# Patient Record
Sex: Female | Born: 1965
Health system: Southern US, Community
[De-identification: ages and names within clinical notes are randomized; demographics above are authoritative.]

## PROBLEM LIST (undated history)

## (undated) ENCOUNTER — Emergency Department (HOSPITAL_COMMUNITY): Admission: EM | Payer: Medicare Other

## (undated) DIAGNOSIS — K589 Irritable bowel syndrome without diarrhea: Secondary | ICD-10-CM

## (undated) DIAGNOSIS — I509 Heart failure, unspecified: Secondary | ICD-10-CM

## (undated) DIAGNOSIS — I10 Essential (primary) hypertension: Secondary | ICD-10-CM

## (undated) DIAGNOSIS — I471 Supraventricular tachycardia, unspecified: Secondary | ICD-10-CM

## (undated) DIAGNOSIS — F419 Anxiety disorder, unspecified: Secondary | ICD-10-CM

## (undated) DIAGNOSIS — F502 Bulimia nervosa, unspecified: Secondary | ICD-10-CM

## (undated) HISTORY — DX: Bulimia nervosa, unspecified: F50.20

## (undated) HISTORY — DX: Anxiety disorder, unspecified: F41.9

## (undated) HISTORY — DX: Supraventricular tachycardia, unspecified: I47.10

## (undated) HISTORY — DX: Supraventricular tachycardia: I47.1

## (undated) HISTORY — DX: Essential (primary) hypertension: I10

## (undated) HISTORY — PX: GUM SURGERY: SHX658

## (undated) HISTORY — PX: TUBAL LIGATION: SHX77

## (undated) HISTORY — DX: Irritable bowel syndrome, unspecified: K58.9

## (undated) HISTORY — DX: Bulimia nervosa: F50.2

---

## 1998-03-04 ENCOUNTER — Emergency Department (HOSPITAL_COMMUNITY): Admission: EM | Admit: 1998-03-04 | Discharge: 1998-03-05 | Payer: Self-pay

## 1998-05-02 ENCOUNTER — Emergency Department (HOSPITAL_COMMUNITY): Admission: EM | Admit: 1998-05-02 | Discharge: 1998-05-02 | Payer: Self-pay | Admitting: Emergency Medicine

## 1998-05-02 ENCOUNTER — Encounter: Payer: Self-pay | Admitting: Emergency Medicine

## 1999-09-03 ENCOUNTER — Emergency Department (HOSPITAL_COMMUNITY): Admission: EM | Admit: 1999-09-03 | Discharge: 1999-09-03 | Payer: Self-pay

## 2001-03-25 ENCOUNTER — Emergency Department (HOSPITAL_COMMUNITY): Admission: EM | Admit: 2001-03-25 | Discharge: 2001-03-26 | Payer: Self-pay | Admitting: *Deleted

## 2001-03-25 ENCOUNTER — Encounter: Payer: Self-pay | Admitting: Emergency Medicine

## 2001-10-30 ENCOUNTER — Emergency Department (HOSPITAL_COMMUNITY): Admission: EM | Admit: 2001-10-30 | Discharge: 2001-10-30 | Payer: Self-pay | Admitting: Emergency Medicine

## 2002-02-09 ENCOUNTER — Emergency Department (HOSPITAL_COMMUNITY): Admission: EM | Admit: 2002-02-09 | Discharge: 2002-02-09 | Payer: Self-pay | Admitting: Emergency Medicine

## 2002-10-14 ENCOUNTER — Encounter: Payer: Self-pay | Admitting: Emergency Medicine

## 2002-10-14 ENCOUNTER — Emergency Department (HOSPITAL_COMMUNITY): Admission: EM | Admit: 2002-10-14 | Discharge: 2002-10-15 | Payer: Self-pay | Admitting: Emergency Medicine

## 2002-11-19 ENCOUNTER — Inpatient Hospital Stay (HOSPITAL_COMMUNITY): Admission: EM | Admit: 2002-11-19 | Discharge: 2002-11-21 | Payer: Self-pay | Admitting: Emergency Medicine

## 2002-11-21 ENCOUNTER — Inpatient Hospital Stay (HOSPITAL_COMMUNITY): Admission: EM | Admit: 2002-11-21 | Discharge: 2002-11-25 | Payer: Self-pay | Admitting: Psychiatry

## 2003-02-04 ENCOUNTER — Emergency Department (HOSPITAL_COMMUNITY): Admission: EM | Admit: 2003-02-04 | Discharge: 2003-02-04 | Payer: Self-pay | Admitting: Emergency Medicine

## 2003-02-21 ENCOUNTER — Emergency Department (HOSPITAL_COMMUNITY): Admission: EM | Admit: 2003-02-21 | Discharge: 2003-02-21 | Payer: Self-pay | Admitting: Emergency Medicine

## 2005-01-16 ENCOUNTER — Emergency Department (HOSPITAL_COMMUNITY): Admission: EM | Admit: 2005-01-16 | Discharge: 2005-01-16 | Payer: Self-pay | Admitting: Emergency Medicine

## 2006-08-14 ENCOUNTER — Emergency Department (HOSPITAL_COMMUNITY): Admission: EM | Admit: 2006-08-14 | Discharge: 2006-08-14 | Payer: Self-pay | Admitting: Emergency Medicine

## 2007-11-15 ENCOUNTER — Emergency Department (HOSPITAL_COMMUNITY): Admission: EM | Admit: 2007-11-15 | Discharge: 2007-11-15 | Payer: Self-pay | Admitting: Emergency Medicine

## 2007-11-19 ENCOUNTER — Emergency Department (HOSPITAL_COMMUNITY): Admission: EM | Admit: 2007-11-19 | Discharge: 2007-11-19 | Payer: Self-pay | Admitting: Emergency Medicine

## 2008-11-07 ENCOUNTER — Observation Stay (HOSPITAL_COMMUNITY): Admission: EM | Admit: 2008-11-07 | Discharge: 2008-11-07 | Payer: Self-pay | Admitting: Emergency Medicine

## 2010-04-07 LAB — COMPREHENSIVE METABOLIC PANEL
ALT: 14 U/L (ref 0–35)
AST: 27 U/L (ref 0–37)
Albumin: 3.8 g/dL (ref 3.5–5.2)
Alkaline Phosphatase: 78 U/L (ref 39–117)
BUN: 9 mg/dL (ref 6–23)
CO2: 26 mEq/L (ref 19–32)
Calcium: 8.5 mg/dL (ref 8.4–10.5)
Chloride: 108 mEq/L (ref 96–112)
Creatinine, Ser: 0.71 mg/dL (ref 0.4–1.2)
GFR calc Af Amer: >60 mL/min (ref >60)
GFR calc non Af Amer: >60 mL/min (ref >60)
Glucose, Bld: 76 mg/dL (ref 70–99)
Potassium: 4.4 mEq/L (ref 3.5–5.1)
Sodium: 137 mEq/L (ref 135–145)
Total Bilirubin: 0.4 mg/dL (ref 0.3–1.2)
Total Protein: 6.6 g/dL (ref 6.0–8.3)

## 2010-04-07 LAB — POCT I-STAT, CHEM 8
BUN: 10 mg/dL (ref 6–23)
Calcium, Ion: 1.16 mmol/L (ref 1.12–1.32)
Glucose, Bld: 77 mg/dL (ref 70–99)
TCO2: 24 mmol/L (ref 0–100)

## 2010-04-07 LAB — POCT CARDIAC MARKERS
CKMB, poc: <1 ng/mL — ABNORMAL LOW (ref 1.0–8.0)
Myoglobin, poc: 51.7 ng/mL (ref 12–200)
Troponin i, poc: <0.05 ng/mL (ref 0.00–0.09)

## 2010-04-07 LAB — LIPASE, BLOOD: Lipase: 24 U/L (ref 11–59)

## 2010-04-07 LAB — D-DIMER, QUANTITATIVE: D-Dimer, Quant: <0.22 ug/mL-FEU (ref 0.00–0.48)

## 2010-05-21 NOTE — Discharge Summary (Signed)
Katherine Parker, HARTH NO.:  [id number]   MEDICAL RECORD NO.:  [id number]                   PATIENT TYPE:  IPS   LOCATION:  0501                                 FACILITY:  [facility]   PHYSICIAN:  Jeanice Lim, M.D.              DATE OF BIRTH:  22-Aug-1965   DATE OF ADMISSION:  11/21/2002  DATE OF DISCHARGE:  11/25/2002                                 DISCHARGE SUMMARY   IDENTIFYING DATA:  This is a 45 year old single African-American female who  presented with a history of intentional overdose on Seroquel.  Had been on  Zoloft and Seroquel and took an overdose of Zoloft 30 and Seroquel 25.  Feeling overwhelmed, financial stressors, isolated.  Intention was to kill  herself.  Sees Dr. Evelene Croon as an outpatient.  Has a long history of depression.   MEDICATIONS:  1. Seroquel.  2. Zoloft 100 mg b.i.d.   DRUG ALLERGIES:  No known drug allergies.   PHYSICAL EXAM:  Essentially within normal limits.  Neurologically nonfocal.   ROUTINE ADMISSION LABORATORIES:  Essentially within normal limits.   MENTAL STATUS:  The patient had no psychomotor abnormalities.  Fair eye  contact.  Speech was essentially within normal limits.  Mood depressed,  anxious, tearful, overwhelmed, helpless.  The patient reported being afraid  of everything.  The patient reported feeling terrible on the inside.  Reported passive suicidal ideation.  Cognition intact.  No psychotic  symptoms.  The patient was stabilized on medications including Ativan for  acute anxiety and Ambien to restore sleep.  The patient reported a positive  response.  The patient was agreeable to see a therapist and showed improved  coping skills and stabilization of mood and tolerated medication changes.  Her condition at discharge had improved, with improved coping skills.  No  dangerous ideation or psychotic symptoms.  Motivated to be compliant with  the aftercare plan and reach out for assistance with stressors.   DISCHARGE MEDICATIONS:  The patient was discharged on:  1. Geodon 60 mg b.i.d.  2. Ativan 0.5 mg, 1/2 q.a.m., 1/2 at 3 p.m., and 2 q.h.s.  3. Cymbalta 30 mg q.a.m.  4. Ambien 10 mg q.h.s. p.r.n.   DISPOSITION:  The patient was discharged to follow up with Dr. Evelene Croon on  Monday, November 30, 2002, at 9:15.   ADMITTING DIAGNOSES:  Same as admitting except global assessment of  functioning Axis V at discharge was 55.                                               Jeanice Lim, M.D.   JEM/MEDQ  D:  12/19/2002  T:  12/20/2002  Job:  [id number]

## 2010-05-21 NOTE — Discharge Summary (Signed)
Katherine Parker, Katherine Parker                             ACCOUNT NO.:  [id number]   MEDICAL RECORD NO.:  [id number]                   PATIENT TYPE:  INP   LOCATION:  0344                                 FACILITY:  [facility]   PHYSICIAN:  Jackie Plum, M.D.             DATE OF BIRTH:  August 21, 1965   DATE OF ADMISSION:  11/19/2002  DATE OF DISCHARGE:  11/20/2002                                 DISCHARGE SUMMARY   DISCHARGE DIAGNOSES:  1. Drug overdose.  2. Suicide attempt.  3. Depression.  4. Hypokalemia, ongoing treatment.   DISCHARGE MEDICATIONS:  1. Potassium 20 mEq p.o. b.i.d. for the next five days.  2. Seroquel and Zoloft, dosages per psychiatry.   CONSULTATIONS:  Psychiatry consult with Antonietta Breach, M.D.  He spent some  time today prior to discharge.   PROCEDURES:  Not applicable.   DISCHARGE LABORATORY DATA:  WBC 6.3, hemoglobin 11.3, hematocrit 34.5, MCV  80.3, platelet count 305.  Sodium 151, potassium 3.0, chloride 104, CO2 24,  glucose 99, BUN 7, creatinine 0.6.  Total bilirubin 0.7, alkaline  phosphatase 79, SGOT 17, SGPT 9, total protein 7.6, albumin 3.8, calcium  8.6.  Magnesium 1.9.  Acetaminophen level less than 10.0.  Salicylate level  less than 4.0.  Drug screen negative except for positive tricyclics.  Alcohol level 7.   Activity is as tolerated.   Diet to be regular.   REASON FOR HOSPITALIZATION:  Depression with suicide attempt/drug overdose.   The patient was admitted by Dr. Donnetta Hutching yesterday for 24-hour observation  after taking 30 tablets of Seroquel and 20 tablets of Zoloft because of some  problems with her husband.  The patient was lethargic with slurred speech at  the time of evaluation, and it was thought prudent to admit her for  observation prior to psychiatry evaluation after medical clearance.   HOSPITAL COURSE:  The patient was admitted to the hospitalist service.  She  received adequate supplementation with K-Dur for her mild hypokalemia.  Telemetry monitoring was unremarkable without any significant arrhythmias.  This morning the patient is fully awake, alert and oriented x4.  She is not  ill looking, and she does not have any complaints and she is medically  cleared for further evaluation by psychiatry.   The patient believes that her actions were wrong.  She feels that what she  did was unnecessary, and at the moment she does not have any suicidal  intent.  She has a mild hypokalemia and hyponatremia as mentioned above.  There is no indication for continued inpatient management, and she will need  a recheck of her electrolytes later on as an outpatient.  Jackie Plum, M.D.    GO/MEDQ  D:  11/20/2002  T:  11/20/2002  Job:  [id number]   cc:   Antonietta Breach, M.D.  [address]  [place], [place] [address]  Fax: [phone]   Milagros Evener, M.D.  [address] [address]  [place], [place] [address]  Fax: [phone]

## 2010-05-21 NOTE — H&P (Signed)
NAMETAWANIA, DAPONTE NO.:  [id number]   MEDICAL RECORD NO.:  [id number]                   PATIENT TYPE:  EMS   LOCATION:  ED                                   FACILITY:  [facility]   PHYSICIAN:  Sherin Quarry, MD                   DATE OF BIRTH:  1965-11-01   DATE OF ADMISSION:  11/19/2002  DATE OF DISCHARGE:                                HISTORY & PHYSICAL   HISTORY OF PRESENT ILLNESS:  Sheketa Ende is a 45 year old lady who reports a  long-standing history of depression.  The patient states that she recently  separated from her husband, and since that time has been feeling extremely  depressed and irritable.  She indicates that she has been thinking that life  is not worth living.  At about 2 p.m. today the patient took 30 Seroquel and  20 Zoloft tablets.  Her sister arrived shortly thereafter and EMS was  summoned, and the patient was brought to the [facility] Emergency Room  where she was administered activated charcoal.  Currently, she is lethargic  with slurred speech, but is alert enough to be able to answer questions  without difficulty.  She denies visual blurring, headache, shortness of  breath, cough, or chest pain.  She is admitted at this time for observation  and for management of her psychiatric problems.   ALLERGIES:  No known drug allergies.   CURRENT MEDICATIONS:  1. Seroquel 100 mg b.i.d.  2. Zoloft, which the patient states she is taking a dose of 200 mg b.i.d.   PAST MEDICAL HISTORY:  The patient states that she has a long history of  severe depression.  She states that she was hospitalized at [facility] about three years ago.   FAMILY HISTORY:  Noncontributory.   SOCIAL HISTORY:  She denies smoking or alcohol consumption.  She denies drug  abuse.   REVIEW OF SYSTEMS:  HEAD:  She denies headache or dizziness.  EYES:  She  denies visual blurring or diplopia.  EARS, NOSE, AND THROAT:  Denies  earache, sinus pain, or  sore throat.  CHEST:  Denies coughing, wheezing, or  chest congestion.  CARDIOVASCULAR:  Denies orthopnea, PND, or ankle edema.  GASTROINTESTINAL:  Denies nausea, vomiting, abdominal pain, change in bowel  habits, melena, or hematochezia.  GENITOURINARY:  Denies dysuria, urinary  frequency, hesitancy, or nocturia.  RHEUMATOLOGIC:  Denies back pain or  joint pain.  HEMATOLOGIC:  Denies easy bleeding or bruising.  NEUROLOGIC:  Denies history of seizure or stroke.   PHYSICAL EXAMINATION:  GENERAL:  She is an obese cooperative lady who is  somewhat sleepy.  VITAL SIGNS:  Blood pressure is 156/97, temperature is 99, pulse is 63,  respirations 20.  HEENT:  Within normal limits.  CHEST:  Clear.  BACK:  No CVA or point tenderness.  CARDIOVASCULAR:  Normal S1  and S2 without murmurs, rubs, or gallops.  ABDOMEN:  Benign.  Normal bowel sounds without masses, tenderness, or  organomegaly.  NEUROLOGIC:  Normal.  EXTREMITIES:  Normal.   IMPRESSION:  1. Drug overdose.  2. Depression.  3. Hypokalemia on laboratory studies.  It should be pointed out that the     patient's blood testing was negative for acetaminophen and salicylates.   PLAN:  Admit her for observation, withhold her psychiatric medications, to  replete her potassium level, and to request consultation with Dr. Jeanie Sewer  in the a.m.                                               Sherin Quarry, MD    SY/MEDQ  D:  11/19/2002  T:  11/19/2002  Job:  [id number]   cc:   Milagros Evener, M.D.  [address] [address]  [place], [place] [address]  Fax: [phone]

## 2010-10-05 LAB — POCT PREGNANCY, URINE: Preg Test, Ur: NEGATIVE

## 2010-10-05 LAB — BASIC METABOLIC PANEL
BUN: 6
Calcium: 9.4
Creatinine, Ser: 0.74
GFR calc non Af Amer: >60
Glucose, Bld: 80
Potassium: 3.2 — ABNORMAL LOW

## 2010-10-05 LAB — DIFFERENTIAL
Basophils Absolute: 0.1
Eosinophils Relative: 1
Lymphocytes Relative: 42
Monocytes Relative: 8
Neutrophils Relative %: 48

## 2010-10-05 LAB — POCT CARDIAC MARKERS
CKMB, poc: 1
Troponin i, poc: <0.05

## 2010-10-05 LAB — CBC
Platelets: 204
RDW: 15.1
WBC: 5.1

## 2017-05-22 ENCOUNTER — Other Ambulatory Visit (HOSPITAL_COMMUNITY)
Admission: RE | Admit: 2017-05-22 | Discharge: 2017-05-22 | Disposition: A | Discharge status: Home / Self Care | Payer: Medicare Other | Source: Ambulatory Visit | Attending: Family Medicine | Admitting: Family Medicine

## 2017-05-22 ENCOUNTER — Other Ambulatory Visit: Payer: Self-pay | Admitting: Family Medicine

## 2017-05-22 DIAGNOSIS — Z124 Encounter for screening for malignant neoplasm of cervix: Secondary | ICD-10-CM | POA: Insufficient documentation

## 2017-05-24 LAB — CYTOLOGY - PAP
DIAGNOSIS: NEGATIVE
HPV: NOT DETECTED

## 2017-05-31 ENCOUNTER — Other Ambulatory Visit: Payer: Self-pay | Admitting: Family Medicine

## 2017-05-31 DIAGNOSIS — Z1231 Encounter for screening mammogram for malignant neoplasm of breast: Secondary | ICD-10-CM

## 2017-07-03 ENCOUNTER — Ambulatory Visit: Payer: Medicare Other

## 2018-07-04 ENCOUNTER — Other Ambulatory Visit: Payer: Self-pay | Admitting: *Deleted

## 2018-07-04 DIAGNOSIS — Z20822 Contact with and (suspected) exposure to covid-19: Secondary | ICD-10-CM

## 2018-07-09 LAB — NOVEL CORONAVIRUS, NAA: SARS-CoV-2, NAA: NOT DETECTED

## 2018-09-22 ENCOUNTER — Other Ambulatory Visit: Payer: Self-pay

## 2018-09-22 DIAGNOSIS — Z20822 Contact with and (suspected) exposure to covid-19: Secondary | ICD-10-CM

## 2018-09-23 LAB — NOVEL CORONAVIRUS, NAA: SARS-CoV-2, NAA: NOT DETECTED

## 2018-09-26 ENCOUNTER — Telehealth: Payer: Self-pay | Admitting: General Practice

## 2018-09-26 NOTE — Telephone Encounter (Signed)
Negative COVID results given. Patient results "NOT Detected." Caller expressed understanding. ° °

## 2019-09-27 ENCOUNTER — Ambulatory Visit: Payer: Medicare Other | Admitting: Podiatry

## 2020-02-15 ENCOUNTER — Emergency Department (HOSPITAL_COMMUNITY): Payer: Medicare Other

## 2020-02-15 ENCOUNTER — Other Ambulatory Visit: Payer: Self-pay

## 2020-02-15 ENCOUNTER — Encounter (HOSPITAL_COMMUNITY): Payer: Self-pay

## 2020-02-15 ENCOUNTER — Emergency Department (HOSPITAL_COMMUNITY)
Admission: EM | Admit: 2020-02-15 | Discharge: 2020-02-16 | Disposition: A | Discharge status: Home / Self Care | Payer: Medicare Other | Attending: Emergency Medicine | Admitting: Emergency Medicine

## 2020-02-15 DIAGNOSIS — R079 Chest pain, unspecified: Secondary | ICD-10-CM | POA: Diagnosis not present

## 2020-02-15 DIAGNOSIS — R0789 Other chest pain: Secondary | ICD-10-CM | POA: Insufficient documentation

## 2020-02-15 DIAGNOSIS — R0602 Shortness of breath: Secondary | ICD-10-CM | POA: Insufficient documentation

## 2020-02-15 DIAGNOSIS — M545 Low back pain, unspecified: Secondary | ICD-10-CM | POA: Insufficient documentation

## 2020-02-15 DIAGNOSIS — K449 Diaphragmatic hernia without obstruction or gangrene: Secondary | ICD-10-CM | POA: Diagnosis not present

## 2020-02-15 DIAGNOSIS — R1013 Epigastric pain: Secondary | ICD-10-CM | POA: Insufficient documentation

## 2020-02-15 DIAGNOSIS — J9811 Atelectasis: Secondary | ICD-10-CM | POA: Diagnosis not present

## 2020-02-15 DIAGNOSIS — I517 Cardiomegaly: Secondary | ICD-10-CM | POA: Diagnosis not present

## 2020-02-15 LAB — CBC
HCT: 38.3 % (ref 36.0–46.0)
Hemoglobin: 11.8 g/dL — ABNORMAL LOW (ref 12.0–15.0)
MCH: 26.8 pg (ref 26.0–34.0)
MCHC: 30.8 g/dL (ref 30.0–36.0)
MCV: 87 fL (ref 80.0–100.0)
Platelets: 262 10*3/uL (ref 150–400)
RBC: 4.4 MIL/uL (ref 3.87–5.11)
RDW: 13.5 % (ref 11.5–15.5)
WBC: 5.8 10*3/uL (ref 4.0–10.5)
nRBC: 0 % (ref 0.0–0.2)

## 2020-02-15 LAB — BASIC METABOLIC PANEL
Anion gap: 10 (ref 5–15)
BUN: 15 mg/dL (ref 6–20)
CO2: 26 mmol/L (ref 22–32)
Calcium: 9.5 mg/dL (ref 8.9–10.3)
Chloride: 105 mmol/L (ref 98–111)
Creatinine, Ser: 0.81 mg/dL (ref 0.44–1.00)
GFR, Estimated: >60 mL/min (ref >60)
Glucose, Bld: 104 mg/dL — ABNORMAL HIGH (ref 70–99)
Potassium: 4.2 mmol/L (ref 3.5–5.1)
Sodium: 141 mmol/L (ref 135–145)

## 2020-02-15 LAB — HEPATIC FUNCTION PANEL
ALT: 8 U/L (ref 0–44)
AST: 16 U/L (ref 15–41)
Albumin: 3.7 g/dL (ref 3.5–5.0)
Alkaline Phosphatase: 79 U/L (ref 38–126)
Bilirubin, Direct: 0.1 mg/dL (ref 0.0–0.2)
Indirect Bilirubin: 0.5 mg/dL (ref 0.3–0.9)
Total Bilirubin: 0.6 mg/dL (ref 0.3–1.2)
Total Protein: 7.1 g/dL (ref 6.5–8.1)

## 2020-02-15 LAB — TROPONIN I (HIGH SENSITIVITY): Troponin I (High Sensitivity): 3 ng/L (ref <18)

## 2020-02-15 LAB — LIPASE, BLOOD: Lipase: 31 U/L (ref 11–51)

## 2020-02-15 LAB — I-STAT BETA HCG BLOOD, ED (NOT ORDERABLE): I-stat hCG, quantitative: <5 m[IU]/mL (ref <5)

## 2020-02-15 MED ORDER — PANTOPRAZOLE SODIUM 40 MG IV SOLR
40.0000 mg | Freq: Once | INTRAVENOUS | Status: AC
  Administered 2020-02-15: 40 mg via INTRAVENOUS
  Filled 2020-02-15: qty 40

## 2020-02-15 MED ORDER — KETOROLAC TROMETHAMINE 30 MG/ML IJ SOLN
30.0000 mg | Freq: Once | INTRAMUSCULAR | Status: AC
  Administered 2020-02-15: 30 mg via INTRAVENOUS
  Filled 2020-02-15: qty 1

## 2020-02-15 NOTE — ED Provider Notes (Signed)
[facility]-EMERGENCY DEPT Provider Note   CSN: [id number] Arrival date & time: 02/15/20  1911     History Chief Complaint  Patient presents with  . Chest Pain  . Shortness of Breath    Katherine Parker is a 55 y.o. female.  HPI Reports he has been having pain in her chest for about 3 days. She notes that it first she seemed to have pain in her back although she points out that it was her lower back. No radiation into her abdomen. No pain burning urgency with urination. She reports subsequent to that then she started noting pain in her chest which is not typical. She reports that she thought it might be heartburn. She reports the pain is in the center of her chest and moves out slightly to the right. She reports she has had some pain similar in the past and taken Tums infrequently. She reports this time the pain seems to be persisting a lot more and she is feeling short of breath with the pain. No fevers or cough. She does not have any pain in her legs. She reports this is been going on since she took a trip to help her mother moved to this area. She has no personal history of DVT or PE.    History reviewed. No pertinent past medical history.  There are no problems to display for this patient.   History reviewed. No pertinent surgical history.   OB History   No obstetric history on file.     History reviewed. No pertinent family history.     Home Medications Prior to Admission medications   Medication Sig Start Date End Date Taking? Authorizing Provider  albuterol (VENTOLIN HFA) 108 (90 Base) MCG/ACT inhaler Inhale 2 puffs into the lungs every 6 (six) hours as needed for wheezing or shortness of breath. 05/22/17  Yes [provider]    Allergies    Penicillins  Review of Systems   Review of Systems 10 systems reviewed and negative except as per HPI Physical Exam Updated Vital Signs BP (!) 151/93   Pulse 90   Temp 98.4 F (36.9 C) (Oral)    Resp 18   Ht 5\' 6"  (1.676 m)   Wt (!) 167.8 kg   SpO2 98%   BMI 59.72 kg/m   Physical Exam Constitutional:      Appearance: She is well-developed and well-nourished.     Comments: Patient is alert and nontoxic. No respiratory distress  HENT:     Head: Normocephalic and atraumatic.  Eyes:     Extraocular Movements: Extraocular movements intact and EOM normal.  Cardiovascular:     Rate and Rhythm: Normal rate and regular rhythm.     Pulses: Intact distal pulses.     Heart sounds: Normal heart sounds.  Pulmonary:     Effort: Pulmonary effort is normal.     Breath sounds: Normal breath sounds.     Comments: Patient does have significant tenderness to palpation along the sternal costal margins. No soft tissue abnormalities Chest:     Chest wall: Tenderness present.  Abdominal:     General: Bowel sounds are normal. There is no distension.     Palpations: Abdomen is soft.     Tenderness: There is abdominal tenderness.     Comments: Mild to moderate tenderness in the epigastrium. No guarding.  Musculoskeletal:        General: No swelling, tenderness or edema. Normal range of motion.  Cervical back: Neck supple.     Comments: No obvious edema of the lower extremities. Calves pliable  Skin:    General: Skin is warm, dry and intact.  Neurological:     General: No focal deficit present.     Mental Status: She is alert and oriented to person, place, and time.     GCS: GCS eye subscore is 4. GCS verbal subscore is 5. GCS motor subscore is 6.     Coordination: Coordination normal.     Deep Tendon Reflexes: Strength normal.  Psychiatric:        Mood and Affect: Mood and affect and mood normal.     ED Results / Procedures / Treatments   Labs (all labs ordered are listed, but only abnormal results are displayed) Labs Reviewed  BASIC METABOLIC PANEL - Abnormal; Notable for the following components:      Result Value   Glucose, Bld 104 (*)    All other components within normal  limits  CBC - Abnormal; Notable for the following components:   Hemoglobin 11.8 (*)    All other components within normal limits  LIPASE, BLOOD  HEPATIC FUNCTION PANEL  I-STAT BETA HCG BLOOD, ED ([facility], [facility], [facility] ONLY)  I-STAT BETA HCG BLOOD, ED (NOT ORDERABLE)  TROPONIN I (HIGH SENSITIVITY)    EKG EKG Interpretation  Date/Time:  Saturday February 15 2020 19:20:17 EST Ventricular Rate:  102 PR Interval:    QRS Duration: 89 QT Interval:  332 QTC Calculation: 433 R Axis:   32 Text Interpretation: Sinus tachycardia Biatrial enlargement Probable left ventricular hypertrophy similar to previous with increase appearance of LVH Confirmed by Arby Barrette [phone]) on 02/15/2020 9:56:09 PM   Radiology DG Chest 2 View  Result Date: 02/15/2020 CLINICAL DATA:  Chest pain EXAM: CHEST - 2 VIEW COMPARISON:  11/19/2007 FINDINGS: Cardiomegaly. Both lungs are clear. The visualized skeletal structures are unremarkable. IMPRESSION: Cardiomegaly without acute abnormality of the lungs. Electronically Signed   By: Lauralyn Primes M.D.   On: 02/15/2020 19:46    Procedures Procedures   Medications Ordered in ED Medications  pantoprazole (PROTONIX) injection 40 mg (40 mg Intravenous Given 02/15/20 2310)  ketorolac (TORADOL) 30 MG/ML injection 30 mg (30 mg Intravenous Given 02/15/20 2309)    ED Course  I have reviewed the triage vital signs and the nursing notes.  Pertinent labs & imaging results that were available during my care of the patient were reviewed by me and considered in my medical decision making (see chart for details).    MDM Rules/Calculators/A&P                          Patient presents with about 3 days of chest pain. She does note associated shortness of breath. No fevers or chills. Pain also has a quality of burning in the epigastrium. We will proceed with cardiac work-up and GI work-up. EKG does not show STEMI pattern. Initially documented blood pressure significantly elevated however  this was repeated in the room with manual cuff with borderline hypertension but not significantly elevated. Given associated shortness of breath and some association of symptoms since a episode of travel, if no GI findings of elevated lipase or hepatic function plan to proceed with CT PE study. Patient was given Toradol and Protonix. She is rechecked and feeling improvement. If all diagnostic study without abnormality, anticipate discharge with close follow-up with PCP. Would start patient on PPI. Final Clinical Impression(s) / ED Diagnoses  Final diagnoses:  Atypical chest pain    Rx / DC Orders ED Discharge Orders    None       Arby Barrette, MD 02/15/20 2359

## 2020-02-15 NOTE — ED Triage Notes (Signed)
Pt reports chest pain and SHOB x3 days. Pt states the pain began in her back and no radiates down her arms. Pt also reports some diarrhea. Pt denies N/V and abdominal pain.

## 2020-02-16 ENCOUNTER — Encounter (HOSPITAL_COMMUNITY): Payer: Self-pay

## 2020-02-16 ENCOUNTER — Emergency Department (HOSPITAL_COMMUNITY): Payer: Medicare Other

## 2020-02-16 DIAGNOSIS — K449 Diaphragmatic hernia without obstruction or gangrene: Secondary | ICD-10-CM | POA: Diagnosis not present

## 2020-02-16 DIAGNOSIS — J9811 Atelectasis: Secondary | ICD-10-CM | POA: Diagnosis not present

## 2020-02-16 DIAGNOSIS — I517 Cardiomegaly: Secondary | ICD-10-CM | POA: Diagnosis not present

## 2020-02-16 MED ORDER — IOHEXOL 350 MG/ML SOLN
100.0000 mL | Freq: Once | INTRAVENOUS | Status: AC | PRN
  Administered 2020-02-16: 100 mL via INTRAVENOUS

## 2020-02-16 NOTE — ED Provider Notes (Signed)
89 yoF with a chief complaint of chest pain.  I received the patient in signout from Dr. Clarice Pole.  Concern for PE plan for CT angiogram of the chest.  If negative likely discharge home with PCP follow-up.  CTa negative for PE.  Signs of heart failure both right and left sided.  No concern for an acute heart failure exacerbation on my exam.  We will have the patient follow-up with her family doctor.   Melene Plan, DO 02/16/20 [phone]

## 2020-02-16 NOTE — Discharge Instructions (Signed)
There was no blood clot seen in your lung.  Follow up with your family doctor.  There were signs of heart failure on CT.  This is something that needs to be follow up as an outpatient.  Please discuss with your family doc.   Try pepcid or tagamet up to twice a day.  Try to avoid things that may make this worse, most commonly these are spicy foods tomato based products fatty foods chocolate and peppermint.  Alcohol and tobacco can also make this worse.  Return to the emergency department for sudden worsening pain fever or inability to eat or drink.

## 2020-03-09 ENCOUNTER — Ambulatory Visit: Payer: Medicare Other | Admitting: Podiatry

## 2020-03-09 ENCOUNTER — Other Ambulatory Visit: Payer: Self-pay

## 2020-03-09 ENCOUNTER — Encounter: Payer: Self-pay | Admitting: Podiatry

## 2020-03-09 DIAGNOSIS — E785 Hyperlipidemia, unspecified: Secondary | ICD-10-CM | POA: Insufficient documentation

## 2020-03-09 DIAGNOSIS — M2142 Flat foot [pes planus] (acquired), left foot: Secondary | ICD-10-CM

## 2020-03-09 DIAGNOSIS — G4700 Insomnia, unspecified: Secondary | ICD-10-CM | POA: Insufficient documentation

## 2020-03-09 DIAGNOSIS — J4520 Mild intermittent asthma, uncomplicated: Secondary | ICD-10-CM | POA: Insufficient documentation

## 2020-03-09 DIAGNOSIS — J45909 Unspecified asthma, uncomplicated: Secondary | ICD-10-CM | POA: Insufficient documentation

## 2020-03-09 DIAGNOSIS — E7800 Pure hypercholesterolemia, unspecified: Secondary | ICD-10-CM | POA: Insufficient documentation

## 2020-03-09 DIAGNOSIS — B351 Tinea unguium: Secondary | ICD-10-CM | POA: Diagnosis not present

## 2020-03-09 DIAGNOSIS — L853 Xerosis cutis: Secondary | ICD-10-CM

## 2020-03-09 DIAGNOSIS — M2141 Flat foot [pes planus] (acquired), right foot: Secondary | ICD-10-CM | POA: Diagnosis not present

## 2020-03-09 DIAGNOSIS — M79674 Pain in right toe(s): Secondary | ICD-10-CM | POA: Diagnosis not present

## 2020-03-09 DIAGNOSIS — M79675 Pain in left toe(s): Secondary | ICD-10-CM

## 2020-03-09 DIAGNOSIS — F331 Major depressive disorder, recurrent, moderate: Secondary | ICD-10-CM | POA: Insufficient documentation

## 2020-03-09 NOTE — Patient Instructions (Signed)
Apply Vick's Vapor Rub to toenails once daily.  For normal skin: Moisturize feet once daily; do not apply between toes A.  CeraVe Daily Moisturizing Lotion B.  Vaseline Intensive Care Lotion C.  Lubriderm Lotion D.  Gold Bond Diabetic Foot Lotion E.  Eucerin Intensive Repair Moisturizing Lotion  For extremely dry, cracked feet: moisturize feet once daily; do not apply between toes A. CeraVe Healing Ointment B. Aquaphor Healing Ointment C. Vaseline Petroleum Healing Jelly   If you have problems reaching your feet: apply to feet once daily; do not apply between toes A.  Aquaphor Advanced Therapy Ointment Body Spray B.  Vaseline Intensive Care Spray Lotion Advanced Repair

## 2020-03-15 ENCOUNTER — Encounter: Payer: Self-pay | Admitting: Podiatry

## 2020-03-15 NOTE — Progress Notes (Signed)
Subjective: Katherine Parker presents today referred by [facility] [place] for complaint of painful thick toenails that are difficult to trim. Pain interferes with ambulation. Aggravating factors include wearing enclosed shoe gear. Pain is relieved with periodic professional debridement..   History reviewed. No pertinent past medical history.   Patient Active Problem List   Diagnosis Date Noted  . Asthma 03/09/2020  . Hypercholesterolemia 03/09/2020  . Insomnia 03/09/2020  . Mild intermittent asthma 03/09/2020  . Moderate recurrent major depression ([place]) 03/09/2020     History reviewed. No pertinent surgical history.   Current Outpatient Medications on File Prior to Visit  Medication Sig Dispense Refill  . benzonatate (TESSALON PERLES) 100 MG capsule 1 capsule as needed    . buPROPion (WELLBUTRIN XL) 150 MG 24 hr tablet 1 tablet in the morning    . cetirizine (ZYRTEC ALLERGY) 10 MG tablet 1 tablet    . hydrOXYzine (ATARAX/VISTARIL) 25 MG tablet 1 tablet as needed    . triamcinolone (KENALOG) 0.1 % 1 application    . albuterol (VENTOLIN HFA) 108 (90 Base) MCG/ACT inhaler Inhale 2 puffs into the lungs every 6 (six) hours as needed for wheezing or shortness of breath.    Marland Kitchen COVID-19 Specimen Collection KIT TEST AS DIRECTED TODAY    . dextromethorphan (DELSYM) 30 MG/5ML liquid 10 ml as needed     No current facility-administered medications on file prior to visit.     Allergies  Allergen Reactions  . Penicillin G     Other reaction(s): Unknown  . Penicillins     Did it involve swelling of the face/tongue/throat, SOB, or low BP? Unknown Did it involve sudden or severe rash/hives, skin peeling, or any reaction on the inside of your mouth or nose? Unknown Did you need to seek medical attention at a hospital or doctor's office? Unknown When did it last happen?Childhood If all above answers are "NO", may proceed with cephalosporin use.       Social History    Occupational History  . Not on file  Tobacco Use  . Smoking status: Never Smoker  . Smokeless tobacco: Never Used  Substance and Sexual Activity  . Alcohol use: Not Currently  . Drug use: Not on file  . Sexual activity: Not on file     History reviewed. No pertinent family history.    There is no immunization history on file for this patient.   Objective: Katherine Parker is a pleasant 55 y.o. female in NAD. AAO x 3.  There were no vitals filed for this visit.  Vascular Examination:  Capillary refill time to digits immediate b/l. Palpable pedal pulses b/l LE. Pedal hair sparse. Lower extremity skin temperature gradient within normal limits.  Dermatological Examination: Pedal skin with normal turgor, texture and tone bilaterally. No open wounds bilaterally. No interdigital macerations bilaterally. Toenails 1-5 b/l elongated, discolored, dystrophic, thickened, crumbly with subungual debris and tenderness to dorsal palpation.  Musculoskeletal: Normal muscle strength 5/5 to all lower extremity muscle groups bilaterally. No pain crepitus or joint limitation noted with ROM b/l. Pes planus deformity noted b/l.   Neurological: Protective sensation intact 5/5 intact bilaterally with 10g monofilament b/l. Vibratory sensation intact b/l.  Assessment: 1. Pain due to onychomycosis of toenails of both feet   2. Xerosis cutis   3. Pes planus of both feet     Plan: -Examined patient. -Patient to continue soft, supportive shoe gear daily. -Discussed treatment options for onychomycosis. Patient opted for topical OTC therapy. Patient  is to apply Vick's Vapor Rub to affected toenail(s) once daily. -Toenails 1-5 b/l were debrided in length and girth with sterile nail nippers and dremel without iatrogenic bleeding.  -Patient to report any pedal injuries to medical professional immediately. -For dry skin, patient was given written list of OTC moisturizers. Patient/POA instructed to apply to  foot/feet once daily avoiding application between toes.  -Patient/POA to call should there be question/concern in the interim.  Return in about 3 months (around 06/09/2020).  Marzetta Board, DPM

## 2020-06-04 ENCOUNTER — Other Ambulatory Visit: Payer: Self-pay

## 2020-06-05 ENCOUNTER — Ambulatory Visit (INDEPENDENT_AMBULATORY_CARE_PROVIDER_SITE_OTHER): Payer: Medicare Other | Admitting: Family Medicine

## 2020-06-05 ENCOUNTER — Encounter: Payer: Self-pay | Admitting: Family Medicine

## 2020-06-05 VITALS — BP 130/90 | HR 85 | Temp 98.1°F | Ht 66.0 in | Wt 340.4 lb

## 2020-06-05 DIAGNOSIS — R6889 Other general symptoms and signs: Secondary | ICD-10-CM

## 2020-06-05 DIAGNOSIS — R5383 Other fatigue: Secondary | ICD-10-CM

## 2020-06-05 DIAGNOSIS — Z6841 Body Mass Index (BMI) 40.0 and over, adult: Secondary | ICD-10-CM | POA: Diagnosis not present

## 2020-06-05 DIAGNOSIS — I517 Cardiomegaly: Secondary | ICD-10-CM | POA: Diagnosis not present

## 2020-06-05 DIAGNOSIS — M549 Dorsalgia, unspecified: Secondary | ICD-10-CM

## 2020-06-05 MED ORDER — TIZANIDINE HCL 4 MG PO CAPS: 4.0000 mg | ORAL_CAPSULE | Freq: Three times a day (TID) | ORAL | 0 refills | Status: DC | PRN

## 2020-06-05 MED ORDER — NAPROXEN 500 MG PO TABS: 500.0000 mg | ORAL_TABLET | Freq: Two times a day (BID) | ORAL | 2 refills | Status: DC

## 2020-06-05 NOTE — Progress Notes (Signed)
Katherine Parker is a 55 y.o. female  Chief Complaint  Patient presents with  . Establish Care    Np c/o back pain.  Pt also said the she went to Hospital in Feb and was told she had CHF and wanted to discuss that dx.  Pt wants to discuss bypass surgery.    HPI: Katherine Parker is a 55 y.o. female patient seen today to establish care. She has multiple concerns today: 1. BMI above 50. She would like referral to gastric surgery.  2. Was told in [place] that she had an enlarged heart. She was then seen in ER in 02/2020 for chest pain and r/o PE. CTA showed dilated chambers and dilated pulm artery. Pt endorses LE edema. + fatigue, decreased exercise tolerance. No SOB at rest. No orthopnea.  Had covid in 03/2019 and was told she needed to be hospitalized but pt did not do so because she was caring for her mother.   3. Pt with intermittent low back pain x 1 year. Sharp, unbearable pain. No radiation of pain and no LE numbness or paresthesias. 2 episodes in the past year. She bends forward or leans sideways. Lasts 3-4 days then eases off. She takes motrin or ibuprofen.    History reviewed. No pertinent past medical history.  Past Surgical History:  Procedure Laterality Date  . TUBAL LIGATION      Social History   Socioeconomic History  . Marital status: Single    Spouse name: Not on file  . Number of children: Not on file  . Years of education: Not on file  . Highest education level: Not on file  Occupational History  . Not on file  Tobacco Use  . Smoking status: Never Smoker  . Smokeless tobacco: Never Used  Vaping Use  . Vaping Use: Never used  Substance and Sexual Activity  . Alcohol use: Not Currently  . Drug use: Not on file  . Sexual activity: Not on file  Other Topics Concern  . Not on file  Social History Narrative  . Not on file   Social Determinants of Health   Financial Resource Strain: Not on file  Food Insecurity: Not on file  Transportation Needs: Not on file   Physical Activity: Not on file  Stress: Not on file  Social Connections: Not on file  Intimate Partner Violence: Not on file    Family History  Problem Relation Age of Onset  . COPD Mother   . Congestive Heart Failure Mother   . Hypertension Mother   . Diabetes Mother   . Colon cancer Maternal Grandmother       There is no immunization history on file for this patient.  Outpatient Encounter Medications as of 06/05/2020  Medication Sig  . albuterol (VENTOLIN HFA) 108 (90 Base) MCG/ACT inhaler Inhale 2 puffs into the lungs every 6 (six) hours as needed for wheezing or shortness of breath.  . benzonatate (TESSALON) 100 MG capsule 1 capsule as needed (Patient not taking: Reported on 06/05/2020)  . buPROPion (WELLBUTRIN XL) 150 MG 24 hr tablet 1 tablet in the morning (Patient not taking: Reported on 06/05/2020)  . cetirizine (ZYRTEC) 10 MG tablet 1 tablet (Patient not taking: Reported on 06/05/2020)  . COVID-19 Specimen Collection KIT TEST AS DIRECTED TODAY (Patient not taking: Reported on 06/05/2020)  . dextromethorphan (DELSYM) 30 MG/5ML liquid 10 ml as needed (Patient not taking: Reported on 06/05/2020)  . hydrOXYzine (ATARAX/VISTARIL) 25 MG tablet 1 tablet as needed (Patient not  taking: Reported on 06/05/2020)  . triamcinolone (KENALOG) 0.1 % 1 application (Patient not taking: Reported on 06/05/2020)   No facility-administered encounter medications on file as of 06/05/2020.     ROS: Pertinent positives and negatives noted in HPI. Remainder of ROS non-contributory    Allergies  Allergen Reactions  . Penicillin G     Other reaction(s): Unknown  . Penicillins     Did it involve swelling of the face/tongue/throat, SOB, or low BP? Unknown Did it involve sudden or severe rash/hives, skin peeling, or any reaction on the inside of your mouth or nose? Unknown Did you need to seek medical attention at a hospital or doctor's office? Unknown When did it last happen?Childhood If all above  answers are "NO", may proceed with cephalosporin use.      BP 130/90 (BP Location: Left Arm, Patient Position: Sitting, Cuff Size: Large)   Pulse 85   Temp 98.1 F (36.7 C) (Temporal)   Ht _0  (1.676 m)   Wt (!) 340 lb 6.4 oz (154.4 kg)   SpO2 98%   BMI 54.94 kg/m   Wt Readings from Last 3 Encounters:  06/05/20 (!) 340 lb 6.4 oz (154.4 kg)  02/15/20 (!) 370 lb (167.8 kg)   Temp Readings from Last 3 Encounters:  06/05/20 98.1 F (36.7 C) (Temporal)  02/16/20 98 F (36.7 C) (Oral)   BP Readings from Last 3 Encounters:  06/05/20 130/90  02/16/20 (!) 155/97   Pulse Readings from Last 3 Encounters:  06/05/20 85  02/16/20 72     Physical Exam Constitutional:      General: She is not in acute distress.    Appearance: She is obese. She is not ill-appearing.  Cardiovascular:     Rate and Rhythm: Normal rate and regular rhythm.     Pulses: Normal pulses.  Pulmonary:     Effort: Pulmonary effort is normal. No respiratory distress.     Breath sounds: Normal breath sounds.  Neurological:     Mental Status: She is alert and oriented to person, place, and time.  Psychiatric:        Mood and Affect: Mood normal.        Behavior: Behavior normal.      A/P:  1. Cardiomegaly 2. Decreased exercise tolerance 3. Fatigue, unspecified type - ECHOCARDIOGRAM COMPLETE; Future  4. BMI 50.0-59.9, adult ([place]) 5. Morbid obesity ([place]) - Amb Referral to Bariatric Surgery  6. Musculoskeletal back pain - 2 episodes in the past 1 year, lasting 3-4 days each - recommend daily exercises to help strengthen lower back. Reviewed with pt and included in AVS - heat PRN Rx: - naproxen (NAPROSYN) 500 MG tablet; Take 1 tablet (500 mg total) by mouth 2 (two) times daily with a meal.  Dispense: 60 tablet; Refill: 2 - tiZANidine (ZANAFLEX) 4 MG capsule; Take 1 capsule (4 mg total) by mouth 3 (three) times daily as needed for muscle spasms.  Dispense: 60 capsule; Refill: 0 Discussed plan and  reviewed medications with patient, including risks, benefits, and potential side effects. Pt expressed understand. All questions answered.    This visit occurred during the SARS-CoV-2 public health emergency.  Safety protocols were in place, including screening questions prior to the visit, additional usage of staff PPE, and extensive cleaning of exam room while observing appropriate contact time as indicated for disinfecting solutions.

## 2020-06-05 NOTE — Patient Instructions (Addendum)
Low back exercises - daily Heating pad 2-3x/day 15-67min Naproxen 500mg  1 tab 2x/day with food x 3-7 days zanaflex 1 tab 3x/day as needed  Low Back Sprain or Strain Rehab Ask your health care provider which exercises are safe for you. Do exercises exactly as told by your health care provider and adjust them as directed. It is normal to feel mild stretching, pulling, tightness, or discomfort as you do these exercises. Stop right away if you feel sudden pain or your pain gets worse. Do not begin these exercises until told by your health care provider. Stretching and range-of-motion exercises These exercises warm up your muscles and joints and improve the movement and flexibility of your back. These exercises also help to relieve pain, numbness, and tingling. Lumbar rotation 1. Lie on your back on a firm surface and bend your knees. 2. Straighten your arms out to your sides so each arm forms a 90-degree angle (right angle) with a side of your body. 3. Slowly move (rotate) both of your knees to one side of your body until you feel a stretch in your lower back (lumbar). Try not to let your shoulders lift off the floor. 4. Hold this position for __________ seconds. 5. Tense your abdominal muscles and slowly move your knees back to the starting position. 6. Repeat this exercise on the other side of your body. Repeat __________ times. Complete this exercise __________ times a day.   Single knee to chest 1. Lie on your back on a firm surface with both legs straight. 2. Bend one of your knees. Use your hands to move your knee up toward your chest until you feel a gentle stretch in your lower back and buttock. ? Hold your leg in this position by holding on to the front of your knee. ? Keep your other leg as straight as possible. 3. Hold this position for __________ seconds. 4. Slowly return to the starting position. 5. Repeat with your other leg. Repeat __________ times. Complete this exercise  __________ times a day.   Prone extension on elbows 1. Lie on your abdomen on a firm surface (prone position). 2. Prop yourself up on your elbows. 3. Use your arms to help lift your chest up until you feel a gentle stretch in your abdomen and your lower back. ? This will place some of your body weight on your elbows. If this is uncomfortable, try stacking pillows under your chest. ? Your hips should stay down, against the surface that you are lying on. Keep your hip and back muscles relaxed. 4. Hold this position for __________ seconds. 5. Slowly relax your upper body and return to the starting position. Repeat __________ times. Complete this exercise __________ times a day.   Strengthening exercises These exercises build strength and endurance in your back. Endurance is the ability to use your muscles for a long time, even after they get tired. Pelvic tilt This exercise strengthens the muscles that lie deep in the abdomen. 1. Lie on your back on a firm surface. Bend your knees and keep your feet flat on the floor. 2. Tense your abdominal muscles. Tip your pelvis up toward the ceiling and flatten your lower back into the floor. ? To help with this exercise, you may place a small towel under your lower back and try to push your back into the towel. 3. Hold this position for __________ seconds. 4. Let your muscles relax completely before you repeat this exercise. Repeat __________ times. Complete this exercise  __________ times a day. Alternating arm and leg raises 1. Get on your hands and knees on a firm surface. If you are on a hard floor, you may want to use padding, such as an exercise mat, to cushion your knees. 2. Line up your arms and legs. Your hands should be directly below your shoulders, and your knees should be directly below your hips. 3. Lift your left leg behind you. At the same time, raise your right arm and straighten it in front of you. ? Do not lift your leg higher than your  hip. ? Do not lift your arm higher than your shoulder. ? Keep your abdominal and back muscles tight. ? Keep your hips facing the ground. ? Do not arch your back. ? Keep your balance carefully, and do not hold your breath. 4. Hold this position for __________ seconds. 5. Slowly return to the starting position. 6. Repeat with your right leg and your left arm. Repeat __________ times. Complete this exercise __________ times a day.   Abdominal set with straight leg raise 1. Lie on your back on a firm surface. 2. Bend one of your knees and keep your other leg straight. 3. Tense your abdominal muscles and lift your straight leg up, 4-6 inches (10-15 cm) off the ground. 4. Keep your abdominal muscles tight and hold this position for __________ seconds. ? Do not hold your breath. ? Do not arch your back. Keep it flat against the ground. 5. Keep your abdominal muscles tense as you slowly lower your leg back to the starting position. 6. Repeat with your other leg. Repeat __________ times. Complete this exercise __________ times a day.   Single leg lower with bent knees 1. Lie on your back on a firm surface. 2. Tense your abdominal muscles and lift your feet off the floor, one foot at a time, so your knees and hips are bent in 90-degree angles (right angles). ? Your knees should be over your hips and your lower legs should be parallel to the floor. 3. Keeping your abdominal muscles tense and your knee bent, slowly lower one of your legs so your toe touches the ground. 4. Lift your leg back up to return to the starting position. ? Do not hold your breath. ? Do not let your back arch. Keep your back flat against the ground. 5. Repeat with your other leg. Repeat __________ times. Complete this exercise __________ times a day. Posture and body mechanics Good posture and healthy body mechanics can help to relieve stress in your body's tissues and joints. Body mechanics refers to the movements and  positions of your body while you do your daily activities. Posture is part of body mechanics. Good posture means:  Your spine is in its natural S-curve position (neutral).  Your shoulders are pulled back slightly.  Your head is not tipped forward. Follow these guidelines to improve your posture and body mechanics in your everyday activities. Standing  When standing, keep your spine neutral and your feet about hip width apart. Keep a slight bend in your knees. Your ears, shoulders, and hips should line up.  When you do a task in which you stand in one place for a long time, place one foot up on a stable object that is 2-4 inches (5-10 cm) high, such as a footstool. This helps keep your spine neutral.   Sitting  When sitting, keep your spine neutral and keep your feet flat on the floor. Use a footrest, if  necessary, and keep your thighs parallel to the floor. Avoid rounding your shoulders, and avoid tilting your head forward.  When working at a desk or a computer, keep your desk at a height where your hands are slightly lower than your elbows. Slide your chair under your desk so you are close enough to maintain good posture.  When working at a computer, place your monitor at a height where you are looking straight ahead and you do not have to tilt your head forward or downward to look at the screen.   Resting  When lying down and resting, avoid positions that are most painful for you.  If you have pain with activities such as sitting, bending, stooping, or squatting, lie in a position in which your body does not bend very much. For example, avoid curling up on your side with your arms and knees near your chest (fetal position).  If you have pain with activities such as standing for a long time or reaching with your arms, lie with your spine in a neutral position and bend your knees slightly. Try the following positions: ? Lying on your side with a pillow between your knees. ? Lying on your  back with a pillow under your knees. Lifting  When lifting objects, keep your feet at least shoulder width apart and tighten your abdominal muscles.  Bend your knees and hips and keep your spine neutral. It is important to lift using the strength of your legs, not your back. Do not lock your knees straight out.  Always ask for help to lift heavy or awkward objects.   This information is not intended to replace advice given to you by your health care provider. Make sure you discuss any questions you have with your health care provider. Document Revised: 04/13/2018 Document Reviewed: 01/11/2018 Elsevier Patient Education  2021 [employer].

## 2020-06-19 ENCOUNTER — Ambulatory Visit (INDEPENDENT_AMBULATORY_CARE_PROVIDER_SITE_OTHER): Payer: Medicare Other | Admitting: Podiatry

## 2020-06-19 ENCOUNTER — Other Ambulatory Visit: Payer: Self-pay

## 2020-06-19 DIAGNOSIS — M79675 Pain in left toe(s): Secondary | ICD-10-CM

## 2020-06-19 DIAGNOSIS — M79674 Pain in right toe(s): Secondary | ICD-10-CM | POA: Diagnosis not present

## 2020-06-19 DIAGNOSIS — B351 Tinea unguium: Secondary | ICD-10-CM | POA: Diagnosis not present

## 2020-06-22 ENCOUNTER — Ambulatory Visit (HOSPITAL_COMMUNITY)
Admission: RE | Admit: 2020-06-22 | Discharge: 2020-06-22 | Disposition: A | Discharge status: Home / Self Care | Payer: Medicare Other | Source: Ambulatory Visit | Attending: Family Medicine | Admitting: Family Medicine

## 2020-06-22 ENCOUNTER — Other Ambulatory Visit: Payer: Self-pay

## 2020-06-22 DIAGNOSIS — I517 Cardiomegaly: Secondary | ICD-10-CM | POA: Insufficient documentation

## 2020-06-22 DIAGNOSIS — R6889 Other general symptoms and signs: Secondary | ICD-10-CM

## 2020-06-22 DIAGNOSIS — R5383 Other fatigue: Secondary | ICD-10-CM | POA: Diagnosis not present

## 2020-06-22 LAB — ECHOCARDIOGRAM COMPLETE
Area-P 1/2: 3.83 cm2
S' Lateral: 3.1 cm

## 2020-06-25 ENCOUNTER — Encounter: Payer: Self-pay | Admitting: Podiatry

## 2020-06-25 NOTE — Progress Notes (Signed)
  Subjective:  Patient ID: Katherine Parker, female    DOB: 08-31-1965,  MRN: [id number]  55 y.o. female presents painful thick toenails that are difficult to trim. Pain interferes with ambulation. Aggravating factors include wearing enclosed shoe gear. Pain is relieved with periodic professional debridement.  Patient was using Vick's Vapor Rub to toenails, but would like to discuss other treatments available for onychomycosis.   Allergies  Allergen Reactions   Penicillin G     Other reaction(s): Unknown   Penicillins     Did it involve swelling of the face/tongue/throat, SOB, or low BP? Unknown Did it involve sudden or severe rash/hives, skin peeling, or any reaction on the inside of your mouth or nose? Unknown Did you need to seek medical attention at a hospital or doctor's office? Unknown When did it last happen?  Childhood    If all above answers are "NO", may proceed with cephalosporin use.      Review of Systems: Negative except as noted in the HPI.   Objective:   Constitutional Pt is a pleasant 55 y.o. African American female morbidly obese in NAD. AAO x 3.   Vascular Capillary refill time to digits immediate b/l. Palpable DP pulse(s) b/l lower extremities Palpable PT pulse(s) b/l lower extremities Pedal hair sparse. Lower extremity skin temperature gradient within normal limits.  Neurologic Protective sensation intact 5/5 intact bilaterally with 10g monofilament b/l.  Dermatologic Toenails 1-5 b/l elongated, discolored, dystrophic, thickened, crumbly with subungual debris and tenderness to dorsal palpation.  Orthopedic: Normal muscle strength 5/5 to all lower extremity muscle groups bilaterally. No pain crepitus or joint limitation noted with ROM b/l. Pes planus deformity noted b/l.    Radiographs: None Assessment:   1. Pain due to onychomycosis of toenails of both feet    Plan:  Patient was evaluated and treated and all questions answered.  Onychomycosis with pain -Nails  palliatively debridement as below. -Educated on self-care  Procedure: Nail Debridement Rationale: Pain Type of Debridement: manual, sharp debridement. Instrumentation: Nail nipper, rotary burr. Number of Nails: 10  -Examined patient. -Patient to continue soft, supportive shoe gear daily. -Discussed topical, laser and oral medication. Patient opted for topical treatment with compounded medication. Rx written for nonformulary compounding topical antifungal: [facility]: Antifungal cream - Terbinafine 3%, Fluconazole 2%, Tea Tree Oil 5%, Urea 10%, Ibuprofen 2% in DMSO Suspension #67ml. Apply to the affected nail(s) once daily. -Toenails 1-5 b/l were debrided in length and girth with sterile nail nippers and dremel without iatrogenic bleeding.  -Patient to report any pedal injuries to medical professional immediately. -Patient/POA to call should there be question/concern in the interim.  No follow-ups on file.  Freddie Breech, DPM

## 2020-07-02 ENCOUNTER — Telehealth: Payer: Self-pay

## 2020-07-02 NOTE — Telephone Encounter (Signed)
Echo is overall normal with normal heart function and valves.

## 2020-07-02 NOTE — Telephone Encounter (Signed)
Pt called to ask about her echo results.  I informed pt that I would send her message to Dr. Salena Saner for advise.  CB#[phone]

## 2020-07-08 NOTE — Telephone Encounter (Signed)
Left message on voicemail to call office.  

## 2020-07-09 NOTE — Telephone Encounter (Signed)
Notified patient of lab results.  Patient verbalized understanding.  

## 2020-08-22 ENCOUNTER — Emergency Department (HOSPITAL_COMMUNITY)
Admission: EM | Admit: 2020-08-22 | Discharge: 2020-08-23 | Disposition: A | Discharge status: Home / Self Care | Payer: Medicare Other | Attending: Emergency Medicine | Admitting: Emergency Medicine

## 2020-08-22 ENCOUNTER — Emergency Department (HOSPITAL_COMMUNITY): Payer: Medicare Other

## 2020-08-22 ENCOUNTER — Other Ambulatory Visit: Payer: Self-pay

## 2020-08-22 DIAGNOSIS — R0789 Other chest pain: Secondary | ICD-10-CM

## 2020-08-22 DIAGNOSIS — J4520 Mild intermittent asthma, uncomplicated: Secondary | ICD-10-CM | POA: Diagnosis not present

## 2020-08-22 DIAGNOSIS — I10 Essential (primary) hypertension: Secondary | ICD-10-CM

## 2020-08-22 LAB — HEPATIC FUNCTION PANEL
ALT: 7 U/L (ref 0–44)
AST: 16 U/L (ref 15–41)
Albumin: 3.7 g/dL (ref 3.5–5.0)
Alkaline Phosphatase: 81 U/L (ref 38–126)
Bilirubin, Direct: 0.1 mg/dL (ref 0.0–0.2)
Indirect Bilirubin: 0.4 mg/dL (ref 0.3–0.9)
Total Bilirubin: 0.5 mg/dL (ref 0.3–1.2)
Total Protein: 7.3 g/dL (ref 6.5–8.1)

## 2020-08-22 LAB — HCG, QUANTITATIVE, PREGNANCY: hCG, Beta Chain, Quant, S: 3 m[IU]/mL (ref <5)

## 2020-08-22 LAB — CBC
HCT: 36.8 % (ref 36.0–46.0)
Hemoglobin: 11.2 g/dL — ABNORMAL LOW (ref 12.0–15.0)
MCH: 27.1 pg (ref 26.0–34.0)
MCHC: 30.4 g/dL (ref 30.0–36.0)
MCV: 89.1 fL (ref 80.0–100.0)
Platelets: 254 10*3/uL (ref 150–400)
RBC: 4.13 MIL/uL (ref 3.87–5.11)
RDW: 13.7 % (ref 11.5–15.5)
WBC: 6.3 10*3/uL (ref 4.0–10.5)
nRBC: 0 % (ref 0.0–0.2)

## 2020-08-22 LAB — BASIC METABOLIC PANEL
Anion gap: 6 (ref 5–15)
BUN: 18 mg/dL (ref 6–20)
CO2: 28 mmol/L (ref 22–32)
Calcium: 9 mg/dL (ref 8.9–10.3)
Chloride: 106 mmol/L (ref 98–111)
Creatinine, Ser: 0.57 mg/dL (ref 0.44–1.00)
GFR, Estimated: >60 mL/min (ref >60)
Glucose, Bld: 99 mg/dL (ref 70–99)
Potassium: 4.6 mmol/L (ref 3.5–5.1)
Sodium: 140 mmol/L (ref 135–145)

## 2020-08-22 LAB — BRAIN NATRIURETIC PEPTIDE: B Natriuretic Peptide: 21.5 pg/mL (ref 0.0–100.0)

## 2020-08-22 LAB — TROPONIN I (HIGH SENSITIVITY): Troponin I (High Sensitivity): 4 ng/L (ref <18)

## 2020-08-22 MED ORDER — LISINOPRIL 20 MG PO TABS: 20.0000 mg | ORAL_TABLET | Freq: Every day | ORAL | 0 refills | Status: DC

## 2020-08-22 MED ORDER — HYDRALAZINE HCL 20 MG/ML IJ SOLN
5.0000 mg | Freq: Once | INTRAMUSCULAR | Status: AC
  Administered 2020-08-22: 5 mg via INTRAVENOUS
  Filled 2020-08-22: qty 1

## 2020-08-22 NOTE — ED Provider Notes (Signed)
[facility]-EMERGENCY DEPT Provider Note   CSN: [id number] Arrival date & time: 08/22/20  1856     History Chief Complaint  Patient presents with   Chest Pain    Katherine Parker is a 55 y.o. female.  Patient states that she has had periodic chest discomfort today she stated that it was like a sharp stabbing pain and only lasted for couple minutes.  No shortness of breath no sweating.  Nothing brings the pain on and she does not have pain with inspiration  The history is provided by the patient and medical records. No language interpreter was used.  Chest Pain Pain location:  L chest Pain quality: aching   Pain radiates to:  Does not radiate Pain severity:  Moderate Onset quality:  Sudden Duration:  2 minutes Timing:  Intermittent Progression:  Resolved Chronicity:  New Context: not breathing   Relieved by:  Nothing Associated symptoms: no abdominal pain, no back pain, no cough, no fatigue and no headache       No past medical history on file.  Patient Active Problem List   Diagnosis Date Noted   Asthma 03/09/2020   Hypercholesterolemia 03/09/2020   Insomnia 03/09/2020   Mild intermittent asthma 03/09/2020   Moderate recurrent major depression (HCC) 03/09/2020    Past Surgical History:  Procedure Laterality Date   TUBAL LIGATION       OB History   No obstetric history on file.     Family History  Problem Relation Age of Onset   COPD Mother    Congestive Heart Failure Mother    Hypertension Mother    Diabetes Mother    Colon cancer Maternal Grandmother     Social History   Tobacco Use   Smoking status: Never   Smokeless tobacco: Never  Vaping Use   Vaping Use: Never used  Substance Use Topics   Alcohol use: Not Currently    Home Medications Prior to Admission medications   Medication Sig Start Date End Date Taking? Authorizing Provider  albuterol (VENTOLIN HFA) 108 (90 Base) MCG/ACT inhaler Inhale 2 puffs into the lungs  every 6 (six) hours as needed for wheezing or shortness of breath. 05/22/17  Yes [provider]  lisinopril (ZESTRIL) 20 MG tablet Take 1 tablet (20 mg total) by mouth daily. 08/22/20  Yes Bethann Berkshire, MD  naproxen (NAPROSYN) 500 MG tablet Take 1 tablet (500 mg total) by mouth 2 (two) times daily with a meal. Patient not taking: Reported on 08/22/2020 06/05/20   Overton Mam, DO  tiZANidine (ZANAFLEX) 4 MG capsule Take 1 capsule (4 mg total) by mouth 3 (three) times daily as needed for muscle spasms. Patient not taking: Reported on 08/22/2020 06/05/20   Overton Mam, DO    Allergies    Penicillin g and Penicillins  Review of Systems   Review of Systems  Constitutional:  Negative for appetite change and fatigue.  HENT:  Negative for congestion, ear discharge and sinus pressure.   Eyes:  Negative for discharge.  Respiratory:  Negative for cough.   Cardiovascular:  Positive for chest pain.  Gastrointestinal:  Negative for abdominal pain and diarrhea.  Genitourinary:  Negative for frequency and hematuria.  Musculoskeletal:  Negative for back pain.  Skin:  Negative for rash.  Neurological:  Negative for seizures and headaches.  Psychiatric/Behavioral:  Negative for hallucinations.    Physical Exam Updated Vital Signs BP (!) 180/89   Pulse 83   Temp 98.3 F (36.8 C) (  Oral)   Resp (!) 26   Ht 5\' 6"  (1.676 m)   Wt (!) 166.9 kg   SpO2 99%   BMI 59.40 kg/m   Physical Exam Vitals and nursing note reviewed.  Constitutional:      Appearance: She is well-developed.  HENT:     Head: Normocephalic.     Nose: Nose normal.  Eyes:     General: No scleral icterus.    Conjunctiva/sclera: Conjunctivae normal.  Neck:     Thyroid: No thyromegaly.  Cardiovascular:     Rate and Rhythm: Normal rate and regular rhythm.     Heart sounds: No murmur heard.   No friction rub. No gallop.  Pulmonary:     Breath sounds: No stridor. No wheezing or rales.  Chest:     Chest wall:  No tenderness.  Abdominal:     General: There is no distension.     Tenderness: There is no abdominal tenderness. There is no rebound.  Musculoskeletal:        General: Normal range of motion.     Cervical back: Neck supple.  Lymphadenopathy:     Cervical: No cervical adenopathy.  Skin:    Findings: No erythema or rash.  Neurological:     Mental Status: She is alert and oriented to person, place, and time.     Motor: No abnormal muscle tone.     Coordination: Coordination normal.  Psychiatric:        Behavior: Behavior normal.    ED Results / Procedures / Treatments   Labs (all labs ordered are listed, but only abnormal results are displayed) Labs Reviewed  CBC - Abnormal; Notable for the following components:      Result Value   Hemoglobin 11.2 (*)    All other components within normal limits  BASIC METABOLIC PANEL  HEPATIC FUNCTION PANEL  BRAIN NATRIURETIC PEPTIDE  HCG, QUANTITATIVE, PREGNANCY  TROPONIN I (HIGH SENSITIVITY)  TROPONIN I (HIGH SENSITIVITY)    EKG EKG Interpretation  Date/Time:  Saturday August 22 2020 19:18:47 EDT Ventricular Rate:  96 PR Interval:  203 QRS Duration: 92 QT Interval:  348 QTC Calculation: 440 R Axis:   1 Text Interpretation: Sinus rhythm Borderline prolonged PR interval LAE, consider biatrial enlargement ST elevation, consider inferior injury 12 Lead; Mason-Likar Confirmed by 01-05-1994 [phone]) on 08/22/2020 8:20:14 PM  Radiology DG Chest 2 View  Result Date: 08/22/2020 CLINICAL DATA:  Chest pain. Right-sided stabbing chest pain and sharp pains in the neck starting last night. Pain for 3 days and intermittent. EXAM: CHEST - 2 VIEW COMPARISON:  02/15/2020 FINDINGS: Mild cardiac enlargement. No vascular congestion, edema, or consolidation. No pleural effusions. No pneumothorax. Mediastinal contours appear intact. No change since prior study. IMPRESSION: Cardiac enlargement.  No evidence of active pulmonary disease. Electronically  Signed   By: 04/14/2020 M.D.   On: 08/22/2020 19:48    Procedures Procedures   Medications Ordered in ED Medications  hydrALAZINE (APRESOLINE) injection 5 mg (5 mg Intravenous Given 08/22/20 2108)    ED Course  I have reviewed the triage vital signs and the nursing notes.  Pertinent labs & imaging results that were available during my care of the patient were reviewed by me and considered in my medical decision making (see chart for details).  Patient with atypical chest pain.  She saw cardiology and had an echo that showed ejection fraction 56%. MDM Rules/Calculators/A&P  Patient with atypical chest pain and hypertension.  She started on lisinopril.  And will follow up with her doctor Final Clinical Impression(s) / ED Diagnoses Final diagnoses:  Atypical chest pain  Primary hypertension    Rx / DC Orders ED Discharge Orders          Ordered    lisinopril (ZESTRIL) 20 MG tablet  Daily        08/22/20 2220             Bethann Berkshire, MD 08/24/20 1650

## 2020-08-22 NOTE — ED Triage Notes (Signed)
Right sided stabbing chest pain and sharp pains in her neck that started last night.  Pt reports the pain has been going on for 3 days and was intermittent, but now it is constant.  Pt states she was told once that she has CHF.

## 2020-08-22 NOTE — ED Provider Notes (Signed)
Blood pressure (!) 203/104, pulse 78, temperature 98.3 F (36.8 C), temperature source Oral, resp. rate 20, height 5\' 6"  (1.676 m), weight (!) 166.9 kg, SpO2 100 %.  Assuming care from Dr. .  In short, Katherine Parker is a 55 y.o. female with a chief complaint of Chest Pain .  Refer to the original H&P for additional details.  The current plan of care is to follow up on delta troponin.   EKG Interpretation  Date/Time:  Saturday August 22 2020 19:18:47 EDT Ventricular Rate:  96 PR Interval:  203 QRS Duration: 92 QT Interval:  348 QTC Calculation: 440 R Axis:   1 Text Interpretation: Sinus rhythm Borderline prolonged PR interval LAE, consider biatrial enlargement ST elevation, consider inferior injury 12 Lead; Mason-Likar Confirmed by 01-05-1994 [phone]) on 08/22/2020 8:20:14 PM       01:25 AM  Made aware by nursing staff that after multiple attempts they are unable to get a second troponin drawn.  I went back to update the patient who verbalized that she is ready to go home.  Her blood pressure has normalized.  She describes constant discomfort in her chest for several days.  Her initial troponin of 4 is reassuring in the setting.  Plan for close outpatient follow-up and continue her home medications.  Discussed ED return precautions. After shared decision making will defer second troponin.     08/24/2020, MD 08/23/20 [phone]

## 2020-08-22 NOTE — Discharge Instructions (Signed)
Start taking your blood pressure medicine tomorrow.  Follow-up with your family doctor this week.  Return if any problems.

## 2020-08-22 NOTE — ED Notes (Signed)
Unsuccessful blood draw attempt x2. 

## 2020-09-25 ENCOUNTER — Ambulatory Visit: Payer: Medicare Other | Admitting: Podiatry

## 2020-10-06 ENCOUNTER — Other Ambulatory Visit: Payer: Self-pay

## 2020-10-06 ENCOUNTER — Emergency Department (HOSPITAL_COMMUNITY)
Admission: EM | Admit: 2020-10-06 | Discharge: 2020-10-06 | Disposition: A | Discharge status: Home / Self Care | Payer: Medicare Other | Attending: Emergency Medicine | Admitting: Emergency Medicine

## 2020-10-06 ENCOUNTER — Emergency Department (HOSPITAL_COMMUNITY): Payer: Medicare Other

## 2020-10-06 DIAGNOSIS — R0789 Other chest pain: Secondary | ICD-10-CM | POA: Diagnosis not present

## 2020-10-06 DIAGNOSIS — R0602 Shortness of breath: Secondary | ICD-10-CM | POA: Insufficient documentation

## 2020-10-06 DIAGNOSIS — R002 Palpitations: Secondary | ICD-10-CM | POA: Insufficient documentation

## 2020-10-06 DIAGNOSIS — J4520 Mild intermittent asthma, uncomplicated: Secondary | ICD-10-CM | POA: Insufficient documentation

## 2020-10-06 LAB — BASIC METABOLIC PANEL
Anion gap: 9 (ref 5–15)
BUN: 13 mg/dL (ref 6–20)
CO2: 28 mmol/L (ref 22–32)
Calcium: 9.9 mg/dL (ref 8.9–10.3)
Chloride: 107 mmol/L (ref 98–111)
Creatinine, Ser: 0.73 mg/dL (ref 0.44–1.00)
GFR, Estimated: >60 mL/min (ref >60)
Glucose, Bld: 106 mg/dL — ABNORMAL HIGH (ref 70–99)
Potassium: 3.9 mmol/L (ref 3.5–5.1)
Sodium: 144 mmol/L (ref 135–145)

## 2020-10-06 LAB — CBC
HCT: 37.5 % (ref 36.0–46.0)
Hemoglobin: 11.5 g/dL — ABNORMAL LOW (ref 12.0–15.0)
MCH: 27 pg (ref 26.0–34.0)
MCHC: 30.7 g/dL (ref 30.0–36.0)
MCV: 88 fL (ref 80.0–100.0)
Platelets: 259 10*3/uL (ref 150–400)
RBC: 4.26 MIL/uL (ref 3.87–5.11)
RDW: 13.7 % (ref 11.5–15.5)
WBC: 6 10*3/uL (ref 4.0–10.5)
nRBC: 0 % (ref 0.0–0.2)

## 2020-10-06 LAB — TROPONIN I (HIGH SENSITIVITY)
Troponin I (High Sensitivity): 4 ng/L (ref <18)
Troponin I (High Sensitivity): 5 ng/L (ref <18)

## 2020-10-06 LAB — BRAIN NATRIURETIC PEPTIDE: B Natriuretic Peptide: 24.9 pg/mL (ref 0.0–100.0)

## 2020-10-06 NOTE — ED Provider Notes (Signed)
[facility]-EMERGENCY DEPT Provider Note   CSN: [id number] Arrival date & time: 10/06/20  1603     History Chief Complaint  Patient presents with   Chest Pain    Katherine Parker is a 55 y.o. female.  The history is provided by the patient.  Palpitations Palpitations quality:  Fast Onset quality:  Sudden Progression:  Resolved Chronicity:  New Context: not anxiety   Relieved by:  Nothing Worsened by:  Nothing Associated symptoms: chest pain   Associated symptoms: no back pain, no cough, no nausea, no shortness of breath and no vomiting   Risk factors: no heart disease       No past medical history on file.  Patient Active Problem List   Diagnosis Date Noted   Asthma 03/09/2020   Hypercholesterolemia 03/09/2020   Insomnia 03/09/2020   Mild intermittent asthma 03/09/2020   Moderate recurrent major depression (HCC) 03/09/2020    Past Surgical History:  Procedure Laterality Date   TUBAL LIGATION       OB History   No obstetric history on file.     Family History  Problem Relation Age of Onset   COPD Mother    Congestive Heart Failure Mother    Hypertension Mother    Diabetes Mother    Colon cancer Maternal Grandmother     Social History   Tobacco Use   Smoking status: Never   Smokeless tobacco: Never  Vaping Use   Vaping Use: Never used  Substance Use Topics   Alcohol use: Not Currently    Home Medications Prior to Admission medications   Medication Sig Start Date End Date Taking? Authorizing Provider  acetaminophen (TYLENOL) 325 MG tablet Take 650 mg by mouth every 6 (six) hours as needed for moderate pain or headache.   Yes [provider]  albuterol (VENTOLIN HFA) 108 (90 Base) MCG/ACT inhaler Inhale 2 puffs into the lungs every 6 (six) hours as needed for wheezing or shortness of breath. 05/22/17  Yes [provider]  Melatonin 10 MG TABS Take 10 mg by mouth at bedtime as needed (sleep).   Yes [provider]  lisinopril (ZESTRIL) 20 MG tablet Take 1 tablet (20 mg total) by mouth daily. Patient not taking: Reported on 10/06/2020 08/22/20   Bethann Berkshire, MD  naproxen (NAPROSYN) 500 MG tablet Take 1 tablet (500 mg total) by mouth 2 (two) times daily with a meal. Patient not taking: No sig reported 06/05/20   Cirigliano, Jearld Lesch, DO  tiZANidine (ZANAFLEX) 4 MG capsule Take 1 capsule (4 mg total) by mouth 3 (three) times daily as needed for muscle spasms. Patient not taking: No sig reported 06/05/20   Overton Mam, DO    Allergies    Penicillin g and Penicillins  Review of Systems   Review of Systems  Constitutional:  Negative for chills and fever.  HENT:  Negative for ear pain and sore throat.   Eyes:  Negative for pain and visual disturbance.  Respiratory:  Negative for cough and shortness of breath.   Cardiovascular:  Positive for chest pain and palpitations.  Gastrointestinal:  Negative for abdominal pain, nausea and vomiting.  Genitourinary:  Negative for dysuria and hematuria.  Musculoskeletal:  Negative for arthralgias and back pain.  Skin:  Negative for color change and rash.  Neurological:  Negative for seizures and syncope.  All other systems reviewed and are negative.  Physical Exam Updated Vital Signs BP (!) 163/98 (BP Location: Right Wrist)  Pulse 85   Temp 98 F (36.7 C) (Oral)   Resp 18   Ht 5\' 6"  (1.676 m)   Wt (!) 166.9 kg   SpO2 98%   BMI 59.40 kg/m   Physical Exam Vitals and nursing note reviewed.  Constitutional:      General: She is not in acute distress.    Appearance: She is well-developed. She is not ill-appearing.  HENT:     Head: Normocephalic and atraumatic.  Eyes:     Extraocular Movements: Extraocular movements intact.     Conjunctiva/sclera: Conjunctivae normal.     Pupils: Pupils are equal, round, and reactive to light.  Cardiovascular:     Rate and Rhythm: Normal rate and regular rhythm.     Pulses:          Radial pulses  are 2+ on the right side and 2+ on the left side.     Heart sounds: No murmur heard. Pulmonary:     Effort: Pulmonary effort is normal. No respiratory distress.     Breath sounds: Normal breath sounds. No decreased breath sounds.  Chest:     Chest wall: Tenderness present.  Abdominal:     Palpations: Abdomen is soft.     Tenderness: There is no abdominal tenderness.  Musculoskeletal:        General: Normal range of motion.     Cervical back: Normal range of motion and neck supple.     Right lower leg: No edema.     Left lower leg: No edema.  Skin:    General: Skin is warm and dry.     Capillary Refill: Capillary refill takes less than 2 seconds.  Neurological:     General: No focal deficit present.     Mental Status: She is alert.    ED Results / Procedures / Treatments   Labs (all labs ordered are listed, but only abnormal results are displayed) Labs Reviewed  BASIC METABOLIC PANEL - Abnormal; Notable for the following components:      Result Value   Glucose, Bld 106 (*)    All other components within normal limits  CBC - Abnormal; Notable for the following components:   Hemoglobin 11.5 (*)    All other components within normal limits  BRAIN NATRIURETIC PEPTIDE  TROPONIN I (HIGH SENSITIVITY)  TROPONIN I (HIGH SENSITIVITY)    EKG EKG Interpretation  Date/Time:  Tuesday October 06 2020 16:23:10 EDT Ventricular Rate:  93 PR Interval:  169 QRS Duration: 91 QT Interval:  350 QTC Calculation: 436 R Axis:   -1 Text Interpretation: Sinus rhythm Probable left atrial enlargement Confirmed by 03-11-1982 ([id number]) on 10/06/2020 7:15:39 PM  Radiology DG Chest 2 View  Result Date: 10/06/2020 CLINICAL DATA:  Chest pain EXAM: CHEST - 2 VIEW COMPARISON:  08/22/2020 FINDINGS: Cardiac shadow is mildly prominent but stable. Lungs are well aerated bilaterally. No focal infiltrate or effusion is seen. No bony abnormality is noted. IMPRESSION: No active cardiopulmonary disease.  Electronically Signed   By: 08/24/2020 M.D.   On: 10/06/2020 19:05    Procedures Procedures   Medications Ordered in ED Medications - No data to display  ED Course  I have reviewed the triage vital signs and the nursing notes.  Pertinent labs & imaging results that were available during my care of the patient were reviewed by me and considered in my medical decision making (see chart for details).    MDM Rules/Calculators/A&P  Katherine Parker is here with palpitations, chest pain.  Patient with unremarkable vitals.  No fever.  States like she had a brief episode of fast and slow heartbeat while driving.  Thought maybe she was in a pass out or maybe she has some chest pain but now has resolved.  Has not had chest pain or shortness of breath prior to this.  Feels much better at this time.  No major medical problems.  History and physical is unremarkable.  Does not sound like ACS or PE or heart failure symptoms.  Maybe she had some symptomatic PVCs or PACs.  She did not lose consciousness.  EKG shows sinus rhythm.  No ischemic changes.  We will get troponins, chest x-ray, basic labs.  Troponin negative x2.  Chest x-ray with no pneumonia, no pneumothorax, no volume overload.  She has no PE risk factors and doubt PE.  She is not have any shortness of breath and no hypoxia.  No active symptoms.  No significant anemia, electrolyte abnormality, kidney injury.  Overall may be some symptomatic palpitations.  Recommend follow-up with primary care doctor.  Discharged in good condition.  Understands return precautions.  This chart was dictated using voice recognition software.  Despite best efforts to proofread,  errors can occur which can change the documentation meaning.   Final Clinical Impression(s) / ED Diagnoses Final diagnoses:  Palpitations  Atypical chest pain    Rx / DC Orders ED Discharge Orders     None        Virgina Norfolk, DO 10/06/20 2042

## 2020-10-06 NOTE — ED Triage Notes (Signed)
Pt presents with new onset chest discomfort that began while driving.  She had an episode and felt like "everything was shutting down" in her chest, became diaphoretic, and then felt like her heart sped up again.  Pt states this happened 2 times in a span of 30 min.  Endorses nausea.

## 2020-10-06 NOTE — Discharge Instructions (Addendum)
Follow-up with your primary care doctor.  Please return if symptoms worsen. °

## 2020-10-06 NOTE — ED Provider Notes (Signed)
Emergency Medicine Provider Triage Evaluation Note  Katherine Parker , a 55 y.o. female  was evaluated in triage.  Pt complains of chest pain.  She presents today for chest discomfort.  She reports about five seconds of feeling like her heart was slowing down and then sped up again.  She got diaphoretic. This lasted for about 5 seconds.  She felt like she was going to pass out during this.    She is not taking any blood pressure medications.    Review of Systems  Positive: Chest pain, right arm pain.  Negative: Syncope. Leg swelling.   Physical Exam  BP (!) 154/85   Pulse 95   Temp 98.3 F (36.8 C) (Oral)   Resp 18   SpO2 97%  Gen:   Awake, no distress   Resp:  Normal effort  MSK:   Moves extremities without difficulty  Other:  RRR  Medical Decision Making  Medically screening exam initiated at 4:49 PM.  Appropriate orders placed.  Katherine Parker was informed that the remainder of the evaluation will be completed by another provider, this initial triage assessment does not replace that evaluation, and the importance of remaining in the ED until their evaluation is complete.  Note: Portions of this report may have been transcribed using voice recognition software. Every effort was made to ensure accuracy; however, inadvertent computerized transcription errors may be present    Cristina Gong, PA-C 10/06/20 1655    Virgina Norfolk, DO 10/06/20 2324

## 2020-10-12 ENCOUNTER — Other Ambulatory Visit: Payer: Self-pay

## 2020-10-13 ENCOUNTER — Ambulatory Visit (INDEPENDENT_AMBULATORY_CARE_PROVIDER_SITE_OTHER): Payer: Medicare Other | Admitting: Family Medicine

## 2020-10-13 ENCOUNTER — Encounter: Payer: Self-pay | Admitting: Family Medicine

## 2020-10-13 ENCOUNTER — Ambulatory Visit (INDEPENDENT_AMBULATORY_CARE_PROVIDER_SITE_OTHER): Payer: Medicare Other

## 2020-10-13 VITALS — BP 136/84 | HR 76 | Temp 97.0°F | Ht 66.0 in | Wt 346.8 lb

## 2020-10-13 DIAGNOSIS — E7800 Pure hypercholesterolemia, unspecified: Secondary | ICD-10-CM

## 2020-10-13 DIAGNOSIS — G8929 Other chronic pain: Secondary | ICD-10-CM

## 2020-10-13 DIAGNOSIS — Z6841 Body Mass Index (BMI) 40.0 and over, adult: Secondary | ICD-10-CM | POA: Insufficient documentation

## 2020-10-13 DIAGNOSIS — Z23 Encounter for immunization: Secondary | ICD-10-CM | POA: Diagnosis not present

## 2020-10-13 DIAGNOSIS — I499 Cardiac arrhythmia, unspecified: Secondary | ICD-10-CM | POA: Insufficient documentation

## 2020-10-13 DIAGNOSIS — M5441 Lumbago with sciatica, right side: Secondary | ICD-10-CM | POA: Diagnosis not present

## 2020-10-13 DIAGNOSIS — I10 Essential (primary) hypertension: Secondary | ICD-10-CM

## 2020-10-13 DIAGNOSIS — I422 Other hypertrophic cardiomyopathy: Secondary | ICD-10-CM | POA: Diagnosis not present

## 2020-10-13 DIAGNOSIS — Z131 Encounter for screening for diabetes mellitus: Secondary | ICD-10-CM | POA: Diagnosis not present

## 2020-10-13 DIAGNOSIS — I429 Cardiomyopathy, unspecified: Secondary | ICD-10-CM | POA: Insufficient documentation

## 2020-10-13 LAB — COMPREHENSIVE METABOLIC PANEL
ALT: 4 U/L (ref 0–35)
AST: 15 U/L (ref 0–37)
Albumin: 4.1 g/dL (ref 3.5–5.2)
Alkaline Phosphatase: 92 U/L (ref 39–117)
BUN: 12 mg/dL (ref 6–23)
CO2: 29 mEq/L (ref 19–32)
Calcium: 9.3 mg/dL (ref 8.4–10.5)
Chloride: 102 mEq/L (ref 96–112)
Creatinine, Ser: 0.59 mg/dL (ref 0.40–1.20)
GFR: 100.4 mL/min (ref >60.00)
Glucose, Bld: 82 mg/dL (ref 70–99)
Potassium: 4.4 mEq/L (ref 3.5–5.1)
Sodium: 137 mEq/L (ref 135–145)
Total Bilirubin: 0.4 mg/dL (ref 0.2–1.2)
Total Protein: 7 g/dL (ref 6.0–8.3)

## 2020-10-13 LAB — HEMOGLOBIN A1C: Hgb A1c MFr Bld: 6 % (ref 4.6–6.5)

## 2020-10-13 LAB — LIPID PANEL
Cholesterol: 225 mg/dL — ABNORMAL HIGH (ref 0–200)
HDL: 77.1 mg/dL (ref >39.00)
LDL Cholesterol: 141 mg/dL — ABNORMAL HIGH (ref 0–99)
NonHDL: 147.46
Total CHOL/HDL Ratio: 3
Triglycerides: 34 mg/dL (ref 0.0–149.0)
VLDL: 6.8 mg/dL (ref 0.0–40.0)

## 2020-10-13 LAB — CBC
HCT: 35.8 % — ABNORMAL LOW (ref 36.0–46.0)
Hemoglobin: 11.5 g/dL — ABNORMAL LOW (ref 12.0–15.0)
MCHC: 32.2 g/dL (ref 30.0–36.0)
MCV: 84.9 fl (ref 78.0–100.0)
Platelets: 261 10*3/uL (ref 150.0–400.0)
RBC: 4.21 Mil/uL (ref 3.87–5.11)
RDW: 14.2 % (ref 11.5–15.5)
WBC: 4.3 10*3/uL (ref 4.0–10.5)

## 2020-10-13 LAB — TSH: TSH: 0.69 u[IU]/mL (ref 0.35–5.50)

## 2020-10-13 MED ORDER — LISINOPRIL 5 MG PO TABS: 5.0000 mg | ORAL_TABLET | Freq: Every day | ORAL | 11 refills | Status: DC

## 2020-10-13 NOTE — Progress Notes (Signed)
[facility]ARE [facility] [address] [place] [address] Dept: [phone] Dept Fax: [phone]  Office Visit  Subjective:    Patient ID: Katherine Parker, female    DOB: [birth year], 56 y.o..   MRN: [id number]  Chief Complaint  Patient presents with   Hospitalization Follow-up    F/u form hospital on 10/06/20 for chest pain.  C/o having lower back pain x weeks with last several days being worse.  She has tylenol with little relief.   Flu shot today    History of Present Illness:  Patient is in today for follow-up from a recent ED visit. She notes she was out driving, when she had a sudden sensation of a pause in her heart and then rapid heart rate. She also noted sharp mid chest pains and some dizziness. The ED cleared her from an acute cardiac issue (normal troponin, normal BNP, EKG with trial enlargement, but otherwise normal). Ms. Pavlicek had been noted previously to have atrial enlargement on a prior CT Angiogram. She has a history of hypertension. She was treated with lisinopril, but had recently stopped this as she didn't feel it was really helping her. She also has a history of an elevated cholesterol. Ms. Lurie notes that she has been having progressive weight gain. She has a family history of diabetes.  Additionally, Ms. Koelzer mentions right low back pain that has been intermittently bothersome. 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 She notes that she tries to avoid anything that will make her drowsy, so she only uses intermittent Tylenol for the pain.   Past Medical History: Patient Active Problem List   Diagnosis Date Noted   Irregular heart beat 10/13/2020   Cardiomyopathy (HCC) 10/13/2020   Morbid obesity with BMI of 50.0-59.9, adult (HCC) 10/13/2020   Essential hypertension 10/13/2020   Chronic right-sided low back pain with right-sided sciatica 10/13/2020   Asthma 03/09/2020   Hypercholesterolemia 03/09/2020    Insomnia 03/09/2020   Mild intermittent asthma 03/09/2020   Moderate recurrent major depression (HCC) 03/09/2020   Past Surgical History:  Procedure Laterality Date   GUM SURGERY     TUBAL LIGATION     Family History  Problem Relation Age of Onset   COPD Mother    Congestive Heart Failure Mother    Hypertension Mother    Diabetes Mother    Colon cancer Maternal Grandmother    Outpatient Medications Prior to Visit  Medication Sig Dispense Refill   acetaminophen (TYLENOL) 325 MG tablet Take 650 mg by mouth every 6 (six) hours as needed for moderate pain or headache.     albuterol (VENTOLIN HFA) 108 (90 Base) MCG/ACT inhaler Inhale 2 puffs into the lungs every 6 (six) hours as needed for wheezing or shortness of breath.     Melatonin 10 MG TABS Take 10 mg by mouth at bedtime as needed (sleep).     lisinopril (ZESTRIL) 20 MG tablet Take 1 tablet (20 mg total) by mouth daily. (Patient not taking: No sig reported) 30 tablet 0   naproxen (NAPROSYN) 500 MG tablet Take 1 tablet (500 mg total) by mouth 2 (two) times daily with a meal. (Patient not taking: No sig reported) 60 tablet 2   tiZANidine (ZANAFLEX) 4 MG capsule Take 1 capsule (4 mg total) by mouth 3 (three) times daily as needed for muscle spasms. (Patient not taking: No sig reported) 60 capsule 0   No facility-administered medications prior to visit.  Allergies  Allergen Reactions   Penicillin G     Other reaction(s): Unknown   Penicillins     Did it involve swelling of the face/tongue/throat, SOB, or low BP? Unknown Did it involve sudden or severe rash/hives, skin peeling, or any reaction on the inside of your mouth or nose? Unknown Did you need to seek medical attention at a hospital or doctor's office? Unknown When did it last happen?  Childhood    If all above answers are "NO", may proceed with cephalosporin use.      Objective:   Today's Vitals   10/13/20 1140  BP: 136/84  Pulse: 76  Temp: (!) 97 F (36.1 C)   TempSrc: Temporal  SpO2: 95%  Weight: (!) 346 lb 12.8 oz (157.3 kg)  Height: 5\' 6"  (1.676 m)   Body mass index is 55.97 kg/m.   General: Well developed, well nourished. No acute distress. CV: RRR without murmurs or rubs. Pulses 2+ bilaterally. Back: Mild to moderate tenderness over the paraspinal muscles in the right lower back, Psych: Alert and oriented. Normal mood and affect.  Health Maintenance Due  Topic Date Due   COVID-19 Vaccine (1) Never done   HIV Screening  Never done   Hepatitis C Screening  Never done   TETANUS/TDAP  Never done   COLONOSCOPY (Pts 45-80yrs Insurance coverage will need to be confirmed)  Never done   MAMMOGRAM  Never done   Zoster Vaccines- Shingrix (1 of 2) Never done   PAP SMEAR-Modifier  05/22/2020   INFLUENZA VACCINE  Never done     Imaging: CT Angio of Chest (02/16/2020) IMPRESSION: 1. No pulmonary embolus to the proximal segmental level. Distal segmental and subsegmental branches are not well assessed due to contrast bolus timing and soft tissue attenuation from habitus. 2. Multi chamber cardiomegaly. Dilated main pulmonary artery suggesting pulmonary arterial hypertension. 3. Patulous esophagus containing scattered intraluminal fluid, suggesting reflux. Tiny hiatal hernia. 4. Mild central bronchial thickening.  Echocardiogram (06/22/2020) IMPRESSIONS   1. Left ventricular ejection fraction by 3D volume is 58 %. The left ventricle has normal function. The left ventricle has no regional wall motion abnormalities. There is moderate left ventricular hypertrophy. Left ventricular diastolic parameters are indeterminate.   2. Right ventricular systolic function is normal. The right ventricular size is normal.   3. Left atrial size was mildly dilated.   4. Right atrial size was mildly dilated.   5. The mitral valve is normal in structure. Trivial mitral valve regurgitation.   6. The aortic valve is normal in structure. Aortic valve regurgitation is  not visualized. No aortic stenosis is present.   Assessment & Plan:   1. Irregular heart beat Ms. Timothy recent episode may have represented a transient dysrhythmia. Her dilated atria put her at risk for atrial fibrillation. I will refer her to cardiology for an assessment and consideration of a loop recorder.  - Ambulatory referral to Cardiology  2. Hypertrophic cardiomyopathy (HCC) Ms. Pearse echo showed LVH with a mild decrease in her EF. I will check labs to screen for potential underlying causes. I will have cardiology assess. She may benefit from starting on a beta blocker.  - TSH - Comprehensive metabolic panel - CBC - Ambulatory referral to Cardiology  3. Hypercholesterolemia We will reassess her lipid results.  - Lipid panel  4. Screening for diabetes mellitus (DM) I recommend screening for diabetes in light of family history and obesity.  - Hemoglobin A1c  5. Chronic right-sided low back pain  with right-sided sciatica Likely muscular, but will check a lumbar spine series to assess.  - DG Lumbar Spine Complete; Future  6. Essential hypertension Blood pressure is in the range of Stage 1 hypertension. I will restart a lower dose of lisinopril and we will monitor.  - lisinopril (ZESTRIL) 5 MG tablet; Take 1 tablet (5 mg total) by mouth daily.  Dispense: 30 tablet; Refill: 11  Loyola Mast, MD

## 2020-10-20 ENCOUNTER — Other Ambulatory Visit: Payer: Self-pay

## 2020-10-20 ENCOUNTER — Ambulatory Visit (INDEPENDENT_AMBULATORY_CARE_PROVIDER_SITE_OTHER): Payer: Medicare Other | Admitting: Family Medicine

## 2020-10-20 VITALS — BP 130/78 | HR 82 | Temp 97.5°F | Ht 66.0 in | Wt 352.4 lb

## 2020-10-20 DIAGNOSIS — M5441 Lumbago with sciatica, right side: Secondary | ICD-10-CM

## 2020-10-20 DIAGNOSIS — Z6841 Body Mass Index (BMI) 40.0 and over, adult: Secondary | ICD-10-CM

## 2020-10-20 DIAGNOSIS — M4317 Spondylolisthesis, lumbosacral region: Secondary | ICD-10-CM | POA: Diagnosis not present

## 2020-10-20 DIAGNOSIS — R7303 Prediabetes: Secondary | ICD-10-CM | POA: Insufficient documentation

## 2020-10-20 DIAGNOSIS — D649 Anemia, unspecified: Secondary | ICD-10-CM | POA: Diagnosis not present

## 2020-10-20 DIAGNOSIS — I499 Cardiac arrhythmia, unspecified: Secondary | ICD-10-CM

## 2020-10-20 DIAGNOSIS — I422 Other hypertrophic cardiomyopathy: Secondary | ICD-10-CM

## 2020-10-20 DIAGNOSIS — E785 Hyperlipidemia, unspecified: Secondary | ICD-10-CM

## 2020-10-20 DIAGNOSIS — G8929 Other chronic pain: Secondary | ICD-10-CM

## 2020-10-20 MED ORDER — OZEMPIC (0.25 OR 0.5 MG/DOSE) 2 MG/1.5ML ~~LOC~~ SOPN: PEN_INJECTOR | SUBCUTANEOUS | 2 refills | Status: DC
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
## 2020-10-21 LAB — IRON,TIBC AND FERRITIN PANEL
%SAT: 15 % (calc) — ABNORMAL LOW (ref 16–45)
Ferritin: 59 ng/mL (ref 16–232)
Iron: 45 ug/dL (ref 45–160)
TIBC: 306 mcg/dL (calc) (ref 250–450)

## 2020-11-05 ENCOUNTER — Emergency Department (HOSPITAL_COMMUNITY): Payer: Medicare Other

## 2020-11-05 ENCOUNTER — Other Ambulatory Visit: Payer: Self-pay

## 2020-11-05 ENCOUNTER — Emergency Department (HOSPITAL_COMMUNITY)
Admission: EM | Admit: 2020-11-05 | Discharge: 2020-11-05 | Disposition: A | Discharge status: Home / Self Care | Payer: Medicare Other | Attending: Emergency Medicine | Admitting: Emergency Medicine

## 2020-11-05 ENCOUNTER — Encounter (HOSPITAL_COMMUNITY): Payer: Self-pay | Admitting: Emergency Medicine

## 2020-11-05 DIAGNOSIS — Z794 Long term (current) use of insulin: Secondary | ICD-10-CM | POA: Diagnosis not present

## 2020-11-05 DIAGNOSIS — I509 Heart failure, unspecified: Secondary | ICD-10-CM | POA: Insufficient documentation

## 2020-11-05 DIAGNOSIS — J45909 Unspecified asthma, uncomplicated: Secondary | ICD-10-CM | POA: Diagnosis not present

## 2020-11-05 DIAGNOSIS — R0602 Shortness of breath: Secondary | ICD-10-CM | POA: Diagnosis present

## 2020-11-05 DIAGNOSIS — I471 Supraventricular tachycardia: Secondary | ICD-10-CM | POA: Insufficient documentation

## 2020-11-05 DIAGNOSIS — Z79899 Other long term (current) drug therapy: Secondary | ICD-10-CM | POA: Insufficient documentation

## 2020-11-05 DIAGNOSIS — I110 Hypertensive heart disease with heart failure: Secondary | ICD-10-CM | POA: Diagnosis not present

## 2020-11-05 HISTORY — DX: Heart failure, unspecified: I50.9

## 2020-11-05 LAB — CBC WITH DIFFERENTIAL/PLATELET
Abs Immature Granulocytes: 0.01 10*3/uL (ref 0.00–0.07)
Basophils Absolute: 0 10*3/uL (ref 0.0–0.1)
Basophils Relative: 0 %
Eosinophils Absolute: 0.1 10*3/uL (ref 0.0–0.5)
Eosinophils Relative: 1 %
HCT: 37.9 % (ref 36.0–46.0)
Hemoglobin: 12.2 g/dL (ref 12.0–15.0)
Immature Granulocytes: 0 %
Lymphocytes Relative: 35 %
Lymphs Abs: 2.1 10*3/uL (ref 0.7–4.0)
MCH: 27.9 pg (ref 26.0–34.0)
MCHC: 32.2 g/dL (ref 30.0–36.0)
MCV: 86.5 fL (ref 80.0–100.0)
Monocytes Absolute: 0.4 10*3/uL (ref 0.1–1.0)
Monocytes Relative: 7 %
Neutro Abs: 3.4 10*3/uL (ref 1.7–7.7)
Neutrophils Relative %: 57 %
Platelets: 277 10*3/uL (ref 150–400)
RBC: 4.38 MIL/uL (ref 3.87–5.11)
RDW: 14 % (ref 11.5–15.5)
WBC: 6 10*3/uL (ref 4.0–10.5)
nRBC: 0 % (ref 0.0–0.2)

## 2020-11-05 LAB — COMPREHENSIVE METABOLIC PANEL
ALT: 6 U/L (ref 0–44)
AST: 26 U/L (ref 15–41)
Albumin: 4 g/dL (ref 3.5–5.0)
Alkaline Phosphatase: 90 U/L (ref 38–126)
Anion gap: 5 (ref 5–15)
BUN: 17 mg/dL (ref 6–20)
CO2: 26 mmol/L (ref 22–32)
Calcium: 9.2 mg/dL (ref 8.9–10.3)
Chloride: 106 mmol/L (ref 98–111)
Creatinine, Ser: 0.79 mg/dL (ref 0.44–1.00)
GFR, Estimated: >60 mL/min (ref >60)
Glucose, Bld: 132 mg/dL — ABNORMAL HIGH (ref 70–99)
Potassium: 4.9 mmol/L (ref 3.5–5.1)
Sodium: 137 mmol/L (ref 135–145)
Total Bilirubin: 1.2 mg/dL (ref 0.3–1.2)
Total Protein: 7.9 g/dL (ref 6.5–8.1)

## 2020-11-05 LAB — TROPONIN I (HIGH SENSITIVITY): Troponin I (High Sensitivity): 11 ng/L (ref <18)

## 2020-11-05 MED ORDER — ADENOSINE 6 MG/2ML IV SOLN
INTRAVENOUS | Status: AC
  Administered 2020-11-05: 12 mg via INTRAVENOUS
  Filled 2020-11-05: qty 6

## 2020-11-05 MED ORDER — ADENOSINE 6 MG/2ML IV SOLN
6.0000 mg | Freq: Once | INTRAVENOUS | Status: AC
  Administered 2020-11-05: 6 mg via INTRAVENOUS

## 2020-11-05 MED ORDER — ADENOSINE 6 MG/2ML IV SOLN: 12.0000 mg | Freq: Once | INTRAVENOUS | Status: AC

## 2020-11-05 NOTE — ED Triage Notes (Signed)
Pt states that she started having SOB and racing HR approx 25 mins ago. HR 200. Alert and oriented.

## 2020-11-05 NOTE — ED Provider Notes (Signed)
[facility]-EMERGENCY DEPT Provider Note   CSN: [id number] Arrival date & time: 11/05/20  1149     History Chief Complaint  Patient presents with   Tachycardia   Shortness of Breath    Katherine Parker is a 55 y.o. female.  Patient presents with shortness of breath and chest tightness.  Patient has had no fever chills cough.  The history is provided by the patient and medical records. No language interpreter was used.  Shortness of Breath Severity:  Moderate Onset quality:  Sudden Timing:  Constant Progression:  Worsening Chronicity:  New Context: activity   Relieved by:  Nothing Worsened by:  Nothing Ineffective treatments:  None tried Associated symptoms: no abdominal pain, no chest pain, no cough, no headaches and no rash       Past Medical History:  Diagnosis Date   Anxiety    CHF (congestive heart failure) (HCC)    Hypertension     Patient Active Problem List   Diagnosis Date Noted   Normocytic anemia 10/20/2020   Spondylolisthesis at L5-S1 level 10/20/2020   Prediabetes 10/20/2020   Irregular heart beat 10/13/2020   Hypertrophic cardiomyopathy (HCC) 10/13/2020   Morbid obesity with BMI of 50.0-59.9, adult (HCC) 10/13/2020   Essential hypertension 10/13/2020   Chronic right-sided low back pain with right-sided sciatica 10/13/2020   Asthma 03/09/2020   Borderline hyperlipidemia 03/09/2020   Insomnia 03/09/2020   Mild intermittent asthma 03/09/2020   Moderate recurrent major depression (HCC) 03/09/2020    Past Surgical History:  Procedure Laterality Date   GUM SURGERY     TUBAL LIGATION       OB History   No obstetric history on file.     Family History  Problem Relation Age of Onset   COPD Mother    Congestive Heart Failure Mother    Hypertension Mother    Diabetes Mother    Colon cancer Maternal Grandmother     Social History   Tobacco Use   Smoking status: Never   Smokeless tobacco: Never  Vaping Use   Vaping  Use: Never used  Substance Use Topics   Alcohol use: Not Currently   Drug use: Never    Home Medications Prior to Admission medications   Medication Sig Start Date End Date Taking? Authorizing Provider  acetaminophen (TYLENOL) 325 MG tablet Take 650 mg by mouth every 6 (six) hours as needed for moderate pain or headache.   Yes [provider]  albuterol (VENTOLIN HFA) 108 (90 Base) MCG/ACT inhaler Inhale 2 puffs into the lungs every 6 (six) hours as needed for wheezing or shortness of breath. 05/22/17  Yes [provider]  lisinopril (ZESTRIL) 5 MG tablet Take 1 tablet (5 mg total) by mouth daily. 10/13/20  Yes Loyola Mast, MD  Melatonin 10 MG TABS Take 10 mg by mouth at bedtime as needed (sleep).    [provider]  Semaglutide,0.25 or 0.5MG /DOS, (OZEMPIC, 0.25 OR 0.5 MG/DOSE,) 2 MG/1.5ML SOPN Inject 0.25 mg into the skin once a week for 28 days, THEN 0.5 mg once a week for 28 days. 10/20/20 12/15/20  Loyola Mast, MD    Allergies    Penicillin g and Penicillins  Review of Systems   Review of Systems  Constitutional:  Negative for appetite change and fatigue.  HENT:  Negative for congestion, ear discharge and sinus pressure.   Eyes:  Negative for discharge.  Respiratory:  Positive for shortness of breath. Negative for cough.   Cardiovascular:  Positive for palpitations. Negative for chest pain.  Gastrointestinal:  Negative for abdominal pain and diarrhea.  Genitourinary:  Negative for frequency and hematuria.  Musculoskeletal:  Negative for back pain.  Skin:  Negative for rash.  Neurological:  Negative for seizures and headaches.  Psychiatric/Behavioral:  Negative for hallucinations.    Physical Exam Updated Vital Signs BP (!) 152/87   Pulse (!) 106   Temp 98.4 F (36.9 C) (Oral)   Resp 20   Ht 5\' 6"  (1.676 m)   Wt (!) 160 kg   SpO2 100%   BMI 56.93 kg/m   Physical Exam Vitals and nursing note reviewed.  Constitutional:       Appearance: She is well-developed.  HENT:     Head: Normocephalic.     Nose: Nose normal.     Mouth/Throat:     Mouth: Mucous membranes are moist.  Eyes:     General: No scleral icterus.    Conjunctiva/sclera: Conjunctivae normal.  Neck:     Thyroid: No thyromegaly.  Cardiovascular:     Rate and Rhythm: Regular rhythm. Tachycardia present.     Heart sounds: No murmur heard.   No friction rub. No gallop.  Pulmonary:     Breath sounds: No stridor. No wheezing or rales.  Chest:     Chest wall: No tenderness.  Abdominal:     General: There is no distension.     Tenderness: There is no abdominal tenderness. There is no rebound.  Musculoskeletal:        General: Normal range of motion.     Cervical back: Neck supple.  Lymphadenopathy:     Cervical: No cervical adenopathy.  Skin:    Findings: No erythema or rash.  Neurological:     Mental Status: She is alert and oriented to person, place, and time.     Motor: No abnormal muscle tone.     Coordination: Coordination normal.  Psychiatric:        Behavior: Behavior normal.    ED Results / Procedures / Treatments   Labs (all labs ordered are listed, but only abnormal results are displayed) Labs Reviewed  COMPREHENSIVE METABOLIC PANEL - Abnormal; Notable for the following components:      Result Value   Glucose, Bld 132 (*)    All other components within normal limits  CBC WITH DIFFERENTIAL/PLATELET  TROPONIN I (HIGH SENSITIVITY)  TROPONIN I (HIGH SENSITIVITY)    EKG None  Radiology DG Chest Port 1 View  Result Date: 11/05/2020 CLINICAL DATA:  Shortness of breath EXAM: PORTABLE CHEST 1 VIEW COMPARISON:  10/06/2020 FINDINGS: Cardiomegaly. Both lungs are clear. The visualized skeletal structures are unremarkable. IMPRESSION: Cardiomegaly without acute abnormality of the lungs in AP portable projection. Electronically Signed   By: Delanna Ahmadi M.D.   On: 11/05/2020 12:28    Procedures Procedures   Medications Ordered in  ED Medications  adenosine (ADENOCARD) 6 MG/2ML injection 12 mg (12 mg Intravenous Given 11/05/20 1210)  adenosine (ADENOCARD) 6 MG/2ML injection 6 mg (6 mg Intravenous Given 11/05/20 1207)    ED Course  I have reviewed the triage vital signs and the nursing notes.  Pertinent labs & imaging results that were available during my care of the patient were reviewed by me and considered in my medical decision making (see chart for details). Patient was in SVT at a rate of 200.  She initially was given Identicard 6 mg but did not convert.  Then she was given 12 mg and did  convert.  Patient felt back to normal when she converted.  CRITICAL CARE Performed by: Milton Ferguson Total critical care time: 40 minutes Critical care time was exclusive of separately billable procedures and treating other patients. Critical care was necessary to treat or prevent imminent or life-threatening deterioration. Critical care was time spent personally by me on the following activities: development of treatment plan with patient and/or surrogate as well as nursing, discussions with consultants, evaluation of patient's response to treatment, examination of patient, obtaining history from patient or surrogate, ordering and performing treatments and interventions, ordering and review of laboratory studies, ordering and review of radiographic studies, pulse oximetry and re-evaluation of patient's condition.  MDM Rules/Calculators/A&P                           Patient with an SVT that converted with adenosine.  She will be discharged home and will follow up with cardiology Final Clinical Impression(s) / ED Diagnoses Final diagnoses:  SVT (supraventricular tachycardia) ([place])    Rx / DC Orders ED Discharge Orders     None        Milton Ferguson, MD 11/05/20 1453

## 2020-11-05 NOTE — Discharge Instructions (Addendum)
Follow-up with Dr. Jens Som or one of his colleagues in the next couple weeks.  Return if any problem

## 2020-11-11 NOTE — Progress Notes (Signed)
Referring-Katherine Parker, Katherine Parker Reason for referral-SVT and chest pain  HPI: 55 year old female for evaluation of SVT and chest pain at request of Katherine Parker, Katherine Parker. CTA February 2022 showed no pulmonary embolus, dilated pulmonary artery suggestive of pulmonary hypertension.  Echocardiogram June 2022 showed normal LV function, moderate left ventricular hypertrophy, mild biatrial enlargement.  Has been seen multiple times in the emergency room with chest pain.  Troponins normal.  Patient seen in the emergency room with SVT November 2022.  Patient given adenosine and converted to sinus rhythm.  Patient states that for several years she has had intermittent palpitations.  They are sudden in onset and described as her heart racing.  They resolve spontaneously.  They are not associated with any activities.  They initially were short-lived.  They have increased in frequency recently to the point she is having these weekly.  Her most severe episode was when she went to the emergency room with SVT.  She also has occasional chest pain in various locations on her chest that are not related to activities.  She feels as though sometimes these increase with stress or food intake.  She does not have exertional chest pain.  She has dyspnea with more vigorous activities but not routine activities.  Occasional mild pedal edema.  Cardiology now asked to evaluate.  Current Outpatient Medications  Medication Sig Dispense Refill   acetaminophen (TYLENOL) 325 MG tablet Take 650 mg by mouth every 6 (six) hours as needed for moderate pain or headache.     albuterol (VENTOLIN HFA) 108 (90 Base) MCG/ACT inhaler Inhale 2 puffs into the lungs every 6 (six) hours as needed for wheezing or shortness of breath.     cyclobenzaprine (FLEXERIL) 5 MG tablet Take 1 tablet (5 mg total) by mouth 3 (three) times daily as needed for muscle spasms. 30 tablet 1   lisinopril (ZESTRIL) 5 MG tablet Take 1 tablet (5 mg total) by mouth daily. 30 tablet  11   Melatonin 10 MG TABS Take 10 mg by mouth at bedtime as needed (sleep).     No current facility-administered medications for this visit.    Allergies  Allergen Reactions   Penicillin G     Other reaction(s): Unknown   Penicillins     Did it involve swelling of the face/tongue/throat, SOB, or low BP? Unknown Did it involve sudden or severe rash/hives, skin peeling, or any reaction on the inside of your mouth or nose? Unknown Did you need to seek medical attention at a hospital or doctor's office? Unknown When did it last happen?  Childhood    If all above answers are "NO", may proceed with cephalosporin use.       Past Medical History:  Diagnosis Date   Anxiety    CHF (congestive heart failure) (HCC)    Hypertension    SVT (supraventricular tachycardia) (HCC)     Past Surgical History:  Procedure Laterality Date   GUM SURGERY     TUBAL LIGATION      Social History   Socioeconomic History   Marital status: Single    Spouse name: Not on file   Number of children: 2   Years of education: Not on file   Highest education level: Not on file  Occupational History   Not on file  Tobacco Use   Smoking status: Never   Smokeless tobacco: Never  Vaping Use   Vaping Use: Never used  Substance and Sexual Activity   Alcohol use: Never  Drug use: Never   Sexual activity: Not Currently  Other Topics Concern   Not on file  Social History Narrative   Not on file   Social Determinants of Health   Financial Resource Strain: Not on file  Food Insecurity: Not on file  Transportation Needs: Not on file  Physical Activity: Not on file  Stress: Not on file  Social Connections: Not on file  Intimate Partner Violence: Not on file    Family History  Problem Relation Age of Onset   COPD Mother    Congestive Heart Failure Mother    Hypertension Mother    Diabetes Mother    Colon cancer Maternal Grandmother     ROS: no fevers or chills, productive cough, hemoptysis,  dysphasia, odynophagia, melena, hematochezia, dysuria, hematuria, rash, seizure activity, orthopnea, PND, pedal edema, claudication. Remaining systems are negative.  Physical Exam:   Blood pressure 126/86, pulse 78, height 5' 6.5" (1.689 m), weight (!) 346 lb 9.6 oz (157.2 kg), SpO2 99 %.  General:  Well developed/obese in NAD Skin warm/dry Patient not depressed No peripheral clubbing Back-normal HEENT-normal/normal eyelids Neck supple/normal carotid upstroke bilaterally; no bruits; no JVD; no thyromegaly chest - CTA/ normal expansion CV - RRR/normal S1 and S2; no murmurs, rubs or gallops;  PMI nondisplaced Abdomen -NT/ND, no HSM, no mass, + bowel sounds, no bruit 2+ femoral pulses, no bruits Ext-no edema, chords, 2+ DP Neuro-grossly nonfocal  ECG -November 05, 2020-SVT at a rate of 195; personally reviewed  A/P  1 supraventricular tachycardia-patient has increasing frequency of SVT to the point she is having episodes weekly.  I will add Toprol 25 mg daily.  Note recent echocardiogram showed normal LV function.  I will ask electrophysiology to review for consideration of ablation.  2 chest pain-symptoms atypical.  She does not have exertional chest pain.  We will not pursue further ischemia evaluation.  3 hypertension-blood pressure controlled.  Continue present medications.  4 Snoring-she states that she snores significantly.  We will arrange sleep study to exclude sleep apnea.  5 dilated pulmonary artery-suggestive of pulmonary hypertension.  Likely from obesity hypoventilation syndrome and sleep apnea.  We discussed weight loss today and we will treat sleep apnea if needed.  6 morbid obesity-we discussed the importance of diet, exercise and weight loss.  Katherine Millers, Katherine Parker

## 2020-11-18 ENCOUNTER — Other Ambulatory Visit: Payer: Self-pay

## 2020-11-18 ENCOUNTER — Ambulatory Visit (INDEPENDENT_AMBULATORY_CARE_PROVIDER_SITE_OTHER): Payer: Medicare Other | Admitting: Family Medicine

## 2020-11-18 VITALS — BP 126/80 | HR 87 | Temp 97.2°F | Ht 66.0 in | Wt 347.8 lb

## 2020-11-18 DIAGNOSIS — Z6841 Body Mass Index (BMI) 40.0 and over, adult: Secondary | ICD-10-CM

## 2020-11-18 DIAGNOSIS — H9202 Otalgia, left ear: Secondary | ICD-10-CM | POA: Diagnosis not present

## 2020-11-18 DIAGNOSIS — L03019 Cellulitis of unspecified finger: Secondary | ICD-10-CM | POA: Diagnosis not present

## 2020-11-18 DIAGNOSIS — G8929 Other chronic pain: Secondary | ICD-10-CM

## 2020-11-18 DIAGNOSIS — M5441 Lumbago with sciatica, right side: Secondary | ICD-10-CM | POA: Diagnosis not present

## 2020-11-18 DIAGNOSIS — I471 Supraventricular tachycardia: Secondary | ICD-10-CM

## 2020-11-18 MED ORDER — CYCLOBENZAPRINE HCL 5 MG PO TABS
5.0000 mg | ORAL_TABLET | Freq: Three times a day (TID) | ORAL | 1 refills | Status: DC | PRN
Start: 2020-11-18 — End: 2021-04-27

## 2020-11-18 NOTE — Progress Notes (Signed)
[facility] [address] [place] [address] Dept: [phone] Dept Fax: [phone]  Office Visit  Subjective:    Patient ID: Katherine Parker, female    DOB: 21-May-1965, 55 y.o..   MRN: [id number]  Chief Complaint  Patient presents with   Acute Visit    C/o having LT ear pain x 2 days.  Also having RT thumb numbness.     History of Present Illness:  Patient is in today complaining of a 2-day history of left ear pain. She notes int he past day she has had some mild rhinorrhea. She has had no ear drainage. She took Tylenol to help with the discomfort.  Katherine Parker also notes that her right thumb has felt numb at the tip. She notes that over the last week, she did have pain and redness int he tip of the thumb. She thought she might of gotten something lodged under the nail, but has not found anything. She was not had any drainage from the site.  Katherine Parker was seen at [facility] on 11/3 with an episode of SVT. She received adenosine, which stopped the tachycardia. She is scheduled to see the cardiologist tomorrow.  Katherine Parker continues to complain of chronic right lower back pain with sciatica. She notes that this was mopre recently flared, but that she did not have any medication to take for this.  Katherine Parker was recently prescribed semaglutide for obesity. However, she notes this was not covered by her insurance and she was unable to afford this. She wonders about other options for her weight management.  Past Medical History: Patient Active Problem List   Diagnosis Date Noted   Paroxysmal SVT (supraventricular tachycardia) (HCC) 11/18/2020   Normocytic anemia 10/20/2020   Spondylolisthesis at L5-S1 level 10/20/2020   Prediabetes 10/20/2020   Irregular heart beat 10/13/2020   Hypertrophic cardiomyopathy (HCC) 10/13/2020   Morbid obesity with BMI of 50.0-59.9, adult (HCC) 10/13/2020   Essential hypertension 10/13/2020   Chronic  right-sided low back pain with right-sided sciatica 10/13/2020   Asthma 03/09/2020   Borderline hyperlipidemia 03/09/2020   Insomnia 03/09/2020   Mild intermittent asthma 03/09/2020   Moderate recurrent major depression (HCC) 03/09/2020   Past Surgical History:  Procedure Laterality Date   GUM SURGERY     TUBAL LIGATION     Family History  Problem Relation Age of Onset   COPD Mother    Congestive Heart Failure Mother    Hypertension Mother    Diabetes Mother    Colon cancer Maternal Grandmother    Outpatient Medications Prior to Visit  Medication Sig Dispense Refill   acetaminophen (TYLENOL) 325 MG tablet Take 650 mg by mouth every 6 (six) hours as needed for moderate pain or headache.     albuterol (VENTOLIN HFA) 108 (90 Base) MCG/ACT inhaler Inhale 2 puffs into the lungs every 6 (six) hours as needed for wheezing or shortness of breath.     lisinopril (ZESTRIL) 5 MG tablet Take 1 tablet (5 mg total) by mouth daily. 30 tablet 11   Melatonin 10 MG TABS Take 10 mg by mouth at bedtime as needed (sleep).     Semaglutide,0.25 or 0.5MG /DOS, (OZEMPIC, 0.25 OR 0.5 MG/DOSE,) 2 MG/1.5ML SOPN Inject 0.25 mg into the skin once a week for 28 days, THEN 0.5 mg once a week for 28 days. (Patient not taking: Reported on 11/18/2020) 1.5 mL 2   No facility-administered medications prior to visit.   Allergies  Allergen  Reactions   Penicillin G     Other reaction(s): Unknown   Penicillins     Did it involve swelling of the face/tongue/throat, SOB, or low BP? Unknown Did it involve sudden or severe rash/hives, skin peeling, or any reaction on the inside of your mouth or nose? Unknown Did you need to seek medical attention at a hospital or doctor's office? Unknown When did it last happen?  Childhood    If all above answers are "NO", may proceed with cephalosporin use.      Objective:   Today's Vitals   11/18/20 0815  BP: 126/80  Pulse: 87  Temp: (!) 97.2 F (36.2 C)  TempSrc: Temporal   SpO2: 98%  Weight: (!) 347 lb 12.8 oz (157.8 kg)  Height: 5\' 6"  (1.676 m)   Body mass index is 56.14 kg/m.   General: Well developed, well nourished. No acute distress. HEENT: Normocephalic, non-traumatic. The right external ear, EAC and TM is normal, without sign of swelling , redness,   wax buildup or bulging TM.  Extremities: The tip of the right thumb is hardened and may have a small fluid collection under the skin. There is no   significant swelling, redness, or pain on palpation. Psych: Alert and oriented. Normal mood and affect.  Health Maintenance Due  Topic Date Due   COVID-19 Vaccine (1) Never done   Pneumococcal Vaccine 53-73 Years old (1 - PCV) Never done   HIV Screening  Never done   Hepatitis C Screening  Never done   TETANUS/TDAP  Never done   COLONOSCOPY (Pts 45-70yrs Insurance coverage will need to be confirmed)  Never done   MAMMOGRAM  Never done   Zoster Vaccines- Shingrix (1 of 2) Never done   PAP SMEAR-Modifier  05/22/2020     Assessment & Plan:   1. Otalgia of left ear The ear appears normal. There may have been some Eustachian tube dysfunction causing her pain. I recommend we monitor this for now. She should continue use of Tylenol as needed.  2. Felon of finger The right thumb appears to possibly be a resolving felon. I recommend she do hot soaks for 15 min. three times a day until resolved.  3. Paroxysmal SVT (supraventricular tachycardia) [facility]) Pending cardiology assessment tomorrow.  4. Chronic right-sided low back pain with right-sided sciatica I will prescribe some Flexeril to see if this improves her sleep and reduces some of her discomfort.  - cyclobenzaprine (FLEXERIL) 5 MG tablet; Take 1 tablet (5 mg total) by mouth 3 (three) times daily as needed for muscle spasms.  Dispense: 30 tablet; Refill: 1  5. Morbid obesity with BMI of 50.0-59.9, adult [facility]) Recommend referral for medical weight management.  - Amb Ref to Medical Weight  Management  [facility], MD

## 2020-11-19 ENCOUNTER — Ambulatory Visit: Payer: Medicare Other | Admitting: Cardiology

## 2020-11-19 ENCOUNTER — Encounter: Payer: Self-pay | Admitting: Cardiology

## 2020-11-19 VITALS — BP 126/86 | HR 78 | Ht 66.5 in | Wt 346.6 lb

## 2020-11-19 DIAGNOSIS — I10 Essential (primary) hypertension: Secondary | ICD-10-CM | POA: Diagnosis not present

## 2020-11-19 DIAGNOSIS — R0683 Snoring: Secondary | ICD-10-CM | POA: Diagnosis not present

## 2020-11-19 DIAGNOSIS — I471 Supraventricular tachycardia: Secondary | ICD-10-CM

## 2020-11-19 MED ORDER — METOPROLOL SUCCINATE ER 25 MG PO TB24: 25.0000 mg | ORAL_TABLET | Freq: Every day | ORAL | 3 refills | Status: DC

## 2020-11-19 NOTE — Patient Instructions (Signed)
Medication Instructions:   START METOPROLOL SUCC ER 25 MG ONCE DAILY AT BEDTIME  *If you need a refill on your cardiac medications before your next appointment, please call your pharmacy*   Testing/Procedures:  Your physician has recommended that you have a sleep study. This test records several body functions during sleep, including: brain activity, eye movement, oxygen and carbon dioxide blood levels, heart rate and rhythm, breathing rate and rhythm, the flow of air through your mouth and nose, snoring, body muscle movements, and chest and belly movement.   Follow-Up: At [facility], you and your health needs are our priority.  As part of our continuing mission to provide you with exceptional heart care, we have created designated Provider Care Teams.  These Care Teams include your primary Cardiologist (physician) and Advanced Practice Providers (APPs -  Physician Assistants and Nurse Practitioners) who all work together to provide you with the care you need, when you need it.  We recommend signing up for the patient portal called "MyChart".  Sign up information is provided on this After Visit Summary.  MyChart is used to connect with patients for Virtual Visits (Telemedicine).  Patients are able to view lab/test results, encounter notes, upcoming appointments, etc.  Non-urgent messages can be sent to your provider as well.   To learn more about what you can do with MyChart, go to [email or web address].    Your next appointment:   6 month(s)  The format for your next appointment:   In Person  Provider:   Olga Millers MD

## 2020-11-25 ENCOUNTER — Emergency Department (HOSPITAL_COMMUNITY)
Admission: EM | Admit: 2020-11-25 | Discharge: 2020-11-25 | Disposition: A | Discharge status: Home / Self Care | Payer: Medicare Other | Attending: Emergency Medicine | Admitting: Emergency Medicine

## 2020-11-25 ENCOUNTER — Emergency Department (HOSPITAL_COMMUNITY): Payer: Medicare Other

## 2020-11-25 ENCOUNTER — Encounter (HOSPITAL_COMMUNITY): Payer: Self-pay

## 2020-11-25 ENCOUNTER — Telehealth: Payer: Self-pay | Admitting: Family Medicine

## 2020-11-25 DIAGNOSIS — Z79899 Other long term (current) drug therapy: Secondary | ICD-10-CM | POA: Insufficient documentation

## 2020-11-25 DIAGNOSIS — R479 Unspecified speech disturbances: Secondary | ICD-10-CM

## 2020-11-25 DIAGNOSIS — R4782 Fluency disorder in conditions classified elsewhere: Secondary | ICD-10-CM | POA: Insufficient documentation

## 2020-11-25 DIAGNOSIS — Z7952 Long term (current) use of systemic steroids: Secondary | ICD-10-CM | POA: Diagnosis not present

## 2020-11-25 DIAGNOSIS — J4520 Mild intermittent asthma, uncomplicated: Secondary | ICD-10-CM | POA: Diagnosis not present

## 2020-11-25 DIAGNOSIS — F8081 Childhood onset fluency disorder: Secondary | ICD-10-CM | POA: Insufficient documentation

## 2020-11-25 DIAGNOSIS — I10 Essential (primary) hypertension: Secondary | ICD-10-CM | POA: Diagnosis not present

## 2020-11-25 LAB — APTT: aPTT: 28 seconds (ref 24–36)

## 2020-11-25 LAB — CBC WITH DIFFERENTIAL/PLATELET
Abs Immature Granulocytes: 0.01 10*3/uL (ref 0.00–0.07)
Basophils Absolute: 0 10*3/uL (ref 0.0–0.1)
Basophils Relative: 0 %
Eosinophils Absolute: 0.1 10*3/uL (ref 0.0–0.5)
Eosinophils Relative: 1 %
HCT: 37.3 % (ref 36.0–46.0)
Hemoglobin: 12 g/dL (ref 12.0–15.0)
Immature Granulocytes: 0 %
Lymphocytes Relative: 33 %
Lymphs Abs: 1.9 10*3/uL (ref 0.7–4.0)
MCH: 27.9 pg (ref 26.0–34.0)
MCHC: 32.2 g/dL (ref 30.0–36.0)
MCV: 86.7 fL (ref 80.0–100.0)
Monocytes Absolute: 0.4 10*3/uL (ref 0.1–1.0)
Monocytes Relative: 7 %
Neutro Abs: 3.4 10*3/uL (ref 1.7–7.7)
Neutrophils Relative %: 59 %
Platelets: 271 10*3/uL (ref 150–400)
RBC: 4.3 MIL/uL (ref 3.87–5.11)
RDW: 13.8 % (ref 11.5–15.5)
WBC: 5.8 10*3/uL (ref 4.0–10.5)
nRBC: 0 % (ref 0.0–0.2)

## 2020-11-25 LAB — PROTIME-INR
INR: 1.1 (ref 0.8–1.2)
Prothrombin Time: 14.3 seconds (ref 11.4–15.2)

## 2020-11-25 LAB — COMPREHENSIVE METABOLIC PANEL
ALT: 8 U/L (ref 0–44)
AST: 18 U/L (ref 15–41)
Albumin: 4 g/dL (ref 3.5–5.0)
Alkaline Phosphatase: 94 U/L (ref 38–126)
Anion gap: 6 (ref 5–15)
BUN: 15 mg/dL (ref 6–20)
CO2: 26 mmol/L (ref 22–32)
Calcium: 9.1 mg/dL (ref 8.9–10.3)
Chloride: 104 mmol/L (ref 98–111)
Creatinine, Ser: 0.59 mg/dL (ref 0.44–1.00)
GFR, Estimated: >60 mL/min (ref >60)
Glucose, Bld: 99 mg/dL (ref 70–99)
Potassium: 3.8 mmol/L (ref 3.5–5.1)
Sodium: 136 mmol/L (ref 135–145)
Total Bilirubin: 0.7 mg/dL (ref 0.3–1.2)
Total Protein: 8 g/dL (ref 6.5–8.1)

## 2020-11-25 LAB — ETHANOL: Alcohol, Ethyl (B): <10 mg/dL (ref <10)

## 2020-11-25 NOTE — ED Provider Notes (Signed)
Emergency Medicine Provider Triage Evaluation Note  Katherine Parker , a 55 y.o. female  was evaluated in triage.  Pt complains of sudden onset of stuttering.  Patient reports yesterday at 4:00 she suddenly started stuttering she has never had stuttering her speech issues previously and has been persistent since then.  She says just before that she did have a mild left-sided headache that has been intermittent but this is not the most severe headache of her life, was not sudden in onset.  She reports she had a little bit of soreness in her right arm but no numbness, weakness or tingling in any of her extremities, no visual changes, or dizziness.  No changes in her balance.  No prior history of migraine or stroke.  No other aggravating or alleviating factors.  Review of Systems  Positive: Speech changes, headache Negative: Nausea, vomiting, numbness, weakness, tingling, visual changes  Physical Exam  BP (!) 160/108   Pulse 80   Temp 99 F (37.2 C) (Oral)   Resp 18   SpO2 100%  Gen:   Awake, no distress   Resp:  Normal effort  MSK:   Moves extremities without difficulty  Other:  Stuttering speech that is persistent, cranial nerves otherwise intact, 5/5 strength in bilateral upper and lower extremities, normal sensation, normal coordination  Medical Decision Making  Medically screening exam initiated at 6:32 PM.  Appropriate orders placed.  Shaunae Sieloff was informed that the remainder of the evaluation will be completed by another provider, this initial triage assessment does not replace that evaluation, and the importance of remaining in the ED until their evaluation is complete.     Dartha Lodge, PA-C 11/25/20 1851    Pricilla Loveless, MD 11/25/20 [phone]

## 2020-11-25 NOTE — ED Provider Notes (Signed)
[facility]-EMERGENCY DEPT Provider Note   CSN: [id number] Arrival date & time: 11/25/20  1744     History Chief Complaint  Patient presents with   Stuttering    Katherine Parker is a 55 y.o. female.  55 year old female presents with stuttering that began approximately 18 hours ago.  Notes that she did start a new medication metoprolol several hours before the symptoms started.  She denies any weakness in arms or legs.  No ataxia.  Did have a headache several days ago but none currently.  No confusion.  States he feels as if she gets stuck on certain words.  Denies any prior history of same.  No treatment use prior to arrival      Past Medical History:  Diagnosis Date   Anxiety    CHF (congestive heart failure) (HCC)    Hypertension    SVT (supraventricular tachycardia) (HCC)     Patient Active Problem List   Diagnosis Date Noted   Paroxysmal SVT (supraventricular tachycardia) (HCC) 11/18/2020   Normocytic anemia 10/20/2020   Spondylolisthesis at L5-S1 level 10/20/2020   Prediabetes 10/20/2020   Irregular heart beat 10/13/2020   Hypertrophic cardiomyopathy (HCC) 10/13/2020   Morbid obesity with BMI of 50.0-59.9, adult (HCC) 10/13/2020   Essential hypertension 10/13/2020   Chronic right-sided low back pain with right-sided sciatica 10/13/2020   Asthma 03/09/2020   Borderline hyperlipidemia 03/09/2020   Insomnia 03/09/2020   Mild intermittent asthma 03/09/2020   Moderate recurrent major depression (HCC) 03/09/2020    Past Surgical History:  Procedure Laterality Date   GUM SURGERY     TUBAL LIGATION       OB History   No obstetric history on file.     Family History  Problem Relation Age of Onset   COPD Mother    Congestive Heart Failure Mother    Hypertension Mother    Diabetes Mother    Colon cancer Maternal Grandmother     Social History   Tobacco Use   Smoking status: Never   Smokeless tobacco: Never  Vaping Use   Vaping Use:  Never used  Substance Use Topics   Alcohol use: Never   Drug use: Never    Home Medications Prior to Admission medications   Medication Sig Start Date End Date Taking? Authorizing Provider  acetaminophen (TYLENOL) 325 MG tablet Take 650 mg by mouth every 6 (six) hours as needed for moderate pain or headache.    [provider]  albuterol (VENTOLIN HFA) 108 (90 Base) MCG/ACT inhaler Inhale 2 puffs into the lungs every 6 (six) hours as needed for wheezing or shortness of breath. 05/22/17   [provider]  cyclobenzaprine (FLEXERIL) 5 MG tablet Take 1 tablet (5 mg total) by mouth 3 (three) times daily as needed for muscle spasms. 11/18/20   Loyola Mast, MD  lisinopril (ZESTRIL) 5 MG tablet Take 1 tablet (5 mg total) by mouth daily. 10/13/20   Loyola Mast, MD  Melatonin 10 MG TABS Take 10 mg by mouth at bedtime as needed (sleep).    [provider]  metoprolol succinate (TOPROL XL) 25 MG 24 hr tablet Take 1 tablet (25 mg total) by mouth daily. 11/19/20   Lewayne Bunting, MD    Allergies    Penicillin g and Penicillins  Review of Systems   Review of Systems  All other systems reviewed and are negative.  Physical Exam Updated Vital Signs BP (!) 160/108   Pulse 80  Temp 99 F (37.2 C) (Oral)   Resp 18   SpO2 100%   Physical Exam Vitals and nursing note reviewed.  Constitutional:      General: She is not in acute distress.    Appearance: Normal appearance. She is well-developed. She is not toxic-appearing.  HENT:     Head: Normocephalic and atraumatic.  Eyes:     General: Lids are normal.     Conjunctiva/sclera: Conjunctivae normal.     Pupils: Pupils are equal, round, and reactive to light.  Neck:     Thyroid: No thyroid mass.     Trachea: No tracheal deviation.  Cardiovascular:     Rate and Rhythm: Normal rate and regular rhythm.     Heart sounds: Normal heart sounds. No murmur heard.   No gallop.  Pulmonary:     Effort: Pulmonary  effort is normal. No respiratory distress.     Breath sounds: Normal breath sounds. No stridor. No decreased breath sounds, wheezing, rhonchi or rales.  Abdominal:     General: There is no distension.     Palpations: Abdomen is soft.     Tenderness: There is no abdominal tenderness. There is no rebound.  Musculoskeletal:        General: No tenderness. Normal range of motion.     Cervical back: Normal range of motion and neck supple.  Skin:    General: Skin is warm and dry.     Findings: No abrasion or rash.  Neurological:     Mental Status: She is alert and oriented to person, place, and time. Mental status is at baseline.     GCS: GCS eye subscore is 4. GCS verbal subscore is 5. GCS motor subscore is 6.     Cranial Nerves: No cranial nerve deficit.     Sensory: No sensory deficit.     Motor: Motor function is intact.     Comments: Does have stuttering speech does not appear to be expressive aphasia.  Psychiatric:        Attention and Perception: Attention normal.        Speech: Speech normal.        Behavior: Behavior normal.    ED Results / Procedures / Treatments   Labs (all labs ordered are listed, but only abnormal results are displayed) Labs Reviewed  ETHANOL  PROTIME-INR  APTT  COMPREHENSIVE METABOLIC PANEL  URINALYSIS, ROUTINE W REFLEX MICROSCOPIC  CBC WITH DIFFERENTIAL/PLATELET    EKG EKG Interpretation  Date/Time:  Wednesday November 25 2020 21:06:25 EST Ventricular Rate:  79 PR Interval:  187 QRS Duration: 96 QT Interval:  386 QTC Calculation: 443 R Axis:   30 Text Interpretation: Sinus rhythm LAE, consider biatrial enlargement Borderline ST elevation, anterior leads Confirmed by Lorre Nick ([id number]) on 11/25/2020 9:33:00 PM  Radiology MR BRAIN WO CONTRAST  Result Date: 11/25/2020 CLINICAL DATA:  Stroke suspected, slurred speech EXAM: MRI HEAD WITHOUT CONTRAST TECHNIQUE: Multiplanar, multiecho pulse sequences of the brain and surrounding structures  were obtained without intravenous contrast. COMPARISON:  None. FINDINGS: Brain: No restricted diffusion to suggest acute or subacute infarct. No acute hemorrhage, cerebral edema, mass, mass effect, or midline shift. No hemosiderin deposition to suggest remote hemorrhage. Vascular: Normal flow voids. Skull and upper cervical spine: Normal marrow signal. Sinuses/Orbits: Negative. Other: The mastoids are well aerated. IMPRESSION: No acute intracranial process. Electronically Signed   By: Wiliam Ke M.D.   On: 11/25/2020 20:39    Procedures Procedures   Medications Ordered in ED  Medications - No data to display  ED Course  I have reviewed the triage vital signs and the nursing notes.  Pertinent labs & imaging results that were available during my care of the patient were reviewed by me and considered in my medical decision making (see chart for details).    MDM Rules/Calculators/A&P                           Patient MRI of her brain which was negative.  Neurological exam is nonfocal at this time.  Labs are reassuring.  Had a long discussion with patient about recent stress in her life and she notes that she has had an increased amount due to a death in the family as well as other family stressors.  She does have history of depression as well as anxiety.  When she was talking with me about this.  Her speech became more clear.  Low suspicion that she is having an acute neurological event.  I also discussed this case with neurologist on-call who felt that an light of a negative brain MRI this is likely to represent a CVA.  Will give referral to neurology on-call Final Clinical Impression(s) / ED Diagnoses Final diagnoses:  None    Rx / DC Orders ED Discharge Orders     None        Lorre Nick, MD 11/25/20 2142

## 2020-11-25 NOTE — Discharge Instructions (Signed)
Return here if you develop weakness on one side of your body, unable to speak, or any other problems.  Follow-up with the neurologist on-call

## 2020-11-25 NOTE — ED Triage Notes (Signed)
Pt arrived via POV, c/o sudden onset of stuttering since 4pm yesterday. Denies any other sx. Endorses pain of right arm.

## 2020-11-25 NOTE — ED Notes (Signed)
An After Visit Summary was printed and given to the patient. Discharge instructions given and no further questions at this time.  

## 2020-11-25 NOTE — ED Notes (Signed)
Patient transported to MRI 

## 2020-11-25 NOTE — Telephone Encounter (Signed)
Called patient on phone, she is stuttering (having hard time getting words out), Rt hand has slight shaking and some SOB that started  11/24/20 around 3-4 pm. She did start Metoprolol yesterday.    After talking to Dr Rudd;he advised that she should go to the ED to be evaluated. Patient agrees with plan. Dm/cma

## 2020-12-10 ENCOUNTER — Institutional Professional Consult (permissible substitution): Payer: Medicare Other | Admitting: Internal Medicine

## 2021-01-06 ENCOUNTER — Encounter (HOSPITAL_BASED_OUTPATIENT_CLINIC_OR_DEPARTMENT_OTHER): Payer: Medicare Other | Admitting: Cardiovascular Disease

## 2021-01-18 ENCOUNTER — Other Ambulatory Visit: Payer: Self-pay

## 2021-01-19 ENCOUNTER — Encounter: Payer: Self-pay | Admitting: Family Medicine

## 2021-01-19 ENCOUNTER — Ambulatory Visit (INDEPENDENT_AMBULATORY_CARE_PROVIDER_SITE_OTHER): Payer: Medicare Other | Admitting: Family Medicine

## 2021-01-19 VITALS — BP 124/72 | HR 80 | Temp 97.1°F | Ht 66.5 in | Wt 343.6 lb

## 2021-01-19 DIAGNOSIS — I10 Essential (primary) hypertension: Secondary | ICD-10-CM | POA: Diagnosis not present

## 2021-01-19 DIAGNOSIS — I471 Supraventricular tachycardia: Secondary | ICD-10-CM | POA: Diagnosis not present

## 2021-01-19 DIAGNOSIS — Z23 Encounter for immunization: Secondary | ICD-10-CM

## 2021-01-19 DIAGNOSIS — M5441 Lumbago with sciatica, right side: Secondary | ICD-10-CM | POA: Diagnosis not present

## 2021-01-19 DIAGNOSIS — I422 Other hypertrophic cardiomyopathy: Secondary | ICD-10-CM

## 2021-01-19 DIAGNOSIS — G8929 Other chronic pain: Secondary | ICD-10-CM

## 2021-01-19 DIAGNOSIS — Z1211 Encounter for screening for malignant neoplasm of colon: Secondary | ICD-10-CM

## 2021-01-19 DIAGNOSIS — Z1231 Encounter for screening mammogram for malignant neoplasm of breast: Secondary | ICD-10-CM

## 2021-01-19 DIAGNOSIS — Z6841 Body Mass Index (BMI) 40.0 and over, adult: Secondary | ICD-10-CM

## 2021-01-19 DIAGNOSIS — E785 Hyperlipidemia, unspecified: Secondary | ICD-10-CM

## 2021-01-19 NOTE — Progress Notes (Signed)
[facility] [address] [place] [address] Dept: [phone] Dept Fax: [phone]  Transfer of Care Office Visit  Subjective:    Patient ID: Katherine Parker, female    DOB: [birth year], 56 y.o..   MRN: [id number]  Chief Complaint  Patient presents with   Establish Care    [facility]- establish care.  C/o having weight and sleeping issues.  Wants handicap placard.     History of Present Illness:  Patient is in today to establish care. Katherine Parker was born in [place], [place]. She grew up in [place], [place]. She worked for about 6 years for [employer], which Katherine Parker. However, she then developed issues with depression and eventually stopped work. She moved to [place] 22 years ago, to be closer to family. She helps to provide care to her mother and her Katherine Parker, who both have chronic medical issues. Katherine Parker. She has 2 children Katherine Parker- 38, Katherine Parker) and 6 grandchildren. Katherine Parker denies any alcohol, tobacco, or drug use.  Katherine Parker has a history of hypertension and a hypertrophic cardiomyopathy without sign of heart failure. She is followed by Katherine Parker. She has also had some episodes of PSVT. She uses metoprolol for rate control. She had been on lisinopril, but had stopped taking this recently.  Katherine Parker has a history of mild, intermittent asthma. She notes she uses her albuterol maybe 1-3 times a month.  Katherine Parker has a history of chronic low back pain with sciatica. She uses Flexeril intermittently when needed for this.  Katherine Parker has had depression issues in the past, but notes these are currently in remission.   Past Medical History: Patient Active Problem List   Diagnosis Date Noted   Paroxysmal SVT (supraventricular tachycardia) ([place]) 11/18/2020   Normocytic anemia 10/20/2020   Spondylolisthesis at L5-S1 level 10/20/2020   Prediabetes 10/20/2020   Hypertrophic cardiomyopathy ([place]) 10/13/2020    Morbid obesity with BMI of 50.0-59.9, adult ([place]) 10/13/2020   Essential hypertension 10/13/2020   Chronic right-sided low back pain with right-sided sciatica 10/13/2020   Borderline hyperlipidemia 03/09/2020   Insomnia 03/09/2020   Mild intermittent asthma 03/09/2020   Moderate recurrent major depression ([place]) 03/09/2020   Past Surgical History:  Procedure Laterality Date   CESAREAN SECTION     GUM SURGERY     TUBAL LIGATION     Family History  Problem Relation Age of Onset   Heart disease Mother    COPD Mother    Congestive Heart Failure Mother    Hypertension Mother    Diabetes Mother    Dementia Father    Diabetes Maternal Grandmother    Colon cancer Maternal Grandmother    Dementia Paternal Grandmother    Dementia Paternal Grandfather    Cancer Katherine Parker        Breast, brain metastasis   Cancer Katherine Parker        Uterine   Outpatient Medications Prior to Visit  Medication Sig Dispense Refill   acetaminophen (TYLENOL) 325 MG tablet Take 650 mg by mouth every 6 (six) hours as needed for moderate pain or headache.     albuterol (VENTOLIN HFA) 108 (90 Base) MCG/ACT inhaler Inhale 2 puffs into the lungs every 6 (six) hours as needed for wheezing or shortness of breath.     cyclobenzaprine (FLEXERIL) 5 MG tablet Take 1 tablet (5 mg total) by mouth 3 (three) times daily as needed for muscle spasms. 30 tablet 1   Melatonin 10  MG TABS Take 10 mg by mouth at bedtime as needed (sleep).     metoprolol succinate (TOPROL XL) 25 MG 24 hr tablet Take 1 tablet (25 mg total) by mouth daily. 90 tablet 3   lisinopril (ZESTRIL) 5 MG tablet Take 1 tablet (5 mg total) by mouth daily. 30 tablet 11   No facility-administered medications prior to visit.   Allergies  Allergen Reactions   Penicillin G     Other reaction(s): Unknown   Penicillins     Did it involve swelling of the face/tongue/throat, SOB, or low BP? Unknown Did it involve sudden or severe rash/hives, skin peeling, or any  reaction on the inside of your mouth or nose? Unknown Did you need to seek medical attention at a hospital or doctor's office? Unknown When did it last happen?  Childhood    If all above answers are "NO", may proceed with cephalosporin use.      Objective:   Today's Vitals   01/19/21 0857  BP: 124/72  Pulse: 80  Temp: (!) 97.1 F (36.2 C)  TempSrc: Temporal  SpO2: 97%  Weight: (!) 343 lb 9.6 oz (155.9 kg)  Height: 5' 6.5" (1.689 m)   Body mass index is 54.63 kg/m.   General: Well developed, well nourished. No acute distress. Psych: Alert and oriented. Normal mood and affect.  Health Maintenance Due  Topic Date Due   Pneumococcal Vaccine 72-33 Years old (1 - PCV) Never done   HIV Screening  Never done   Hepatitis C Screening  Never done   TETANUS/TDAP  Never done   COLONOSCOPY (Pts 45-79yrs Insurance coverage will need to be confirmed)  Never done   MAMMOGRAM  Never done   Zoster Vaccines- Shingrix (1 of 2) Never done   COVID-19 Vaccine (3 - Booster for Pfizer series) 05/28/2019   PAP SMEAR-Modifier  05/22/2020   Imaging: Echocardiogram (06/22/2020) IMPRESSIONS   1. Left ventricular ejection fraction by 3D volume is 58 %. The left ventricle has normal function. The left ventricle has no regional wall motion abnormalities. There is moderate left ventricular hypertrophy. Left ventricular diastolic parameters are indeterminate.   2. Right ventricular systolic function is normal. The right ventricular size is normal.   3. Left atrial size was mildly dilated.   4. Right atrial size was mildly dilated.   5. The mitral valve is normal in structure. Trivial mitral valve regurgitation.   6. The aortic valve is normal in structure. Aortic valve regurgitation is not visualized. No aortic stenosis is present.   Lab Results Last lipids Lab Results  Component Value Date   CHOL 225 (H) 10/13/2020   HDL 77.10 10/13/2020   LDLCALC 141 (H) 10/13/2020   TRIG 34.0 10/13/2020    CHOLHDL 3 10/13/2020   Last hemoglobin A1c Lab Results  Component Value Date   HGBA1C 6.0 10/13/2020     Assessment & Plan:   1. Essential hypertension Blood pressure is at goal. She has been off of her low-dose lisinopril. At this point, I feel she could leave this off. We will plan to reassess her BP in 3-6 months.  2. Hypertrophic cardiomyopathy ([place]) No sign of CHF. Continue current dose.   3. Paroxysmal SVT (supraventricular tachycardia) ([place]) I do recommend Ms. Maines restart her metoprolol for rate control.  4. Chronic right-sided low back pain with right-sided sciatica Stable with intermittent use of Flexeril.  5. Borderline hyperlipidemia American Heart Association cardiovascular risk score shows a 4.6 % (low) risk of a  MI or stroke in the next 10 years. I recommend eating a heart-healthy diet (DASH diet or Mediterranean), getting 150 minutes of moderate-intensity exercise each week, maintaining a normal weight, and avoiding tobacco products. I also recommended she consider taking a daily fish oil supplement.  6. Morbid obesity with BMI of 50.0-59.9, adult ([place]) Discussed diet and exercise approaches to weight loss.  7. Screening for colon cancer  - Cologuard  8. Encounter for screening mammogram for malignant neoplasm of breast  - MM DIGITAL SCREENING BILATERAL; Future   Haydee Salter, MD

## 2021-02-03 ENCOUNTER — Ambulatory Visit: Payer: Medicare Other

## 2021-02-04 ENCOUNTER — Other Ambulatory Visit: Payer: Self-pay

## 2021-02-04 ENCOUNTER — Ambulatory Visit (INDEPENDENT_AMBULATORY_CARE_PROVIDER_SITE_OTHER): Payer: Medicare Other | Admitting: Podiatrist

## 2021-02-04 ENCOUNTER — Encounter: Payer: Self-pay | Admitting: Podiatrist

## 2021-02-04 DIAGNOSIS — B351 Tinea unguium: Secondary | ICD-10-CM

## 2021-02-04 DIAGNOSIS — M2142 Flat foot [pes planus] (acquired), left foot: Secondary | ICD-10-CM

## 2021-02-04 DIAGNOSIS — M2141 Flat foot [pes planus] (acquired), right foot: Secondary | ICD-10-CM

## 2021-02-04 DIAGNOSIS — M79675 Pain in left toe(s): Secondary | ICD-10-CM | POA: Diagnosis not present

## 2021-02-04 DIAGNOSIS — M79674 Pain in right toe(s): Secondary | ICD-10-CM

## 2021-02-04 MED ORDER — TERBINAFINE HCL 250 MG PO TABS: 250.0000 mg | ORAL_TABLET | Freq: Every day | ORAL | 0 refills | Status: DC

## 2021-02-04 NOTE — Progress Notes (Signed)
Chief Complaint  Patient presents with   Debridement    Trim toenails and calluses      HPI: Patient is 56 y.o. female who presents today for painful and thickened toenails of bilateral feet with the right being worse than the left.  She states she is tired of looking at her toenails and having them be  thickened painful and she wants to try and treat them.  She also has calluses on the plantar aspect of the left foot x2 which are painful with pressure.  She denies any kidney or liver issues denies any calf pain, cramping or tenderness.   Allergies  Allergen Reactions   Penicillin G     Other reaction(s): Unknown   Penicillins     Did it involve swelling of the face/tongue/throat, SOB, or low BP? Unknown Did it involve sudden or severe rash/hives, skin peeling, or any reaction on the inside of your mouth or nose? Unknown Did you need to seek medical attention at a hospital or doctor's office? Unknown When did it last happen?  Childhood    If all above answers are "NO", may proceed with cephalosporin use.      Review of systems is negative except as noted in the HPI.  Denies nausea/ vomiting/ fevers/ chills or night sweats.   Denies difficulty breathing, denies calf pain or tenderness  Physical Exam  Patient is awake, alert, and oriented x 3.  In no acute distress.    Vascular status is intact with palpable pedal pulses DP and PT bilateral and capillary refill time less than 3 seconds bilateral.  Pedal hair sparse. No edema or erythema noted.   Neurological exam reveals epicritic and protective sensation grossly intact bilateral.  5/5 via SWMF bilateral  Dermatological exam reveals skin is supple and dry to bilateral feet.  No open lesions present.  Hyperkeratotic lesions central medial and lateral left foot.  Digital nails are thick, discolored, dystrophic, brittle with subungual debris present and clinically mycotic x 10.   Nails are painful and interfere with ambulation.     Musculoskeletal exam: Musculature intact with dorsiflexion, plantarflexion, inversion, eversion. Ankle and First MPJ joint range of motion normal.     Assessment:   ICD-10-CM   1. Pain due to onychomycosis of toenails of both feet  B35.1    M79.675    M79.674     2. Pes planus of both feet  M21.41    M21.42        Plan: Discussed treatment options and alternatives.  Discussed debridement of the toenails which was accomplished today via manual and mechanical means.    Also discussed Lamisil therapy as the treatment that can be considered for her.  She does have very thick and dystrophic toenails and has had them for some time but she does want to try and treat them with the Lamisil.  Discussed risks benefits and alternatives to the medication.  I checked her latest comprehensive metabolic panel and liver function is noted to be normal.  I did write a prescription for Lamisil 250 1 tab p.o. daily.    She will return in 3 months for continued routine foot care and to check and see how she is doing with the Lamisil.  She has any side effects from the Lamisil she is stopped taking the medication. Also printed out information about exfoliating her feet in her after visit summary

## 2021-02-04 NOTE — Patient Instructions (Signed)
Foot Exfoliation Instructions:  1)  Get a tub of warm water (about 10 cups of water or enough water to cover the bottom of your foot)  and place 1/2 cup Epsom salts in the water ( water will be a little cloudy) Soak for 15-20  minutes.  2)  remove your foot from the soak and scrub the feet with Dr. Ballard Russell epsom salt exfoliant scrub ([facility])   3)  Next use a pumice stone or a pedicure file to scrub down the rough areas of skin on your feet-  do this while your feet are still wet.  You may also prefer to go into the shower and do this while showering.  (Every other day at first , then once weekly for maintenance)  4)  cleanse all of the scrub off of your feet and dry the feet well.    5)  Apply Flexitol Heel balm or O'keefs Healthy feet foot balm after this procedure-- may also apply to feet daily or twice daily.  ** Once a week you may use Dr. Zoe Lan Ultra exfoliating foot mask or similar ([facility], [facility], etc) prior to soaking as well.      Shopping list:  Epsom salts   Pumice stone or pedicure file   Flexitol Heel balm or O'keefs healthy feet foot balm    Optional-  Dr. Matthias Hughs Ultra exfoliating foot mask         Fungal Nail Infection A fungal nail infection is a common infection of the toenails or fingernails. This condition affects toenails more often than fingernails. It often affects the great, or big, toes. More than one nail may be infected. The condition can be passed from person to person (is contagious). What are the causes? This condition is caused by a fungus, such as yeast or molds. Several types of fungi can cause the infection. These fungi are common in moist and warm areas. If your hands or feet come into contact with the fungus, it may get into a crack in your fingernail or toenail or in the surrounding skin, and cause an infection. What increases the risk? The following factors may make you more likely to develop this condition: Being of older age. Having  certain medical conditions, such as: Athlete's foot. Diabetes. Poor circulation. A weak body defense system (immune system). Walking barefoot in areas where the fungus thrives, such as showers or locker rooms. Wearing shoes and socks that cause your feet to sweat. Having a nail injury or a recent nail surgery. What are the signs or symptoms? Symptoms of this condition include: A pale spot on the nail. Thickening of the nail. A nail that becomes yellow, brown, or white. A brittle or ragged nail edge. A nail that has lifted away from the nail bed. How is this diagnosed? This condition is diagnosed with a physical exam. Your health care provider may take a scraping or clipping from your nail to test for the fungus. How is this treated? Treatment is not needed for mild infections. If you have significant nail changes, treatment may include: Antifungal medicines taken by mouth (orally). You may need to take the medicine for several weeks or several months, and you may not see the results for a long time. These medicines can cause side effects. Ask your health care provider what problems to watch for. Antifungal nail polish or nail cream. These may be used along with oral antifungal medicines. Laser treatment of the nail. Surgery to remove the nail. This may be  needed for the most severe infections. It can take a long time, usually up to a year, for the infection to go away. The infection may also come back. Follow these instructions at home: Medicines Take or apply over-the-counter and prescription medicines only as told by your health care provider. Ask your health care provider about using over-the-counter mentholated ointment on your nails. Nail care Trim your nails often. Wash and dry your hands and feet every day. Keep your feet dry. To do this: Wear absorbent socks, and change your socks frequently. Wear shoes that allow air to circulate, such as sandals or canvas tennis shoes.  Throw out old shoes. If you go to a nail salon, make sure you choose one that uses clean instruments. Use antifungal foot powder on your feet and in your shoes. General instructions Do not share personal items, such as towels or nail clippers. Do not walk barefoot in shower rooms or locker rooms. Wear rubber gloves if you are working with your hands in wet areas. Keep all follow-up visits. This is important. Contact a health care provider if: You have redness, pain, or pus near the toenail or fingernail. Your infection is not getting better, or it is getting worse after several months. You have more circulation problems near the toenail or fingernail. You have brown or black discoloration of the nail that spreads to the surrounding skin. Summary A fungal nail infection is a common infection of the toenails or fingernails. Treatment is not needed for mild infections. If you have significant nail changes, treatment may include taking medicine orally and applying medicine to your nails. It can take a long time, usually up to a year, for the infection to go away. The infection may also come back. Take or apply over-the-counter and prescription medicines only as told by your health care provider. This information is not intended to replace advice given to you by your health care provider. Make sure you discuss any questions you have with your health care provider. Document Revised: 03/23/2020 Document Reviewed: 03/23/2020 Elsevier Patient Education  [place].

## 2021-02-08 ENCOUNTER — Ambulatory Visit: Payer: Medicare Other | Admitting: Neurology

## 2021-02-18 ENCOUNTER — Ambulatory Visit: Payer: Medicare Other

## 2021-02-22 LAB — COLOGUARD: COLOGUARD: NEGATIVE

## 2021-02-23 ENCOUNTER — Ambulatory Visit (INDEPENDENT_AMBULATORY_CARE_PROVIDER_SITE_OTHER): Payer: Medicare Other

## 2021-02-23 DIAGNOSIS — Z0000 Encounter for general adult medical examination without abnormal findings: Secondary | ICD-10-CM | POA: Diagnosis not present

## 2021-02-23 DIAGNOSIS — Z0100 Encounter for examination of eyes and vision without abnormal findings: Secondary | ICD-10-CM

## 2021-02-23 DIAGNOSIS — Z1211 Encounter for screening for malignant neoplasm of colon: Secondary | ICD-10-CM

## 2021-02-23 NOTE — Patient Instructions (Signed)
[name] , Thank you for taking time to come for your Medicare Wellness Visit. I appreciate your ongoing commitment to your health goals. Please review the following plan we discussed and let me know if I can assist you in the future.   Screening recommendations/referrals: Colonoscopy: referral 02/23/2021 Mammogram: scheduled 03/11/2021 Bone Density: not of age  Recommended yearly ophthalmology/optometry visit for glaucoma screening and checkup Recommended yearly dental visit for hygiene and checkup  Vaccinations: Influenza vaccine: completed  Pneumococcal vaccine: completed  Tdap vaccine: 01/19/2021 Shingles vaccine: will consider     Advanced directives: no   Conditions/risks identified: no   Next appointment: no    Preventive Care 58 Years and Older, Female Preventive care refers to lifestyle choices and visits with your health care provider that can promote health and wellness. What does preventive care include? A yearly physical exam. This is also called an annual well check. Dental exams once or twice a year. Routine eye exams. Ask your health care provider how often you should have your eyes checked. Personal lifestyle choices, including: Daily care of your teeth and gums. Regular physical activity. Eating a healthy diet. Avoiding tobacco and drug use. Limiting alcohol use. Practicing safe sex. Taking low-dose aspirin every day. Taking vitamin and mineral supplements as recommended by your health care provider. What happens during an annual well check? The services and screenings done by your health care provider during your annual well check will depend on your age, overall health, lifestyle risk factors, and family history of disease. Counseling  Your health care provider may ask you questions about your: Alcohol use. Tobacco use. Drug use. Emotional well-being. Home and relationship well-being. Sexual activity. Eating habits. History of falls. Memory and  ability to understand (cognition). Work and work Astronomer. Reproductive health. Screening  You may have the following tests or measurements: Height, weight, and BMI. Blood pressure. Lipid and cholesterol levels. These may be checked every 5 years, or more frequently if you are over 58 years old. Skin check. Lung cancer screening. You may have this screening every year starting at age 58 if you have a 30-pack-year history of smoking and currently smoke or have quit within the past 15 years. Fecal occult blood test (FOBT) of the stool. You may have this test every year starting at age 58. Flexible sigmoidoscopy or colonoscopy. You may have a sigmoidoscopy every 5 years or a colonoscopy every 10 years starting at age 58. Hepatitis C blood test. Hepatitis B blood test. Sexually transmitted disease (STD) testing. Diabetes screening. This is done by checking your blood sugar (glucose) after you have not eaten for a while (fasting). You may have this done every 1-3 years. Bone density scan. This is done to screen for osteoporosis. You may have this done starting at age 58. Mammogram. This may be done every 1-2 years. Talk to your health care provider about how often you should have regular mammograms. Talk with your health care provider about your test results, treatment options, and if necessary, the need for more tests. Vaccines  Your health care provider may recommend certain vaccines, such as: Influenza vaccine. This is recommended every year. Tetanus, diphtheria, and acellular pertussis (Tdap, Td) vaccine. You may need a Td booster every 10 years. Zoster vaccine. You may need this after age 58. Pneumococcal 13-valent conjugate (PCV13) vaccine. One dose is recommended after age 58. Pneumococcal polysaccharide (PPSV23) vaccine. One dose is recommended after age 58. Talk to your health care provider about which screenings and vaccines  you need and how often you need them. This information is  not intended to replace advice given to you by your health care provider. Make sure you discuss any questions you have with your health care provider. Document Released: 01/16/2015 Document Revised: 09/09/2015 Document Reviewed: 10/21/2014 Elsevier Interactive Patient Education  2017 [place] Prevention in the Home Falls can cause injuries. They can happen to people of all ages. There are many things you can do to make your home safe and to help prevent falls. What can I do on the outside of my home? Regularly fix the edges of walkways and driveways and fix any cracks. Remove anything that might make you trip as you walk through a door, such as a raised step or threshold. Trim any bushes or trees on the path to your home. Use bright outdoor lighting. Clear any walking paths of anything that might make someone trip, such as rocks or tools. Regularly check to see if handrails are loose or broken. Make sure that both sides of any steps have handrails. Any raised decks and porches should have guardrails on the edges. Have any leaves, snow, or ice cleared regularly. Use sand or salt on walking paths during winter. Clean up any spills in your garage right away. This includes oil or grease spills. What can I do in the bathroom? Use night lights. Install grab bars by the toilet and in the tub and shower. Do not use towel bars as grab bars. Use non-skid mats or decals in the tub or shower. If you need to sit down in the shower, use a plastic, non-slip stool. Keep the floor dry. Clean up any water that spills on the floor as soon as it happens. Remove soap buildup in the tub or shower regularly. Attach bath mats securely with double-sided non-slip rug tape. Do not have throw rugs and other things on the floor that can make you trip. What can I do in the bedroom? Use night lights. Make sure that you have a light by your bed that is easy to reach. Do not use any sheets or blankets that  are too big for your bed. They should not hang down onto the floor. Have a firm chair that has side arms. You can use this for support while you get dressed. Do not have throw rugs and other things on the floor that can make you trip. What can I do in the kitchen? Clean up any spills right away. Avoid walking on wet floors. Keep items that you use a lot in easy-to-reach places. If you need to reach something above you, use a strong step stool that has a grab bar. Keep electrical cords out of the way. Do not use floor polish or wax that makes floors slippery. If you must use wax, use non-skid floor wax. Do not have throw rugs and other things on the floor that can make you trip. What can I do with my stairs? Do not leave any items on the stairs. Make sure that there are handrails on both sides of the stairs and use them. Fix handrails that are broken or loose. Make sure that handrails are as long as the stairways. Check any carpeting to make sure that it is firmly attached to the stairs. Fix any carpet that is loose or worn. Avoid having throw rugs at the top or bottom of the stairs. If you do have throw rugs, attach them to the floor with carpet tape. Make sure  that you have a light switch at the top of the stairs and the bottom of the stairs. If you do not have them, ask someone to add them for you. What else can I do to help prevent falls? Wear shoes that: Do not have high heels. Have rubber bottoms. Are comfortable and fit you well. Are closed at the toe. Do not wear sandals. If you use a stepladder: Make sure that it is fully opened. Do not climb a closed stepladder. Make sure that both sides of the stepladder are locked into place. Ask someone to hold it for you, if possible. Clearly mark and make sure that you can see: Any grab bars or handrails. First and last steps. Where the edge of each step is. Use tools that help you move around (mobility aids) if they are needed. These  include: Canes. Walkers. Scooters. Crutches. Turn on the lights when you go into a dark area. Replace any light bulbs as soon as they burn out. Set up your furniture so you have a clear path. Avoid moving your furniture around. If any of your floors are uneven, fix them. If there are any pets around you, be aware of where they are. Review your medicines with your doctor. Some medicines can make you feel dizzy. This can increase your chance of falling. Ask your doctor what other things that you can do to help prevent falls. This information is not intended to replace advice given to you by your health care provider. Make sure you discuss any questions you have with your health care provider. Document Released: 10/16/2008 Document Revised: 05/28/2015 Document Reviewed: 01/24/2014 Elsevier Interactive Patient Education  2017 [employer].

## 2021-02-23 NOTE — Progress Notes (Signed)
Subjective:   Katherine Parker is a 56 y.o. female who presents for an Initial Medicare Annual Wellness Visit.  Review of Systems     Cardiac Risk Factors include: advanced age (>73men, >24 women);hypertension     Objective:    Today's Vitals   There is no height or weight on file to calculate BMI.  Advanced Directives 02/23/2021 11/05/2020 10/06/2020 08/22/2020 02/15/2020  Does Patient Have a Medical Advance Directive? No No No No No  Would patient like information on creating a medical advance directive? No - Patient declined - No - Patient declined - No - Patient declined    Current Medications (verified) Outpatient Encounter Medications as of 02/23/2021  Medication Sig   acetaminophen (TYLENOL) 325 MG tablet Take 650 mg by mouth every 6 (six) hours as needed for moderate pain or headache.   albuterol (VENTOLIN HFA) 108 (90 Base) MCG/ACT inhaler Inhale 2 puffs into the lungs every 6 (six) hours as needed for wheezing or shortness of breath.   cyclobenzaprine (FLEXERIL) 5 MG tablet Take 1 tablet (5 mg total) by mouth 3 (three) times daily as needed for muscle spasms.   Melatonin 10 MG TABS Take 10 mg by mouth at bedtime as needed (sleep).   terbinafine (LAMISIL) 250 MG tablet Take 1 tablet (250 mg total) by mouth daily.   metoprolol succinate (TOPROL XL) 25 MG 24 hr tablet Take 1 tablet (25 mg total) by mouth daily. (Patient not taking: Reported on 02/23/2021)   No facility-administered encounter medications on file as of 02/23/2021.    Allergies (verified) Penicillin g and Penicillins   History: Past Medical History:  Diagnosis Date   Anxiety    CHF (congestive heart failure) (HCC)    Hypertension    SVT (supraventricular tachycardia) (HCC)    Past Surgical History:  Procedure Laterality Date   CESAREAN SECTION     GUM SURGERY     TUBAL LIGATION     Family History  Problem Relation Age of Onset   Heart disease Mother    COPD Mother    Congestive Heart Failure Mother     Hypertension Mother    Diabetes Mother    Dementia Father    Diabetes Maternal Grandmother    Colon cancer Maternal Grandmother    Dementia Paternal Grandmother    Dementia Paternal Grandfather    Cancer Half-Sister        Breast, brain metastasis   Cancer Half-Sister        Uterine   Social History   Socioeconomic History   Marital status: Single    Spouse name: Not on file   Number of children: 2   Years of education: Not on file   Highest education level: High school graduate  Occupational History   Occupation: Unemployed  Tobacco Use   Smoking status: Never   Smokeless tobacco: Never  Vaping Use   Vaping Use: Never used  Substance and Sexual Activity   Alcohol use: Never   Drug use: Never   Sexual activity: Not Currently  Other Topics Concern   Not on file  Social History Narrative   Not on file   Social Determinants of Health   Financial Resource Strain: Low Risk    Difficulty of Paying Living Expenses: Not hard at all  Food Insecurity: No Food Insecurity   Worried About Programme researcher, broadcasting/film/video in the Last Year: Never true   Ran Out of Food in the Last Year: Never true  Transportation Needs: No  Transportation Needs   Lack of Transportation (Medical): No   Lack of Transportation (Non-Medical): No  Physical Activity: Sufficiently Active   Days of Exercise per Week: 4 days   Minutes of Exercise per Session: 60 min  Stress: No Stress Concern Present   Feeling of Stress : Not at all  Social Connections: Moderately Isolated   Frequency of Communication with Friends and Family: Twice a week   Frequency of Social Gatherings with Friends and Family: Twice a week   Attends Religious Services: More than 4 times per year   Active Member of [employer] or Organizations: No   Attends Engineer, structural: Never   Marital Status: Divorced    Tobacco Counseling Counseling given: Not Answered   Clinical Intake:  Pre-visit preparation completed: Yes  Pain :  No/denies pain     Nutritional Risks: None Diabetes: No  How often do you need to have someone help you when you read instructions, pamphlets, or other written materials from your doctor or pharmacy?: 1 - Never What is the last grade level you completed in school?: High School  Diabetic?no  Interpreter Needed?: No  Information entered by :: L.Reene Harlacher,LPN   Activities of Daily Living In your present state of health, do you have any difficulty performing the following activities: 02/23/2021  Hearing? N  Vision? N  Difficulty concentrating or making decisions? N  Walking or climbing stairs? N  Dressing or bathing? N  Doing errands, shopping? N  Preparing Food and eating ? N  Using the Toilet? N  In the past six months, have you accidently leaked urine? N  Do you have problems with loss of bowel control? N  Managing your Medications? N  Managing your Finances? N  Housekeeping or managing your Housekeeping? N  Some recent data might be hidden    Patient Care Team: Loyola Mast, MD as PCP - General (Family Medicine) Jens Som Madolyn Frieze, MD as Consulting Physician (Cardiology) Freddie Breech, DPM as Consulting Physician (Podiatry)  Indicate any recent Medical Services you may have received from other than [facility] providers in the past year (date may be approximate).     Assessment:   This is a routine wellness examination for Katherine Parker.  Hearing/Vision screen Vision Screening - Comments:: Referral placed for eye doctor   Dietary issues and exercise activities discussed: Current Exercise Habits: Home exercise routine, Type of exercise: walking, Time (Minutes): 30, Frequency (Times/Week): 3, Weekly Exercise (Minutes/Week): 90, Intensity: Mild, Exercise limited by: None identified   Goals Addressed   None    Depression Screen PHQ 2/9 Scores 02/23/2021 02/23/2021 01/19/2021 06/05/2020  PHQ - 2 Score 0 0 0 3  PHQ- 9 Score - - 9 12    Fall Risk Fall Risk  02/23/2021  Falls in  the past year? 0  Number falls in past yr: 0  Injury with Fall? 0  Risk for fall due to : No Fall Risks  Follow up Falls evaluation completed    FALL RISK PREVENTION PERTAINING TO THE HOME:  Any stairs in or around the home? Yes  If so, are there any without handrails? No  Home free of loose throw rugs in walkways, pet beds, electrical cords, etc? Yes  Adequate lighting in your home to reduce risk of falls? Yes   ASSISTIVE DEVICES UTILIZED TO PREVENT FALLS:  Life alert? No  Use of a cane, walker or w/c? No  Grab bars in the bathroom? No  Shower chair or bench in shower?  No  Elevated toilet seat or a handicapped toilet? No    Cognitive Function:    Normal cognitive status assessed by direct observation by this Nurse Health Advisor. No abnormalities found.      Immunizations Immunization History  Administered Date(s) Administered   Influenza,inj,Quad PF,6+ Mos 10/13/2020   PFIZER(Purple Top)SARS-COV-2 Vaccination 03/04/2019, 04/02/2019   PNEUMOCOCCAL CONJUGATE-20 01/19/2021   Tdap 01/19/2021    TDAP status: Up to date  Flu Vaccine status: Up to date  Pneumococcal vaccine status: Up to date  Covid-19 vaccine status: Completed vaccines  Qualifies for Shingles Vaccine? Yes   Zostavax completed No   Shingrix Completed?: No.    Education has been provided regarding the importance of this vaccine. Patient has been advised to call insurance company to determine out of pocket expense if they have not yet received this vaccine. Advised may also receive vaccine at local pharmacy or Health Dept. Verbalized acceptance and understanding.  Screening Tests Health Maintenance  Topic Date Due   HIV Screening  Never done   Hepatitis C Screening  Never done   COLONOSCOPY (Pts [id number] Insurance coverage will need to be confirmed)  Never done   MAMMOGRAM  Never done   Zoster Vaccines- Shingrix (1 of 2) Never done   COVID-19 Vaccine (3 - Booster for Pfizer series) 05/28/2019   PAP  SMEAR-Modifier  05/22/2020   TETANUS/TDAP  01/20/2031   INFLUENZA VACCINE  Completed   HPV VACCINES  Aged Out    Health Maintenance  Health Maintenance Due  Topic Date Due   HIV Screening  Never done   Hepatitis C Screening  Never done   COLONOSCOPY (Pts [id number] Insurance coverage will need to be confirmed)  Never done   MAMMOGRAM  Never done   Zoster Vaccines- Shingrix (1 of 2) Never done   COVID-19 Vaccine (3 - Booster for Pfizer series) 05/28/2019   PAP SMEAR-Modifier  05/22/2020    Colorectal cancer screening: Referral to GI placed 02/23/2021. Pt aware the office will call re: appt.  Mammogram status: Ordered Scheduled 03/11/2021. Pt provided with contact info and advised to call to schedule appt.   Bone Density status: Ordered not of age . Pt provided with contact info and advised to call to schedule appt.  Lung Cancer Screening: (Low Dose CT Chest recommended if Age 80-80 years, 30 pack-year currently smoking OR have quit w/in 15years.) does not qualify.   Lung Cancer Screening Referral: n/a  Additional Screening:  Hepatitis C Screening: does not qualify;  Vision Screening: Recommended annual ophthalmology exams for early detection of glaucoma and other disorders of the eye. Is the patient up to date with their annual eye exam?  No  Who is the provider or what is the name of the office in which the patient attends annual eye exams? Referral 02/23/2021 If pt is not established with a provider, would they like to be referred to a provider to establish care? No .   Dental Screening: Recommended annual dental exams for proper oral hygiene  Community Resource Referral / Chronic Care Management: CRR required this visit?  No   CCM required this visit?  No      Plan:     I have personally reviewed and noted the following in the patients chart:   Medical and social history Use of alcohol, tobacco or illicit drugs  Current medications and supplements including  opioid prescriptions. Patient is not currently taking opioid prescriptions. Functional ability and status Nutritional status Physical activity Advanced directives  List of other physicians Hospitalizations, surgeries, and ER visits in previous 12 months Vitals Screenings to include cognitive, depression, and falls Referrals and appointments  In addition, I have reviewed and discussed with patient certain preventive protocols, quality metrics, and best practice recommendations. A written personalized care plan for preventive services as well as general preventive health recommendations were provided to patient.     March Rummage, LPN   [id number]   Nurse Notes: none

## 2021-03-11 ENCOUNTER — Ambulatory Visit: Payer: Medicare Other

## 2021-03-24 ENCOUNTER — Encounter: Payer: Self-pay | Admitting: Neurology

## 2021-03-24 ENCOUNTER — Ambulatory Visit: Payer: Medicare Other | Admitting: Neurology

## 2021-03-24 VITALS — BP 161/91 | HR 79 | Ht 66.5 in | Wt 348.0 lb

## 2021-03-24 DIAGNOSIS — G4733 Obstructive sleep apnea (adult) (pediatric): Secondary | ICD-10-CM | POA: Insufficient documentation

## 2021-03-24 DIAGNOSIS — F8081 Childhood onset fluency disorder: Secondary | ICD-10-CM

## 2021-03-24 DIAGNOSIS — F418 Other specified anxiety disorders: Secondary | ICD-10-CM | POA: Diagnosis not present

## 2021-03-24 MED ORDER — VENLAFAXINE HCL ER 75 MG PO CP24: 75.0000 mg | ORAL_CAPSULE | Freq: Every day | ORAL | 3 refills | Status: DC

## 2021-03-24 NOTE — Progress Notes (Signed)
? ?Chief Complaint  ?Patient presents with  ? New Patient (Initial Visit)  ?  Rm 15. Alone. ?NP/ED referral for Speech disturbance/stuttering. ?Pt states stuttering worsens when upset or stressed.  ? ? ? ? ?ASSESSMENT AND PLAN ? ?Katherine Parker is a 56 y.o. female   ?Intermittent stuttering of speech in the setting of worsening depression anxiety ? Normal MRI of the brain, ? Laboratory evaluations showed no significant abnormality including normal TSH, CMP, with exception of mildly elevated glucose 132, CBC, ? Reported stuttering speech most likely related to her underlying mood disorder, started Effexor ER 75 mg daily, she will continue follow-up with primary care/psychiatrist ? ?Obstructive sleep apnea ? Long history of loud snoring, choking during sleep, excessive daytime sleepiness, fatigue, obesity, narrow oropharyngeal space ? Refer to sleep study ? ? ?DIAGNOSTIC DATA (LABS, IMAGING, TESTING) ?- I reviewed patient records, labs, notes, testing and imaging myself where available. ? ?MRI of the brain without contrast in November 2022 was normal ? ?Laboratory evaluation in 2022 showed normal TSH, mild elevated A1c 6.0, CMP showed mild elevated glucose, otherwise normal, normal CBC ? ? ?MEDICAL HISTORY: ? ?Katherine Jeanonya Brick is a 56 year old female, seen in request by her primary care PA Dartha LodgeFord, Kelsey N, for evaluation of stuttering of speech, difficulty sleeping, initial evaluation was March 23, 2020 ? ?I reviewed and summarized the referring note. PMHX. ?Tachycardia ?Depression, Anxiety, she was treated with prozac, lithium in the past,  ?Morbidly obese ? ?She reported long history of depression anxiety, 30 years ago, she was hospitalized for multiple times due to her severe mental disorder, she was treated with different medications in the past, reported BPM, Prozac and other medications that she could no longer recall the name.  She stopped the treatment few years ago worried about the side effect, excessive weight  gain ? 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 ?She had long history of difficulty sleeping, loud snoring for many years, getting worse over the past few months, she reported difficulty falling and staying asleep, excessive daytime fatigue, sleepiness, she often described very uncomfortable feelings when she was drifting to sleep, she felt she was caught in between sleep and awakening, she came here to surroundings, but could not move or crying out for help, this make her almost scared to go into sleep, worry about that she might not be able to wake up from it ? ? ?PHYSICAL EXAM: ?  ?Vitals:  ? 03/24/21 1125  ?BP: (!) 161/91  ?Pulse: 79  ?Weight: (!) 348 lb (157.9 kg)  ?Height: 5' 6.5" (1.689 m)  ? ? ? ?Body mass index is 55.33 kg/m?. ? ?PHYSICAL EXAMNIATION: ? ?Gen: NAD, conversant, well nourised, well groomed                     ?Cardiovascular: Regular rate rhythm, no peripheral edema, warm, nontender. ?Eyes: Conjunctivae clear without exudates or hemorrhage ?Neck: Supple, no carotid bruits. ?Pulmonary: Clear to auscultation bilaterally  ? ?NEUROLOGICAL EXAM: ? ?MENTAL STATUS: Morbidly obese ?Speech: ?   Speech is normal; fluent and spontaneous with normal comprehension.  ?Cognition: ?    Orientation to time, place and person ?    Normal recent and remote memory ?    Normal Attention span and concentration ?    Normal  Language, naming, repeating,spontaneous speech ?    Fund of knowledge ?  ?CRANIAL NERVES: ?CN II: Visual fields are full to confrontation. Pupils are round equal and briskly reactive to light. ?CN III, IV, VI: extraocular movement are normal. No ptosis. ?CN V: Facial sensation is intact to light touch ?CN VII: Face is symmetric with normal eye closure  ?CN VIII: Hearing is  normal to causal conversation. ?CN IX, X: Phonation is normal. ?CN XI: Head turning and shoulder shrug are intact ?CN XII: Narrow oropharyngeal space ? ?MOTOR: ?There is no pronator drift of out-stretched arms. Muscle bulk and tone are normal. Muscle strength is normal. ? ?REFLEXES: ?Reflexes are 2+ and symmetric at the biceps, triceps, knees, and ankles. Plantar responses are flexor. ? ?SENSORY: ?Intact to light touch, pinprick and vibratory sensation are intact in fingers and toes. ? ?COORDINATION: ?There is no trunk or limb dysmetria noted. ? ?GAIT/STANCE: ?Posture is normal. Gait is steady limited by her big body habitus   ? ?REVIEW OF SYSTEMS:  ?Full 14 system review of systems performed and notable only for as above ?All other review of systems were negative. ? ? ?ALLERGIES: ?Allergies  ?Allergen Reactions  ? Penicillin G   ?  Other reaction(s): Unknown  ? Penicillins   ?  Did it involve swelling of the face/tongue/throat, SOB, or low BP? Unknown ?Did it involve sudden or severe rash/hives, skin peeling, or any reaction on the inside of your mouth or nose? Unknown ?Did you need to seek medical attention at a hospital or doctor's office? Unknown ?When did it last happen?  Childhood    ?If all above answers are "NO", may proceed with cephalosporin use. ? ?  ? ? ?HOME MEDICATIONS: ?Current Outpatient Medications  ?Medication Sig Dispense Refill  ? acetaminophen (TYLENOL) 325 MG tablet Take 650 mg by mouth every 6 (six) hours as needed for moderate pain or headache.    ? albuterol (VENTOLIN HFA) 108 (90 Base) MCG/ACT inhaler Inhale 2 puffs into the lungs every 6 (six) hours as needed for wheezing or shortness of breath.    ? cyclobenzaprine (FLEXERIL) 5 MG tablet Take 1 tablet (5 mg total) by mouth 3 (three) times daily as needed for muscle spasms. 30 tablet 1  ? Melatonin 10 MG TABS Take 10 mg by mouth at bedtime as needed (sleep).    ? metoprolol succinate (TOPROL XL) 25 MG 24 hr tablet Take 1 tablet (25 mg  total) by mouth daily. 90 tablet 3  ? terbinafine (LAMISIL) 250 MG tablet Take 1 tablet (250 mg total) by mouth daily. 90 tablet 0  ? ?No current facility-administered medications for this visit.  ? ? ?PAST MEDICAL HISTORY: ?Past Medical History:  ?Diagnosis Date  ? Anxiety   ? CHF (congestive heart failure) (HCC)   ? Hypertension   ? SVT (supraventricular tachycardia) (HCC)   ? ? ?PAST SURGICAL HISTORY: ?Past Surgical History:  ?Procedure Laterality Date  ? CESAREAN SECTION    ? GUM SURGERY    ? TUBAL LIGATION    ? ? ?FAMILY HISTORY: ?Family History  ?Problem Relation Age of Onset  ? Heart disease Mother   ? COPD Mother   ? Congestive Heart Failure Mother   ? Hypertension Mother   ? Diabetes Mother   ? Dementia Father   ? Diabetes Maternal Grandmother   ? Colon cancer Maternal Grandmother   ? Dementia Paternal Grandmother   ? Dementia Paternal Grandfather   ? Cancer Half-Sister   ?  Breast, brain metastasis  ? Cancer Half-Sister   ?     Uterine  ? ? ?SOCIAL HISTORY: ?Social History  ? ?Socioeconomic History  ? Marital status: Single  ?  Spouse name: Not on file  ? Number of children: 2  ? Years of education: Not on file  ? Highest education level: High school graduate  ?Occupational History  ? Occupation: Unemployed  ?Tobacco Use  ? Smoking status: Never  ? Smokeless tobacco: Never  ?Vaping Use  ? Vaping Use: Never used  ?Substance and Sexual Activity  ? Alcohol use: Never  ? Drug use: Never  ? Sexual activity: Not Currently  ?Other Topics Concern  ? Not on file  ?Social History Narrative  ? Not on file  ? ?Social Determinants of Health  ? ?Financial Resource Strain: Low Risk   ? Difficulty of Paying Living Expenses: Not hard at all  ?Food Insecurity: No Food Insecurity  ? Worried About Programme researcher, broadcasting/film/video in the Last Year: Never true  ? Ran Out of Food in the Last Year: Never true  ?Transportation Needs: No Transportation Needs  ? Lack of Transportation (Medical): No  ? Lack of Transportation (Non-Medical): No   ?Physical Activity: Sufficiently Active  ? Days of Exercise per Week: 4 days  ? Minutes of Exercise per Session: 60 min  ?Stress: No Stress Concern Present  ? Feeling of Stress : Not at all  ?Social Connection

## 2021-04-19 ENCOUNTER — Encounter: Payer: Medicare Other | Admitting: Family Medicine

## 2021-04-23 ENCOUNTER — Other Ambulatory Visit: Payer: Self-pay | Admitting: Neurology

## 2021-04-27 ENCOUNTER — Ambulatory Visit (INDEPENDENT_AMBULATORY_CARE_PROVIDER_SITE_OTHER): Payer: Medicare Other | Admitting: Family Medicine

## 2021-04-27 ENCOUNTER — Other Ambulatory Visit (HOSPITAL_COMMUNITY)
Admission: RE | Admit: 2021-04-27 | Discharge: 2021-04-27 | Disposition: A | Discharge status: Home / Self Care | Payer: Medicare Other | Source: Ambulatory Visit | Attending: Family Medicine | Admitting: Family Medicine

## 2021-04-27 ENCOUNTER — Telehealth: Payer: Self-pay | Admitting: Family Medicine

## 2021-04-27 VITALS — BP 134/72 | HR 81 | Temp 97.4°F | Ht 66.45 in | Wt 337.0 lb

## 2021-04-27 DIAGNOSIS — I471 Supraventricular tachycardia: Secondary | ICD-10-CM | POA: Diagnosis not present

## 2021-04-27 DIAGNOSIS — Z01419 Encounter for gynecological examination (general) (routine) without abnormal findings: Secondary | ICD-10-CM | POA: Diagnosis present

## 2021-04-27 DIAGNOSIS — Z124 Encounter for screening for malignant neoplasm of cervix: Secondary | ICD-10-CM

## 2021-04-27 DIAGNOSIS — F331 Major depressive disorder, recurrent, moderate: Secondary | ICD-10-CM

## 2021-04-27 DIAGNOSIS — Z1151 Encounter for screening for human papillomavirus (HPV): Secondary | ICD-10-CM | POA: Insufficient documentation

## 2021-04-27 DIAGNOSIS — R7303 Prediabetes: Secondary | ICD-10-CM

## 2021-04-27 DIAGNOSIS — I10 Essential (primary) hypertension: Secondary | ICD-10-CM

## 2021-04-27 DIAGNOSIS — Z6841 Body Mass Index (BMI) 40.0 and over, adult: Secondary | ICD-10-CM

## 2021-04-27 MED ORDER — METOPROLOL SUCCINATE ER 25 MG PO TB24: 25.0000 mg | ORAL_TABLET | Freq: Every day | ORAL | 3 refills | Status: DC

## 2021-04-27 MED ORDER — RYBELSUS 7 MG PO TABS
7.0000 mg | ORAL_TABLET | Freq: Every day | ORAL | 3 refills | Status: DC
Start: 2021-04-27 — End: 2021-07-27

## 2021-04-27 MED ORDER — VENLAFAXINE BESYLATE ER 112.5 MG PO TB24: 1.0000 | ORAL_TABLET | Freq: Every day | ORAL | 3 refills | Status: DC

## 2021-04-27 MED ORDER — RYBELSUS 3 MG PO TABS
3.0000 mg | ORAL_TABLET | Freq: Every day | ORAL | 0 refills | Status: DC
Start: 2021-04-27 — End: 2021-06-25

## 2021-04-27 MED ORDER — VENLAFAXINE HCL ER 150 MG PO CP24: 150.0000 mg | ORAL_CAPSULE | Freq: Every day | ORAL | 3 refills | Status: DC

## 2021-04-27 NOTE — Telephone Encounter (Signed)
[facility] called stated that the venlafaxine extended release at the dose sent will cost the pt $300 for the 90 days. She has used ER 75 mg capsules that is what her insurance will cover. Is there an alternative if that won't work? [phone] ?

## 2021-04-27 NOTE — Telephone Encounter (Signed)
Patient notified VIA phone. Dm/cma  

## 2021-04-27 NOTE — Progress Notes (Signed)
? PRIMARY CARE ?[facility] ?[address] ?[place] [address] ?Dept: [phone] ?Dept Fax: [phone] ? ?Chronic Care Office Visit ? ?Subjective:  ? ? Patient ID: Katherine Parker, female    DOB: February 14, 1965, 56 y.o..   MRN: [id number] ? ?Chief Complaint  ?Patient presents with  ? Follow-up  ?  3 month f/u.   ? ? ?History of Present Illness: ? ?Patient is in today for reassessment of chronic medical issues. ? ?Katherine Parker has a history of hypertension and a hypertrophic cardiomyopathy without sign of heart failure. She is followed by Dr. Jens Som. She has also had some episodes of PSVT. She had been using metoprolol for rate control, but had recently stopped. She had also been on lisinopril, but stopped this last Fall. ?  ?Katherine Parker has a history of recurrent depression. She is managed on venlafaxine. She feels that this is not as effective currently. She has had increased stressors related to the care she provides for her mother and her sister. ? ?Katherine Parker continues to struggle with obesity. She is worried about her becoming a burden to her family when she gets older, knowing how she struggles with helping her mother, who is also obese. Katherine Parker has mild hyperlipidemia, hypertension, and prediabetes, which are all impacted by her weight. She is also pending evaluation for likely obstructive sleep apnea. ? ?Past Medical History: ?Patient Active Problem List  ? Diagnosis Date Noted  ? Obstructive sleep apnea 03/24/2021  ? Stutter 03/24/2021  ? Paroxysmal SVT (supraventricular tachycardia) (HCC) 11/18/2020  ? Normocytic anemia 10/20/2020  ? Spondylolisthesis at L5-S1 level 10/20/2020  ? Prediabetes 10/20/2020  ? Hypertrophic cardiomyopathy (HCC) 10/13/2020  ? Morbid obesity with BMI of 50.0-59.9, adult (HCC) 10/13/2020  ? Essential hypertension 10/13/2020  ? Chronic right-sided low back pain with right-sided sciatica 10/13/2020  ? Borderline hyperlipidemia 03/09/2020  ? Insomnia  03/09/2020  ? Mild intermittent asthma 03/09/2020  ? Moderate recurrent major depression (HCC) 03/09/2020  ? ?Past Surgical History:  ?Procedure Laterality Date  ? CESAREAN SECTION    ? GUM SURGERY    ? TUBAL LIGATION    ? ?Family History  ?Problem Relation Age of Onset  ? Heart disease Mother   ? COPD Mother   ? Congestive Heart Failure Mother   ? Hypertension Mother   ? Diabetes Mother   ? Dementia Father   ? Diabetes Maternal Grandmother   ? Colon cancer Maternal Grandmother   ? Dementia Paternal Grandmother   ? Dementia Paternal Grandfather   ? Cancer Half-Sister   ?     Breast, brain metastasis  ? Cancer Half-Sister   ?     Uterine  ? ?Outpatient Medications Prior to Visit  ?Medication Sig Dispense Refill  ? acetaminophen (TYLENOL) 325 MG tablet Take 650 mg by mouth every 6 (six) hours as needed for moderate pain or headache.    ? albuterol (VENTOLIN HFA) 108 (90 Base) MCG/ACT inhaler Inhale 2 puffs into the lungs every 6 (six) hours as needed for wheezing or shortness of breath.    ? Melatonin 10 MG TABS Take 10 mg by mouth at bedtime as needed (sleep).    ? terbinafine (LAMISIL) 250 MG tablet Take 1 tablet (250 mg total) by mouth daily. 90 tablet 0  ? venlafaxine XR (EFFEXOR-XR) 75 MG 24 hr capsule TAKE 1 CAPSULE BY MOUTH DAILY WITH BREAKFAST. 90 capsule 2  ? cyclobenzaprine (FLEXERIL) 5 MG tablet Take 1 tablet (5 mg total) by mouth  3 (three) times daily as needed for muscle spasms. (Patient not taking: Reported on 04/27/2021) 30 tablet 1  ? metoprolol succinate (TOPROL XL) 25 MG 24 hr tablet Take 1 tablet (25 mg total) by mouth daily. (Patient not taking: Reported on 04/27/2021) 90 tablet 3  ? ?No facility-administered medications prior to visit.  ? ?Allergies  ?Allergen Reactions  ? Penicillin G   ?  Other reaction(s): Unknown  ? Penicillins   ?  Did it involve swelling of the face/tongue/throat, SOB, or low BP? Unknown ?Did it involve sudden or severe rash/hives, skin peeling, or any reaction on the inside  of your mouth or nose? Unknown ?Did you need to seek medical attention at a hospital or doctor's office? Unknown ?When did it last happen?  Childhood    ?If all above answers are "NO", may proceed with cephalosporin use. ? ?  ?  ?Objective:  ? ?Today's Vitals  ? 04/27/21 0907  ?BP: 134/72  ?Pulse: 81  ?Temp: (!) 97.4 ?F (36.3 ?C)  ?TempSrc: Temporal  ?SpO2: 97%  ?Weight: (!) 337 lb (152.9 kg)  ?Height: 5' 6.45" (1.688 m)  ? ?Body mass index is 53.66 kg/m?.  ? ?General: Well developed, well nourished. No acute distress. ?GU: Normal external genitalia. Vaginal vault is pink without discharge. Cervix is pink and appears  ? normal. No CMT. ?Psych: Alert and oriented. Normal mood and affect. ? ?Health Maintenance Due  ?Topic Date Due  ? HIV Screening  Never done  ? Hepatitis C Screening  Never done  ? COLONOSCOPY (Pts [phone] Insurance coverage will need to be confirmed)  Never done  ? MAMMOGRAM  Never done  ? Zoster Vaccines- Shingrix (1 of 2) Never done  ? COVID-19 Vaccine (3 - Booster for Pfizer series) 05/28/2019  ? PAP SMEAR-Modifier  05/22/2020  ? ? ?  04/27/2021  ?  9:21 AM 02/23/2021  ?  8:25 AM 02/23/2021  ?  8:22 AM  ?Depression screen PHQ 2/9  ?Decreased Interest 2 0 0  ?Down, Depressed, Hopeless 2 0 0  ?PHQ - 2 Score 4 0 0  ?Altered sleeping 2    ?Tired, decreased energy 1    ?Change in appetite 3    ?Feeling bad or failure about yourself  2    ?Trouble concentrating 2    ?Moving slowly or fidgety/restless 0    ?Suicidal thoughts 0    ?PHQ-9 Score 14    ?Difficult doing work/chores Not difficult at all    ? ? ?  04/27/2021  ?  9:21 AM 01/19/2021  ? 10:22 AM 06/05/2020  ? 10:42 AM  ?GAD 7 : Generalized Anxiety Score  ?Nervous, Anxious, on Edge 1 0 0  ?Control/stop worrying 2 0 0  ?Worry too much - different things 2 1 1   ?Trouble relaxing 2 1 1   ?Restless 2 0 0  ?Easily annoyed or irritable 1 1 0  ?Afraid - awful might happen 0 0 0  ?Total GAD 7 Score 10 3 2   ?Anxiety Difficulty Not difficult at all Not difficult at  all Somewhat difficult  ?   ?Assessment & Plan:  ? ?1. Essential hypertension ?Ms. Strickland blood pressure is mildly high today. If this persists at her next visit, I would consider adding lisinopril back to her regimen.  ? ?2. Paroxysmal SVT (supraventricular tachycardia) (HCC) ?Once again, I recommend ms. Penza restart her metoprolol. This may help with her blood pressure as well. ? ?- metoprolol succinate (TOPROL XL) 25 MG 24  hr tablet; Take 1 tablet (25 mg total) by mouth daily.  Dispense: 90 tablet; Refill: 3 ? ?3. Moderate recurrent major depression (HCC) ?Ms. Carone' PHQ is higher than previous. I will try increasing her venlafaxine dose. ? ?- Venlafaxine Besylate ER 112.5 MG TB24; Take 1 tablet by mouth daily.  Dispense: 90 tablet; Refill: 3 ? ?4. Morbid obesity with BMI of 50.0-59.9, adult (HCC) ?Ms. Parady is concerned about long-term sequelae of her weight. I will refer her to Weight Management clinic to assist in lifestyle changes and other approaches to weight loss. ? ?- Amb Ref to Medical Weight Management ? ?5. Prediabetes ?Ms. Salonga is at high risk for progression to Type 2 diabetes and has multiple other conditions associated with metabolic syndrome. I will start her on Rybelsus to try and prevent progression and to assist with weight loss. ? ?- Semaglutide (RYBELSUS) 3 MG TABS; Take 3 mg by mouth daily.  Dispense: 30 tablet; Refill: 0 ?- Semaglutide (RYBELSUS) 7 MG TABS; Take 7 mg by mouth daily. Start after 30 days of the 3 mg dose.  Dispense: 30 tablet; Refill: 3 ? ?6. Screening for cervical cancer ? ?- Cytology - PAP ? ?Return in about 3 months (around 07/27/2021) for Reassessment.  ? ?Loyola Mast, MD ?

## 2021-04-30 LAB — CYTOLOGY - PAP
Adequacy: ABSENT
Comment: NEGATIVE
Diagnosis: NEGATIVE
High risk HPV: NEGATIVE

## 2021-05-07 ENCOUNTER — Ambulatory Visit: Payer: Self-pay | Admitting: Podiatry

## 2021-05-07 DIAGNOSIS — Z91199 Patient's noncompliance with other medical treatment and regimen due to unspecified reason: Secondary | ICD-10-CM

## 2021-05-08 NOTE — Progress Notes (Signed)
   Complete physical exam  Patient: Katherine Parker   DOB: 10/23/2000   56 y.o. Female  MRN: [id number]  Subjective:    No chief complaint on file.   Katherine Parker is a 56 y.o. female who presents today for a complete physical exam. She reports consuming a {diet types:17450} diet. {types:19826} She generally feels {DESC; WELL/FAIRLY WELL/POORLY:18703}. She reports sleeping {DESC; WELL/FAIRLY WELL/POORLY:18703}. She {does/does not:200015} have additional problems to discuss today.    Most recent fall risk assessment:    06/30/2021   10:42 AM  Fall Risk   Falls in the past year? 0  Number falls in past yr: 0  Injury with Fall? 0  Risk for fall due to : No Fall Risks  Follow up Falls evaluation completed     Most recent depression screenings:    06/30/2021   10:42 AM 05/21/2020   10:46 AM  PHQ 2/9 Scores  PHQ - 2 Score 0 0  PHQ- 9 Score 5     {VISON DENTAL STD PSA (Optional):27386}  {History (Optional):23778}  Patient Care Team: Jessup, Joy, NP as PCP - General (Nurse Practitioner)   Outpatient Medications Prior to Visit  Medication Sig   fluticasone (FLONASE) 50 MCG/ACT nasal spray Place 2 sprays into both nostrils in the morning and at bedtime. After 7 days, reduce to once daily.   norgestimate-ethinyl estradiol (SPRINTEC 28) 0.25-35 MG-MCG tablet Take 1 tablet by mouth daily.   Nystatin POWD Apply liberally to affected area 2 times per day   spironolactone (ALDACTONE) 100 MG tablet Take 1 tablet (100 mg total) by mouth daily.   No facility-administered medications prior to visit.    ROS        Objective:     There were no vitals taken for this visit. {Vitals History (Optional):23777}  Physical Exam   No results found for any visits on 08/05/21. {Show previous labs (optional):23779}    Assessment & Plan:    Routine Health Maintenance and Physical Exam  Immunization History  Administered Date(s) Administered   DTaP 01/06/1999, 03/04/1999,  05/13/1999, 01/27/2000, 08/12/2003   Hepatitis A 06/08/2007, 06/13/2008   Hepatitis B 10/24/1998, 12/01/1998, 05/13/1999   HiB (PRP-OMP) 01/06/1999, 03/04/1999, 05/13/1999, 01/27/2000   IPV 01/06/1999, 03/04/1999, 11/01/1999, 08/12/2003   Influenza,inj,Quad PF,6+ Mos 09/13/2013   Influenza-Unspecified 12/14/2011   MMR 10/31/2000, 08/12/2003   Meningococcal Polysaccharide 06/13/2011   Pneumococcal Conjugate-13 01/27/2000   Pneumococcal-Unspecified 05/13/1999, 07/27/1999   Tdap 06/13/2011   Varicella 11/01/1999, 06/08/2007    Health Maintenance  Topic Date Due   HIV Screening  Never done   Hepatitis C Screening  Never done   INFLUENZA VACCINE  08/03/2021   PAP-Cervical Cytology Screening  08/05/2021 (Originally 10/23/2019)   PAP SMEAR-Modifier  08/05/2021 (Originally 10/23/2019)   TETANUS/TDAP  08/05/2021 (Originally 06/12/2021)   HPV VACCINES  Discontinued   COVID-19 Vaccine  Discontinued    Discussed health benefits of physical activity, and encouraged her to engage in regular exercise appropriate for her age and condition.  Problem List Items Addressed This Visit   None Visit Diagnoses     Annual physical exam    -  Primary   Cervical cancer screening       Need for Tdap vaccination          No follow-ups on file.     Joy Jessup, NP   

## 2021-06-25 ENCOUNTER — Other Ambulatory Visit: Payer: Self-pay | Admitting: Family Medicine

## 2021-06-25 DIAGNOSIS — R7303 Prediabetes: Secondary | ICD-10-CM

## 2021-06-28 ENCOUNTER — Emergency Department (HOSPITAL_COMMUNITY): Payer: Medicare Other

## 2021-06-28 ENCOUNTER — Encounter (HOSPITAL_COMMUNITY): Payer: Self-pay | Admitting: Emergency Medicine

## 2021-06-28 ENCOUNTER — Emergency Department (HOSPITAL_COMMUNITY)
Admission: EM | Admit: 2021-06-28 | Discharge: 2021-06-28 | Disposition: A | Discharge status: Home / Self Care | Payer: Medicare Other | Attending: Emergency Medicine | Admitting: Emergency Medicine

## 2021-06-28 DIAGNOSIS — W19XXXA Unspecified fall, initial encounter: Secondary | ICD-10-CM

## 2021-06-28 DIAGNOSIS — W01198A Fall on same level from slipping, tripping and stumbling with subsequent striking against other object, initial encounter: Secondary | ICD-10-CM | POA: Diagnosis not present

## 2021-06-28 DIAGNOSIS — S0990XA Unspecified injury of head, initial encounter: Secondary | ICD-10-CM | POA: Diagnosis present

## 2021-06-28 DIAGNOSIS — S060X0A Concussion without loss of consciousness, initial encounter: Secondary | ICD-10-CM | POA: Diagnosis not present

## 2021-06-28 DIAGNOSIS — S39012A Strain of muscle, fascia and tendon of lower back, initial encounter: Secondary | ICD-10-CM | POA: Diagnosis not present

## 2021-06-28 DIAGNOSIS — T148XXA Other injury of unspecified body region, initial encounter: Secondary | ICD-10-CM

## 2021-06-28 MED ORDER — HYDROCODONE-ACETAMINOPHEN 5-325 MG PO TABS
2.0000 | ORAL_TABLET | Freq: Once | ORAL | Status: AC
  Administered 2021-06-28: 2 via ORAL
  Filled 2021-06-28: qty 2

## 2021-06-28 MED ORDER — METHOCARBAMOL 500 MG PO TABS: 500.0000 mg | ORAL_TABLET | Freq: Two times a day (BID) | ORAL | 0 refills | Status: DC

## 2021-06-28 MED ORDER — NAPROXEN 375 MG PO TABS: 375.0000 mg | ORAL_TABLET | Freq: Two times a day (BID) | ORAL | 0 refills | Status: DC

## 2021-06-28 MED ORDER — HYDROCODONE-ACETAMINOPHEN 5-325 MG PO TABS: 1.0000 | ORAL_TABLET | ORAL | 0 refills | Status: DC | PRN

## 2021-06-28 NOTE — ED Notes (Signed)
Patient c/o nausea. Provided with emesis bag.

## 2021-06-28 NOTE — ED Triage Notes (Signed)
Patient reports trip and fall today hitting head on floor. C/o headache and R lower back pain. Denies LOC.

## 2021-06-30 ENCOUNTER — Ambulatory Visit: Payer: Medicare Other | Admitting: Family Medicine

## 2021-06-30 VITALS — BP 160/86 | Ht 67.0 in | Wt 338.0 lb

## 2021-06-30 DIAGNOSIS — S060X0A Concussion without loss of consciousness, initial encounter: Secondary | ICD-10-CM

## 2021-06-30 MED ORDER — NAPROXEN 375 MG PO TABS
375.0000 mg | ORAL_TABLET | Freq: Two times a day (BID) | ORAL | 1 refills | Status: DC
Start: 2021-06-30 — End: 2021-11-15

## 2021-06-30 MED ORDER — ONDANSETRON HCL 4 MG PO TABS: 4.0000 mg | ORAL_TABLET | Freq: Three times a day (TID) | ORAL | 1 refills | Status: DC | PRN

## 2021-06-30 MED ORDER — METHOCARBAMOL 500 MG PO TABS: 500.0000 mg | ORAL_TABLET | Freq: Three times a day (TID) | ORAL | 1 refills | Status: DC | PRN

## 2021-06-30 NOTE — Patient Instructions (Signed)
You have a concussion. Most of these resolve within 3 weeks but symptoms can last longer than this. Take tylenol as needed as first line medication for headache. Ondansetron is for nausea and can be taken 3 times a day - consider taking about 30 minutes before you take the naproxen if this upsets your stomach. Take naproxen twice a day with food Methocarbamol as needed for muscle spasms/pain. While nausea, fatigue, blurred vision are common in concussion, if you develop persistent vomiting, weakness or numbness in arms/legs, loss of vision, worsening confusion (all of these are unusual in concussion), call 911. Mental and physical rest are important. After a short period, light cardio (stationary bike, walking, light jogging) may be beneficial as long as it does not worsen your symptoms. Do not do any activities that put you at risk of getting struck in the head. Follow up with me in 2 weeks.

## 2021-06-30 NOTE — Progress Notes (Signed)
PCP: Loyola Mast, MD  Subjective:   HPI: Patient is a 56 y.o. female here for concussion.  Slipped and fell 2 days ago at [facility] and struck her head on the floor, no loss of consciousness.  Reported to the ED after the event and had head CT, cervical CT, thoracic and lumbar spine CT which were all unremarkable for acute abnormalities or fractures.  Lumbar spine did demonstrate anterolisthesis, likely chronic and not acute.  She was diagnosed with a concussion and sent home with narcotics, muscle relaxer, and naproxen to help with muscle aches and headaches.  Today she reports that she continues with headaches averaging about 4 times per day and positive for phonophobia and photophobia with intermittent nausea.  Resting in a dark room with minimal to no sounds helps her headaches.  Muscle aches have worsened since her fall 2 days ago.  Mental and physical activity worsen her symptoms.  No episodes of emesis but does report continued intermittent nausea.  No prior history of concussion.  No focal neurological deficits, acute vision changes, syncope or presyncope, or recent repeat falls since the initial event.  Past Medical History:  Diagnosis Date   Anxiety    CHF (congestive heart failure) (HCC)    Hypertension    SVT (supraventricular tachycardia) (HCC)     Current Outpatient Medications on File Prior to Visit  Medication Sig Dispense Refill   albuterol (VENTOLIN HFA) 108 (90 Base) MCG/ACT inhaler Inhale 2 puffs into the lungs every 6 (six) hours as needed for wheezing or shortness of breath.     HYDROcodone-acetaminophen (NORCO/VICODIN) 5-325 MG tablet Take 1 tablet by mouth every 4 (four) hours as needed for severe pain. 8 tablet 0   Melatonin 10 MG TABS Take 10 mg by mouth at bedtime as needed (sleep).     metoprolol succinate (TOPROL XL) 25 MG 24 hr tablet Take 1 tablet (25 mg total) by mouth daily. 90 tablet 3   RYBELSUS 3 MG TABS TAKE 3 MG BY MOUTH DAILY. 30 tablet 0    Semaglutide (RYBELSUS) 7 MG TABS Take 7 mg by mouth daily. Start after 30 days of the 3 mg dose. 30 tablet 3   terbinafine (LAMISIL) 250 MG tablet Take 1 tablet (250 mg total) by mouth daily. 90 tablet 0   venlafaxine XR (EFFEXOR-XR) 150 MG 24 hr capsule Take 1 capsule (150 mg total) by mouth daily with breakfast. 90 capsule 3   No current facility-administered medications on file prior to visit.    Past Surgical History:  Procedure Laterality Date   CESAREAN SECTION     GUM SURGERY     TUBAL LIGATION      Allergies  Allergen Reactions   Penicillin G     Other reaction(s): Unknown   Penicillins     Did it involve swelling of the face/tongue/throat, SOB, or low BP? Unknown Did it involve sudden or severe rash/hives, skin peeling, or any reaction on the inside of your mouth or nose? Unknown Did you need to seek medical attention at a hospital or doctor's office? Unknown When did it last happen?  Childhood    If all above answers are "NO", may proceed with cephalosporin use.      BP (!) 160/86   Ht 5\' 7"  (1.702 m)   Wt (!) 338 lb (153.3 kg)   BMI 52.94 kg/m       No data to display  No data to display              Objective:  Physical Exam:  Gen: NAD, sitting in dark room with lamp turned on covering her eyes  Normal appearance and not ill-appearing.  Turning on the overhead lights makes her headache return. Pupils equal, round, and reactive to light.  Extraocular movements intact.  No nystagmus noted. Cranial nerves II through XII grossly intact. Muscle tone and strength intact x4, muscle strength 5/5 in all upper and lower extremities. Neurovascularly intact and sensation well preserved in all extremities. Diffuse muscular tenderness to palpation including occiput, trapezius, spinal and paraspinal TTP, bilateral biceps and triceps, and bilateral quadricep muscles. Gait is normal albeit slow to stand from chair and slow to ambulate.  Concussion  symptom evaluation: Total number of symptoms 22/22 Symptom severity 98/132   Assessment & Plan:  1.  Concussion without loss of consciousness - No focal neurological deficits - Continue NSAIDs and Tylenol as needed for headaches and muscle aches - Continue muscle relaxer for muscle spasms, pain, and headaches - Will send in Zofran to help with intermittent nausea - Continue mental and physical rest, can include light cardio such as short walks as tolerated - Avoid bright lights and loud sounds - Avoid risky activities or exercises that would increase risk of repeat falls or head trauma - Can consider behavioral therapy, PT, and/or vestibular therapy in a few weeks as acute symptoms remit - Follow-up in 2 weeks   Chyrel Masson, MD FM PGY 2  Patient seen and examined with resident.  Agree with his note and findings.  Patient's first concussion.  Due to fall on 6/26.  Discussed mental, physical relative rest.  Tylenol, ondansetron, naproxen, methocarbamol.  Reviewed red flags.  Has diffuse muscle strains as well, will follow.  F/u in 2 weeks.  Total visit time 40 minutes including documentation.

## 2021-07-02 ENCOUNTER — Ambulatory Visit (INDEPENDENT_AMBULATORY_CARE_PROVIDER_SITE_OTHER): Payer: Medicare Other

## 2021-07-02 ENCOUNTER — Ambulatory Visit (INDEPENDENT_AMBULATORY_CARE_PROVIDER_SITE_OTHER): Payer: Medicare Other | Admitting: Family Medicine

## 2021-07-02 VITALS — BP 136/80 | HR 85 | Temp 97.1°F | Ht 67.0 in | Wt 343.4 lb

## 2021-07-02 DIAGNOSIS — S7001XD Contusion of right hip, subsequent encounter: Secondary | ICD-10-CM

## 2021-07-02 DIAGNOSIS — S060X0D Concussion without loss of consciousness, subsequent encounter: Secondary | ICD-10-CM

## 2021-07-02 MED ORDER — ACETAMINOPHEN-CODEINE 300-30 MG PO TABS: 1.0000 | ORAL_TABLET | ORAL | 0 refills | Status: DC | PRN

## 2021-07-02 NOTE — Progress Notes (Signed)
[facility] [address] [place] [address] Dept: [phone] Dept Fax: [phone]  Office Visit  Subjective:    Patient ID: Katherine Parker, female    DOB: 12/03/1965, 56 y.o..   MRN: [id number]  Chief Complaint  Patient presents with   Hospitalization Follow-up    Hospital f/u from  06/28/21.  Having pain in lower back on RT side radiating into Rt hip. Has been using the muscle relaxer with little relief.  Feels light headed and still having HA's.     History of Present Illness:  Patient is in today for follow up from a recent fall. Katherine Parker was at [employer] on Monday. She slipped on a wet floor and fell, striking her head and her right side. She was seen  at [facility] ED and had CT scans performed. Due ot an assessment that she had a mild concussion, she was seen on Tuesday by Dr. Andree Coss with Sports Medicine. Katherine Parker does still have some right temporal headache at times. More of an issue has been lower back and right hip pain that started on Tuesday. She notes that it is painful with walking. She has been using Tylenol for pain, as she does not have anyone to help her take care of her mother.  Past Medical History: Patient Active Problem List   Diagnosis Date Noted   Obstructive sleep apnea 03/24/2021   Stutter 03/24/2021   Paroxysmal SVT (supraventricular tachycardia) (HCC) 11/18/2020   Normocytic anemia 10/20/2020   Spondylolisthesis at L5-S1 level 10/20/2020   Prediabetes 10/20/2020   Hypertrophic cardiomyopathy (HCC) 10/13/2020   Morbid obesity with BMI of 50.0-59.9, adult (HCC) 10/13/2020   Essential hypertension 10/13/2020   Chronic right-sided low back pain with right-sided sciatica 10/13/2020   Borderline hyperlipidemia 03/09/2020   Insomnia 03/09/2020   Mild intermittent asthma 03/09/2020   Moderate recurrent major depression (HCC) 03/09/2020   Past Surgical History:  Procedure Laterality Date   CESAREAN  SECTION     GUM SURGERY     TUBAL LIGATION     Family History  Problem Relation Age of Onset   Heart disease Mother    COPD Mother    Congestive Heart Failure Mother    Hypertension Mother    Diabetes Mother    Dementia Father    Diabetes Maternal Grandmother    Colon cancer Maternal Grandmother    Dementia Paternal Grandmother    Dementia Paternal Grandfather    Cancer Half-Sister        Breast, brain metastasis   Cancer Half-Sister        Uterine   Outpatient Medications Prior to Visit  Medication Sig Dispense Refill   albuterol (VENTOLIN HFA) 108 (90 Base) MCG/ACT inhaler Inhale 2 puffs into the lungs every 6 (six) hours as needed for wheezing or shortness of breath.     Melatonin 10 MG TABS Take 10 mg by mouth at bedtime as needed (sleep).     methocarbamol (ROBAXIN) 500 MG tablet Take 1 tablet (500 mg total) by mouth every 8 (eight) hours as needed for muscle spasms. 60 tablet 1   metoprolol succinate (TOPROL XL) 25 MG 24 hr tablet Take 1 tablet (25 mg total) by mouth daily. 90 tablet 3   naproxen (NAPROSYN) 375 MG tablet Take 1 tablet (375 mg total) by mouth 2 (two) times daily with a meal. 60 tablet 1   ondansetron (ZOFRAN) 4 MG tablet Take 1 tablet (4 mg total) by mouth every  8 (eight) hours as needed for nausea or vomiting. 60 tablet 1   RYBELSUS 3 MG TABS TAKE 3 MG BY MOUTH DAILY. 30 tablet 0   Semaglutide (RYBELSUS) 7 MG TABS Take 7 mg by mouth daily. Start after 30 days of the 3 mg dose. 30 tablet 3   terbinafine (LAMISIL) 250 MG tablet Take 1 tablet (250 mg total) by mouth daily. 90 tablet 0   venlafaxine XR (EFFEXOR-XR) 150 MG 24 hr capsule Take 1 capsule (150 mg total) by mouth daily with breakfast. 90 capsule 3   HYDROcodone-acetaminophen (NORCO/VICODIN) 5-325 MG tablet Take 1 tablet by mouth every 4 (four) hours as needed for severe pain. 8 tablet 0   No facility-administered medications prior to visit.   Allergies  Allergen Reactions   Penicillin G     Other  reaction(s): Unknown   Penicillins     Did it involve swelling of the face/tongue/throat, SOB, or low BP? Unknown Did it involve sudden or severe rash/hives, skin peeling, or any reaction on the inside of your mouth or nose? Unknown Did you need to seek medical attention at a hospital or doctor's office? Unknown When did it last happen?  Childhood    If all above answers are "NO", may proceed with cephalosporin use.      Objective:   Today's Vitals   07/02/21 1332  BP: 136/80  Pulse: 85  Temp: (!) 97.1 F (36.2 C)  TempSrc: Temporal  SpO2: 95%  Weight: (!) 343 lb 6.4 oz (155.8 kg)  Height: 5\' 7"  (1.702 m)   Body mass index is 53.78 kg/m.   General: Well developed, well nourished. No acute distress. Back: Straight. Pain over paraspinal muscles in lower lumbar area. Extremities: Generalized tenderness over the right hip area. Ambulating, but with pain. Psych: Alert and oriented. Normal mood and affect.  Health Maintenance Due  Topic Date Due   HIV Screening  Never done   Hepatitis C Screening  Never done   COLONOSCOPY (Pts 45-19yrs Insurance coverage will need to be confirmed)  Never done   MAMMOGRAM  Never done   Zoster Vaccines- Shingrix (1 of 2) Never done   Imaging: CT of Head and Cervical Spine (06/28/2021) IMPRESSION: 1. No acute intracranial pathology. 2. No acute fracture or traumatic malalignment of the cervical spine.  CT of Thoracic and Lumbar Spine (06/28/2021) IMPRESSION: 1. No acute fracture or traumatic malalignment of the thoracolumbar spine. 2. Grade 1 anterolisthesis of L4 on L5 with associated facet arthropathy resulting in mild-to-moderate spinal canal stenosis and mild bilateral neural foraminal stenosis.  Right hip x-ray- No fracture or dislocation. Hip joint appears normal.    Assessment & Plan:   1. Contusion of right hip, subsequent encounter Discussed using physical measures to reduce pain, such as heat and stretches. SI will provide a  small amount og T## for pain management when she feels she can take time away from caring for her mother.  - DG Hip Unilat W OR W/O Pelvis 1V Right - acetaminophen-codeine (TYLENOL #3) 300-30 MG tablet; Take 1 tablet by mouth every 4 (four) hours as needed for moderate pain.  Dispense: 10 tablet; Refill: 0  2. Concussion without loss of consciousness, subsequent encounter Some residual headache, but appears to be improving.  Return in about 1 week (around 07/09/2021), or if symptoms worsen or fail to improve.   09/09/2021, MD

## 2021-07-02 NOTE — Progress Notes (Unsigned)
Initial visit Fall on Monday. Starting Wednesday, had increased right hip pain.

## 2021-07-09 ENCOUNTER — Ambulatory Visit (INDEPENDENT_AMBULATORY_CARE_PROVIDER_SITE_OTHER): Payer: Medicare Other | Admitting: Family Medicine

## 2021-07-09 VITALS — BP 132/84 | HR 80 | Temp 98.1°F | Ht 67.0 in | Wt 342.6 lb

## 2021-07-09 DIAGNOSIS — S7001XD Contusion of right hip, subsequent encounter: Secondary | ICD-10-CM | POA: Diagnosis not present

## 2021-07-09 DIAGNOSIS — G44319 Acute post-traumatic headache, not intractable: Secondary | ICD-10-CM | POA: Diagnosis not present

## 2021-07-09 MED ORDER — PROCHLORPERAZINE MALEATE 5 MG PO TABS: 5.0000 mg | ORAL_TABLET | Freq: Four times a day (QID) | ORAL | 0 refills | Status: DC | PRN

## 2021-07-09 NOTE — Progress Notes (Signed)
[facility] [address] [place] [address] Dept: [phone] Dept Fax: [phone]  Office Visit  Subjective:    Patient ID: Katherine Parker, female    DOB: [birth year], 56 y.o..   MRN: [id number]  Chief Complaint  Patient presents with   Follow-up    1 week f/u.  Still having HA's 3 x daily, only taking Tylenol.      History of Present Illness:  Patient is in today for reassessment of her injuries from a recent fall. I saw her on 6/30. Katherine Parker was at [employer] on Monday, June 26th. She slipped on a wet floor and fell, striking her head and her right side. She was seen  at [facility] ED and had CT scans performed. Due to an assessment that she had a mild concussion, she was seen on Tuesday (6/27) by Dr. Andree Coss with Sports Medicine.   Katherine Parker continues to have some right temporal headache at times. She notes these occur about three times a day. She is avoiding bright lights and loud noises. She is currently needing to provide the care for her mother and sister, as her father-in-law is out of town. Her daughter is trying to help her some with this. However, Katherine Parker has been limiting herself to using Tylenol for her headaches. This is only partially effective.  She also continues to note lower back and right hip pain. She gets occasional twinges of pain. She admits that she has not been able to stay off of her feet much. She also has not been taking the Robaxin she was prescribed for this.  Past Medical History: Patient Active Problem List   Diagnosis Date Noted   Obstructive sleep apnea 03/24/2021   Stutter 03/24/2021   Paroxysmal SVT (supraventricular tachycardia) (HCC) 11/18/2020   Normocytic anemia 10/20/2020   Spondylolisthesis at L5-S1 level 10/20/2020   Prediabetes 10/20/2020   Hypertrophic cardiomyopathy (HCC) 10/13/2020   Morbid obesity with BMI of 50.0-59.9, adult (HCC) 10/13/2020   Essential hypertension  10/13/2020   Chronic right-sided low back pain with right-sided sciatica 10/13/2020   Borderline hyperlipidemia 03/09/2020   Insomnia 03/09/2020   Mild intermittent asthma 03/09/2020   Moderate recurrent major depression (HCC) 03/09/2020   Past Surgical History:  Procedure Laterality Date   CESAREAN SECTION     GUM SURGERY     TUBAL LIGATION     Family History  Problem Relation Age of Onset   Heart disease Mother    COPD Mother    Congestive Heart Failure Mother    Hypertension Mother    Diabetes Mother    Dementia Father    Diabetes Maternal Grandmother    Colon cancer Maternal Grandmother    Dementia Paternal Grandmother    Dementia Paternal Grandfather    Cancer Half-Sister        Breast, brain metastasis   Cancer Half-Sister        Uterine   Outpatient Medications Prior to Visit  Medication Sig Dispense Refill   acetaminophen-codeine (TYLENOL #3) 300-30 MG tablet Take 1 tablet by mouth every 4 (four) hours as needed for moderate pain. 10 tablet 0   albuterol (VENTOLIN HFA) 108 (90 Base) MCG/ACT inhaler Inhale 2 puffs into the lungs every 6 (six) hours as needed for wheezing or shortness of breath.     Melatonin 10 MG TABS Take 10 mg by mouth at bedtime as needed (sleep).     methocarbamol (ROBAXIN) 500 MG tablet Take 1 tablet (  500 mg total) by mouth every 8 (eight) hours as needed for muscle spasms. 60 tablet 1   metoprolol succinate (TOPROL XL) 25 MG 24 hr tablet Take 1 tablet (25 mg total) by mouth daily. 90 tablet 3   naproxen (NAPROSYN) 375 MG tablet Take 1 tablet (375 mg total) by mouth 2 (two) times daily with a meal. 60 tablet 1   ondansetron (ZOFRAN) 4 MG tablet Take 1 tablet (4 mg total) by mouth every 8 (eight) hours as needed for nausea or vomiting. 60 tablet 1   RYBELSUS 3 MG TABS TAKE 3 MG BY MOUTH DAILY. 30 tablet 0   Semaglutide (RYBELSUS) 7 MG TABS Take 7 mg by mouth daily. Start after 30 days of the 3 mg dose. 30 tablet 3   terbinafine (LAMISIL) 250 MG  tablet Take 1 tablet (250 mg total) by mouth daily. 90 tablet 0   venlafaxine XR (EFFEXOR-XR) 150 MG 24 hr capsule Take 1 capsule (150 mg total) by mouth daily with breakfast. 90 capsule 3   No facility-administered medications prior to visit.   Allergies  Allergen Reactions   Penicillin G     Other reaction(s): Unknown   Penicillins     Did it involve swelling of the face/tongue/throat, SOB, or low BP? Unknown Did it involve sudden or severe rash/hives, skin peeling, or any reaction on the inside of your mouth or nose? Unknown Did you need to seek medical attention at a hospital or doctor's office? Unknown When did it last happen?  Childhood    If all above answers are "NO", may proceed with cephalosporin use.     Objective:   Today's Vitals   07/09/21 0927  BP: 132/84  Pulse: 80  Temp: 98.1 F (36.7 C)  TempSrc: Temporal  SpO2: 97%  Weight: (!) 342 lb 9.6 oz (155.4 kg)  Height: 5\' 7"  (1.702 m)   Body mass index is 53.66 kg/m.   General: Well developed, well nourished. No acute distress. HEENT: Normocephalic, non-traumatic. PERRL, EOMI. Conjunctiva clear.  Back: Straight. Mild pain on palpation over right paralumbar muscles. Psych: Alert and oriented. Normal mood and affect.  Health Maintenance Due  Topic Date Due   HIV Screening  Never done   Hepatitis C Screening  Never done   COLONOSCOPY (Pts 45-82yrs Insurance coverage will need to be confirmed)  Never done   MAMMOGRAM  Never done   Zoster Vaccines- Shingrix (1 of 2) Never done     Assessment & Plan:   1. Acute post-traumatic headache, not intractable I recommend Katherine Parker try using Excedrin Tension, which contains acetaminophen and caffeine. I will add some Compazine for nausea. I f not improved by next week, she should reach out to me and we will consider other therapies.  - prochlorperazine (COMPAZINE) 5 MG tablet; Take 1 tablet (5 mg total) by mouth every 6 (six) hours as needed for nausea or vomiting.   Dispense: 30 tablet; Refill: 0  2. Contusion of right hip, subsequent encounter We discussed the x-ray findings. I still feel this mostly involves soft tissue injury and needs more time to resolve. I strongly urged her to stay off her feet and try to get other family to assist with her mother and sister's care. I will see her back in 2 weeks.  Return in about 2 weeks (around 07/23/2021).   07/25/2021, MD

## 2021-07-09 NOTE — Patient Instructions (Addendum)
Take Excedrin Tension for headaches as directed on the label. May use prochlorperazine (Compazine) for nausea associated with headache. Rest right hip and use heat on this when possible.

## 2021-07-19 ENCOUNTER — Encounter: Payer: Self-pay | Admitting: Family Medicine

## 2021-07-19 ENCOUNTER — Ambulatory Visit (INDEPENDENT_AMBULATORY_CARE_PROVIDER_SITE_OTHER): Payer: Medicare Other | Admitting: Family Medicine

## 2021-07-19 VITALS — BP 147/60 | Ht 66.5 in | Wt 340.0 lb

## 2021-07-19 DIAGNOSIS — S060X0D Concussion without loss of consciousness, subsequent encounter: Secondary | ICD-10-CM

## 2021-07-19 DIAGNOSIS — Z0289 Encounter for other administrative examinations: Secondary | ICD-10-CM

## 2021-07-19 NOTE — Progress Notes (Signed)
PCP: Loyola Mast, MD  Subjective:   HPI: Patient is a 56 y.o. female here for concussion.  6/28: Slipped and fell 2 days ago at [facility] and struck her head on the floor, no loss of consciousness.  Reported to the ED after the event and had head CT, cervical CT, thoracic and lumbar spine CT which were all unremarkable for acute abnormalities or fractures.  Lumbar spine did demonstrate anterolisthesis, likely chronic and not acute.  She was diagnosed with a concussion and sent home with narcotics, muscle relaxer, and naproxen to help with muscle aches and headaches.  Today she reports that she continues with headaches averaging about 4 times per day and positive for phonophobia and photophobia with intermittent nausea.  Resting in a dark room with minimal to no sounds helps her headaches.  Muscle aches have worsened since her fall 2 days ago.  Mental and physical activity worsen her symptoms.  No episodes of emesis but does report continued intermittent nausea.  No prior history of concussion.  No focal neurological deficits, acute vision changes, syncope or presyncope, or recent repeat falls since the initial event.  7/17: Patient reports she is improved compared to last visit. Frontal headache her main complaint - comes on without any specific trigger. Taking tylenol for this. Also with nausea and dizziness. SCAT symptoms 22/22 with severity 76/132, improved.  Past Medical History:  Diagnosis Date   Anxiety    CHF (congestive heart failure) (HCC)    Hypertension    SVT (supraventricular tachycardia) (HCC)     Current Outpatient Medications on File Prior to Visit  Medication Sig Dispense Refill   acetaminophen-codeine (TYLENOL #3) 300-30 MG tablet Take 1 tablet by mouth every 4 (four) hours as needed for moderate pain. 10 tablet 0   albuterol (VENTOLIN HFA) 108 (90 Base) MCG/ACT inhaler Inhale 2 puffs into the lungs every 6 (six) hours as needed for wheezing or shortness of  breath.     Melatonin 10 MG TABS Take 10 mg by mouth at bedtime as needed (sleep).     methocarbamol (ROBAXIN) 500 MG tablet Take 1 tablet (500 mg total) by mouth every 8 (eight) hours as needed for muscle spasms. 60 tablet 1   metoprolol succinate (TOPROL XL) 25 MG 24 hr tablet Take 1 tablet (25 mg total) by mouth daily. 90 tablet 3   naproxen (NAPROSYN) 375 MG tablet Take 1 tablet (375 mg total) by mouth 2 (two) times daily with a meal. 60 tablet 1   ondansetron (ZOFRAN) 4 MG tablet Take 1 tablet (4 mg total) by mouth every 8 (eight) hours as needed for nausea or vomiting. 60 tablet 1   prochlorperazine (COMPAZINE) 5 MG tablet Take 1 tablet (5 mg total) by mouth every 6 (six) hours as needed for nausea or vomiting. 30 tablet 0   RYBELSUS 3 MG TABS TAKE 3 MG BY MOUTH DAILY. 30 tablet 0   Semaglutide (RYBELSUS) 7 MG TABS Take 7 mg by mouth daily. Start after 30 days of the 3 mg dose. 30 tablet 3   terbinafine (LAMISIL) 250 MG tablet Take 1 tablet (250 mg total) by mouth daily. 90 tablet 0   venlafaxine XR (EFFEXOR-XR) 150 MG 24 hr capsule Take 1 capsule (150 mg total) by mouth daily with breakfast. 90 capsule 3   No current facility-administered medications on file prior to visit.    Past Surgical History:  Procedure Laterality Date   CESAREAN SECTION     GUM SURGERY  TUBAL LIGATION      Allergies  Allergen Reactions   Penicillin G     Other reaction(s): Unknown   Penicillins     Did it involve swelling of the face/tongue/throat, SOB, or low BP? Unknown Did it involve sudden or severe rash/hives, skin peeling, or any reaction on the inside of your mouth or nose? Unknown Did you need to seek medical attention at a hospital or doctor's office? Unknown When did it last happen?  Childhood    If all above answers are "NO", may proceed with cephalosporin use.      BP (!) 147/60   Ht 5' 6.5" (1.689 m)   Wt (!) 340 lb (154.2 kg)   BMI 54.06 kg/m       No data to display               No data to display              Objective:  Physical Exam:  Gen: NAD, comfortable in exam room  Orientation 5/5 Immediate memory 11/15 Concentration 1/5 Neck FROM with bilateral paraspinal tenderness Balance - mild unsteadiness with double leg stance - deferred tandem and single leg due to instability Finger to nose normal bilaterally Horizontal saccades 20 trials with worsening headache Delayed recall 2/5   Assessment & Plan:  1. Concussion without loss of consciousness - due to fall 6/26.  Start vestibular therapy.  Tylenol, zofran, naproxen, robaxin if needed.  Mental and physical rest.  Discussed amitriptyline but can consider in future.  Plan to f/u in 3-4 weeks.  Total visit time 40 minutes including documentation.

## 2021-07-19 NOTE — Patient Instructions (Signed)
You have a concussion. Start vestibular therapy. Take tylenol as needed as first line medication for headache. Ondansetron is for nausea and can be taken 3 times a day - consider taking about 30 minutes before you take the naproxen if this upsets your stomach. Take naproxen twice a day with food as needed. Methocarbamol as needed for muscle spasms/pain. Mental and physical rest are important. After a short period, light cardio (stationary bike, walking, light jogging) may be beneficial as long as it does not worsen your symptoms. Do not do any activities that put you at risk of getting struck in the head. Follow up with me in 4 weeks.

## 2021-07-21 ENCOUNTER — Other Ambulatory Visit: Payer: Self-pay | Admitting: Podiatrist

## 2021-07-21 NOTE — Telephone Encounter (Signed)
Please advise 

## 2021-07-22 NOTE — Telephone Encounter (Signed)
Patient has been notified and will call back to schedule appointment

## 2021-07-26 ENCOUNTER — Ambulatory Visit: Payer: Medicare Other | Attending: Family Medicine

## 2021-07-26 DIAGNOSIS — R29818 Other symptoms and signs involving the nervous system: Secondary | ICD-10-CM | POA: Diagnosis present

## 2021-07-26 DIAGNOSIS — R2681 Unsteadiness on feet: Secondary | ICD-10-CM | POA: Insufficient documentation

## 2021-07-26 NOTE — Therapy (Signed)
OUTPATIENT PHYSICAL THERAPY VESTIBULAR EVALUATION     Patient Name: Katherine Parker MRN: [id number] DOB:1965/03/11, 56 y.o., female Today's Date: 07/26/2021  PCP: [name] REFERRING PROVIDER: Lenda Kelp, MD    PT End of Session - 07/26/21 1108     Visit Number 1    Number of Visits 12    Date for PT Re-Evaluation 10/18/21    Authorization Type [employer] Medicare    PT Start Time 1105    PT Stop Time 1150    PT Time Calculation (min) 45 min    Activity Tolerance Patient tolerated treatment well    Behavior During Therapy WFL for tasks assessed/performed             Past Medical History:  Diagnosis Date   Anxiety    CHF (congestive heart failure) (HCC)    Hypertension    SVT (supraventricular tachycardia) (HCC)    Past Surgical History:  Procedure Laterality Date   CESAREAN SECTION     GUM SURGERY     TUBAL LIGATION     Patient Active Problem List   Diagnosis Date Noted   Obstructive sleep apnea 03/24/2021   Stutter 03/24/2021   Paroxysmal SVT (supraventricular tachycardia) (HCC) 11/18/2020   Normocytic anemia 10/20/2020   Spondylolisthesis at L5-S1 level 10/20/2020   Prediabetes 10/20/2020   Hypertrophic cardiomyopathy (HCC) 10/13/2020   Morbid obesity with BMI of 50.0-59.9, adult (HCC) 10/13/2020   Essential hypertension 10/13/2020   Chronic right-sided low back pain with right-sided sciatica 10/13/2020   Borderline hyperlipidemia 03/09/2020   Insomnia 03/09/2020   Mild intermittent asthma 03/09/2020   Moderate recurrent major depression (HCC) 03/09/2020    ONSET DATE: 06/28/21  REFERRING DIAG: N82.9F6O (ICD-10-CM) - Concussion without loss of consciousness, subsequent encounter   THERAPY DIAG:  Unsteadiness on feet  Other symptoms and signs involving the nervous system  Rationale for Evaluation and Treatment Rehabilitation  SUBJECTIVE:   SUBJECTIVE STATEMENT: Pt fell at restaurant on 06/29/26 and struck head and was assessed at hospital  which ruled out ICH, stroke, fx, etc.  Chief complaint now is HA and sensitivity to light/sound, reports feeling of nausea which occurs sporadically.  Notes instances of blurry vision. Notes overall improvement since time of injury Pt accompanied by: self  PERTINENT HISTORY:  6/28:  Slipped and fell 2 days ago at [facility] and struck her head on the floor, no loss of consciousness.  Reported to the ED after the event and had head CT, cervical CT, thoracic and lumbar spine CT which were all unremarkable for acute abnormalities or fractures.  Lumbar spine did demonstrate anterolisthesis, likely chronic and not acute.  She was diagnosed with a concussion and sent home with narcotics, muscle relaxer, and naproxen to help with muscle aches and headaches.  Today she reports that she continues with headaches averaging about 4 times per day and positive for phonophobia and photophobia with intermittent nausea.  Resting in a dark room with minimal to no sounds helps her headaches.  Muscle aches have worsened since her fall 2 days ago.  Mental and physical activity worsen her symptoms.  No episodes of emesis but does report continued intermittent nausea.  No prior history of concussion.  No focal neurological deficits, acute vision changes, syncope or presyncope, or recent repeat falls since the initial event.   PAIN:  Are you having pain? Yes: NPRS scale: 5-6/10 Pain location: right lateral neck and left lateral head Pain description: tension in neck, "private drummer in my head", pulsing  pain Aggravating factors: light, sound, movement Relieving factors: sitting quietly in the dark  PRECAUTIONS: None  WEIGHT BEARING RESTRICTIONS No  FALLS: Has patient fallen in last 6 months? Yes. Number of falls 1  LIVING ENVIRONMENT: Lives with: lives with their daughter Lives in: House/apartment Stairs: Yes: Internal: 12 steps; on right going up. 4 steps to enter BHR Has following equipment at home:  None  PLOF: Independent  PATIENT GOALS be able to read, get rid of HA, return to prior activities like the gym  OBJECTIVE:   DIAGNOSTIC FINDINGS: imaging studies negative  COGNITION: Overall cognitive status: Within functional limits for tasks assessed   SENSATION: WFL    POSTURE: anterior pelvic tilt and flexed trunk    Cervical ROM:    Active A/PROM (deg) eval  Flexion WFL  Extension WFL  Right lateral flexion 25% limited  Left lateral flexion 25% limited  Right rotation: 25% limited 25%% limited    Left rotation: 25% limited   (Blank rows = not tested)  STRENGTH:   LOWER EXTREMITY MMT:   (Blank rows = not tested)    GAIT: Gait pattern: WFL Distance walked:  Assistive device utilized: None Level of assistance: Complete Independence Comments:   FUNCTIONAL TESTs:  DGI: 22/24 M-CTSIB: mild sway condition 2, mild-mod sway condition 4  PATIENT SURVEYS:  FOTO 54.19   VESTIBULAR ASSESSMENT   GENERAL OBSERVATION: bilateral lid ptosis?    SYMPTOM BEHAVIOR:   Subjective history: 6/26 fall with hitting head on ground, no LOC   Non-Vestibular symptoms: changes in vision, neck pain, headaches, and nausea/vomiting   Type of dizziness: Blurred Vision and Lightheadedness/Faint   Frequency: daily   Duration: seconds   Aggravating factors: Induced by motion: occur when walking and turning head quickly and Worse in the morning   Relieving factors: head stationary, dark room, closing eyes, and rest   Progression of symptoms: better   OCULOMOTOR EXAM:   Ocular Alignment: normal   Ocular ROM: No Limitations   Spontaneous Nystagmus: absent   Gaze-Induced Nystagmus: absent   Smooth Pursuits: intact   Saccades: intact   Convergence/Divergence: 22 cm  Reports feeling frontal headache with these movements      VESTIBULAR - OCULAR REFLEX:    Slow VOR: Normal and Comment: slow movement with report of increased symptoms   VOR Cancellation: Normal, reports  increased discomfort   Head-Impulse Test: HIT Right: positive HIT Left: negative --Reports feeling increased symptoms   Dynamic Visual Acuity: Not able to be assessed    POSITIONAL TESTING: Denies any positional provocation    MOTION SENSITIVITY: reports 7/10 discomfort with movements    Motion Sensitivity Quotient  Intensity: 0 = none, 1 = Lightheaded, 2 = Mild, 3 = Moderate, 4 = Severe, 5 = Vomiting  Intensity  1. Sitting to supine   2. Supine to L side   3. Supine to R side   4. Supine to sitting   5. L Hallpike-Dix   6. Up from L    7. R Hallpike-Dix   8. Up from R    9. Sitting, head  tipped to L knee   10. Head up from L  knee   11. Sitting, head  tipped to R knee   12. Head up from R  knee   13. Sitting head turns x5   14.Sitting head nods x5   15. In stance, 180  turn to L    16. In stance, 180  turn to R  OTHOSTATICS: not done  FUNCTIONAL GAIT:    VESTIBULAR TREATMENT:  Canalith Repositioning:   Comment: not indicated Gaze Adaptation:   x1 Viewing Horizontal: Position: sitting, Time: 15-30 sec, and Reps: 3-5 and x1 Viewing Vertical:  Position: sitting and Time: 15-30 sec Habituation:   Other: to be initiated as indicated Other:   PATIENT EDUCATION: Education details: vestibular rehabilitation Person educated: Patient Education method: Explanation Education comprehension: verbalized understanding   GOALS: Goals reviewed with patient? Yes  SHORT TERM GOALS: Target date: 09/06/2021   The patient will be independent with HEP for gaze adaptation, habituation, balance, and general mobility. Baseline: Goal status: INITIAL   2. Demonstrate full cervical spine ROM to improve comfort and safety with driving  Baseline: 62% limitation with rotation and lateral flexion due to tightness/pain  Goal status: INITIAL    LONG TERM GOALS: Target date: 10/18/2021    Patient will manifest improved symptoms and function as indicated by score of 61  FOTO assessment Baseline: 54 Goal status: INITIAL  2.  Pt to report improved symptoms as evidenced by ability to perform reading activity without limitation/discomfort Baseline: unable, demo convergence tolerance of 22 cm Goal status: INITIAL  3.  Manifest improved symptoms per report of ability to return to gym routine 3 days/week Baseline: unable to perform at this time Goal status: INITIAL   ASSESSMENT:  CLINICAL IMPRESSION: Patient is a 56 y.o. lady who was seen today for physical therapy evaluation and treatment for balance deficits and other symptoms following recent hx of concussion including neck pain. Demonstrates dysfunction with VOR systems and reports limited activity participation and tolerance due to persistent symptoms requiring activity avoidance and or modification at this time.  Pt would benefit from PT Services to instruct in vestibular rehabilitation activities to return to PLOF and typical level of activity.    OBJECTIVE IMPAIRMENTS decreased activity tolerance, decreased balance, decreased mobility, decreased ROM, dizziness, impaired perceived functional ability, and pain.   ACTIVITY LIMITATIONS bending, locomotion level, and caring for others  PARTICIPATION LIMITATIONS: cleaning, driving, shopping, and community activity  PERSONAL FACTORS Time since onset of injury/illness/exacerbation and 1 comorbidity: CHF  are also affecting patient's functional outcome.   REHAB POTENTIAL: Good  CLINICAL DECISION MAKING: Evolving/moderate complexity  EVALUATION COMPLEXITY: Moderate   PLAN: PT FREQUENCY: 1-2x/week  PT DURATION: 12 weeks  PLANNED INTERVENTIONS: Therapeutic exercises, Therapeutic activity, Neuromuscular re-education, Balance training, Gait training, Patient/Family education, Self Care, Joint mobilization, Stair training, Vestibular training, Canalith repositioning, DME instructions, Dry Needling, and Manual therapy  PLAN FOR NEXT SESSION: neck  ROM/stretching/posture. Continue with VOR   Dion Body, PT 07/26/2021, 12:01 PM

## 2021-07-27 ENCOUNTER — Ambulatory Visit (INDEPENDENT_AMBULATORY_CARE_PROVIDER_SITE_OTHER): Payer: Medicare Other | Admitting: Family Medicine

## 2021-07-27 ENCOUNTER — Encounter: Payer: Self-pay | Admitting: Family Medicine

## 2021-07-27 VITALS — BP 134/70 | HR 90 | Temp 97.9°F | Ht 66.5 in | Wt 345.0 lb

## 2021-07-27 DIAGNOSIS — F331 Major depressive disorder, recurrent, moderate: Secondary | ICD-10-CM

## 2021-07-27 DIAGNOSIS — L80 Vitiligo: Secondary | ICD-10-CM | POA: Insufficient documentation

## 2021-07-27 DIAGNOSIS — S7001XD Contusion of right hip, subsequent encounter: Secondary | ICD-10-CM | POA: Diagnosis not present

## 2021-07-27 DIAGNOSIS — G44319 Acute post-traumatic headache, not intractable: Secondary | ICD-10-CM | POA: Diagnosis not present

## 2021-07-27 DIAGNOSIS — S060X0D Concussion without loss of consciousness, subsequent encounter: Secondary | ICD-10-CM | POA: Diagnosis not present

## 2021-07-27 DIAGNOSIS — Z6841 Body Mass Index (BMI) 40.0 and over, adult: Secondary | ICD-10-CM

## 2021-07-27 NOTE — Progress Notes (Signed)
[facility] [address] [place] [address] Dept: [phone] Dept Fax: [phone]  Office Visit  Subjective:    Patient ID: Katherine Parker, female    DOB: 1965-03-05, 56 y.o..   MRN: [id number]  Chief Complaint  Patient presents with   Follow-up    2 week f/u.  Still having migraines off/on, light headedness, and blurry vision.      History of Present Illness:  Patient is in today for reassessment of her injuries from a fall on 06/28/2021.She had a mild concussion and contusions to her back/hip.   Katherine Parker continues to have some right temporal headache at times. At her last visit, I had recommended she try using Compazine for the nausea. She has not picked this up yet. She does continue to follow with Sports Medicine related to her concussion. She started PT yesterday for vestibular retraining to try and resolve the lightheadedness issues.   Katherine Parker notes the lower back and right hip pain is improved.  Katherine Parker notes she is scheduled with the [facility] and is looking forward to being able to begin focusing more on this issue.  I noted several depigmented areas on the patient's neck. She notes she has a larger patch on her buttocks. She has some family members that have a similar issue.  Past Medical History: Patient Active Problem List   Diagnosis Date Noted   Vitiligo 07/27/2021   Obstructive sleep apnea 03/24/2021   Stutter 03/24/2021   Paroxysmal SVT (supraventricular tachycardia) (HCC) 11/18/2020   Normocytic anemia 10/20/2020   Spondylolisthesis at L5-S1 level 10/20/2020   Prediabetes 10/20/2020   Hypertrophic cardiomyopathy (HCC) 10/13/2020   Morbid obesity with BMI of 50.0-59.9, adult (HCC) 10/13/2020   Essential hypertension 10/13/2020   Chronic right-sided low back pain with right-sided sciatica 10/13/2020   Borderline hyperlipidemia 03/09/2020   Insomnia 03/09/2020   Mild intermittent  asthma 03/09/2020   Moderate recurrent major depression (HCC) 03/09/2020   Past Surgical History:  Procedure Laterality Date   CESAREAN SECTION     GUM SURGERY     TUBAL LIGATION     Family History  Problem Relation Age of Onset   Heart disease Mother    COPD Mother    Congestive Heart Failure Mother    Hypertension Mother    Diabetes Mother    Dementia Father    Diabetes Maternal Grandmother    Colon cancer Maternal Grandmother    Dementia Paternal Grandmother    Dementia Paternal Grandfather    Cancer Half-Sister        Breast, brain metastasis   Cancer Half-Sister        Uterine   Outpatient Medications Prior to Visit  Medication Sig Dispense Refill   acetaminophen-codeine (TYLENOL #3) 300-30 MG tablet Take 1 tablet by mouth every 4 (four) hours as needed for moderate pain. 10 tablet 0   albuterol (VENTOLIN HFA) 108 (90 Base) MCG/ACT inhaler Inhale 2 puffs into the lungs every 6 (six) hours as needed for wheezing or shortness of breath.     Melatonin 10 MG TABS Take 10 mg by mouth at bedtime as needed (sleep).     methocarbamol (ROBAXIN) 500 MG tablet Take 1 tablet (500 mg total) by mouth every 8 (eight) hours as needed for muscle spasms. 60 tablet 1   metoprolol succinate (TOPROL XL) 25 MG 24 hr tablet Take 1 tablet (25 mg total) by mouth daily. 90 tablet 3   naproxen (NAPROSYN)  375 MG tablet Take 1 tablet (375 mg total) by mouth 2 (two) times daily with a meal. 60 tablet 1   ondansetron (ZOFRAN) 4 MG tablet Take 1 tablet (4 mg total) by mouth every 8 (eight) hours as needed for nausea or vomiting. 60 tablet 1   prochlorperazine (COMPAZINE) 5 MG tablet Take 1 tablet (5 mg total) by mouth every 6 (six) hours as needed for nausea or vomiting. 30 tablet 0   RYBELSUS 3 MG TABS TAKE 3 MG BY MOUTH DAILY. 30 tablet 0   Semaglutide (RYBELSUS) 7 MG TABS Take 7 mg by mouth daily. Start after 30 days of the 3 mg dose. 30 tablet 3   terbinafine (LAMISIL) 250 MG tablet Take 1 tablet (250  mg total) by mouth daily. 90 tablet 0   venlafaxine XR (EFFEXOR-XR) 150 MG 24 hr capsule Take 1 capsule (150 mg total) by mouth daily with breakfast. 90 capsule 3   No facility-administered medications prior to visit.   Allergies  Allergen Reactions   Penicillin G     Other reaction(s): Unknown   Penicillins     Did it involve swelling of the face/tongue/throat, SOB, or low BP? Unknown Did it involve sudden or severe rash/hives, skin peeling, or any reaction on the inside of your mouth or nose? Unknown Did you need to seek medical attention at a hospital or doctor's office? Unknown When did it last happen?  Childhood    If all above answers are "NO", may proceed with cephalosporin use.     Objective:   Today's Vitals   07/27/21 0927  BP: 134/70  Pulse: 90  Temp: 97.9 F (36.6 C)  TempSrc: Temporal  SpO2: 97%  Weight: (!) 345 lb (156.5 kg)  Height: 5' 6.5" (1.689 m)   Body mass index is 54.85 kg/m.   General: Well developed, well nourished. No acute distress. Skin: Warm and dry. There are three small irregular patches of hypopigmentation on the right neck. Psych: Alert and oriented. Normal mood and affect.  Health Maintenance Due  Topic Date Due   HIV Screening  Never done   Hepatitis C Screening  Never done   COLONOSCOPY (Pts 45-38yrs Insurance coverage will need to be confirmed)  Never done   MAMMOGRAM  Never done   Zoster Vaccines- Shingrix (1 of 2) Never done        07/27/2021    9:35 AM 04/27/2021    9:21 AM 02/23/2021    8:25 AM  Depression screen PHQ 2/9  Decreased Interest 2 2 0  Down, Depressed, Hopeless 1 2 0  PHQ - 2 Score 3 4 0  Altered sleeping 2 2   Tired, decreased energy 2 1   Change in appetite 3 3   Feeling bad or failure about yourself  2 2   Trouble concentrating 1 2   Moving slowly or fidgety/restless 2 0   Suicidal thoughts 0 0   PHQ-9 Score 15 14   Difficult doing work/chores Somewhat difficult Not difficult at all    Assessment &  Plan:   1. Acute post-traumatic headache, not intractable I recommend Katherine Parker pick up the prescription for prochlorperazine 5 mg and use 2-3 times a day PRN for her headaches +/- nausea. I explained that this medicine sometimes relieves headaches and not just nausea.  2. Concussion without loss of consciousness, subsequent encounter Continue PT and followign with Sport's medicine.  3. Contusion of right hip, subsequent encounter Improved.  4. Vitiligo Ms. Joseph Art  has some segmental vitiligo. This is currently stable. Should she start to have newer or rapidly growing lesions, she should follow up for possible topical treatments.  5. Morbid obesity with BMI of 50.0-59.9, adult [facility]) Pending appointment with [facility].  6. Moderate recurrent major depression (HCC) On venlafaxine. Remains under stressors that impact mood.  Return in about 6 weeks (around 09/07/2021) for Reassessment.   Loyola Mast, MD

## 2021-07-27 NOTE — Patient Instructions (Signed)
Vitiligo Vitiligo is a long-term, or chronic, skin disease that causes loss of skin color in patches (depigmentation). In this condition, areas of the skin that lose pigment turn milky white. This condition affects all types of skin but is most apparent in people with darker skin. Some people may also lose color in their hair or eyes or inside their mouths. Vitiligo usually starts before age 58, but it can occur at any age. There are two main types of vitiligo: Nonsegmental or bilateral vitiligo. This is the more common type. It affects both sides of the body, usually on the face, hands, arms, knees, or feet. This type starts suddenly and may come and go over your lifetime. Segmental or unilateral vitiligo. This type happens on only one side of the body, often on the face, arm, or leg. Some people also lose hair color. This type may start at an early age, get worse for a while, and then stop. Vitiligo is not spread from person to person. It is not dangerous to your health but can cause embarrassment and emotional stress. Some people with this condition may have a higher risk for hearing or vision loss. What are the causes? The exact cause of vitiligo is unknown. It may be an autoimmune disease. This is a type of disease that makes your body's defense system (immune system) mistakenly attack normal cells instead of germs. If your immune system attacks color-producing cells (melanocytes), it may cause vitiligo. When these cells die, you lose color. Sometimes, vitiligo is passed down through families, so it may also be a genetic condition. What increases the risk? The following factors may make you more likely to develop this condition: Having a family history of the condition. Having another autoimmune disease, such as rheumatoid arthritis, autoimmune thyroid disease, or type 1 diabetes. What are the signs or symptoms?  The only sign of this condition is a loss of color in your skin, hair, or eye, or  inside of your mouth. How is this diagnosed? This condition may be diagnosed by patches of depigmentation on your skin. You may also have blood tests to check for other types of autoimmune disease. How is this treated? There is no cure for this condition, and it is not dangerous to your health. However, you may choose to treat vitiligo if having it makes you feel uncomfortable or stressed about your appearance. Treatment will not cure your condition, but it may restore some skin color or prevent your condition from getting worse. Treatment options include: Makeup or self-tanning lotions. These can add color in depigmented areas. Hair dye may be used for white patches of hair. Skin creams, such as steroid creams, vitamin D creams, and creams that block the immune system. Ultraviolet light treatment. Medicines that cause depigmentation. These may be used to lighten darker skin so that white patches will be less visible. Surgery to remove pigmented skin from one area and place it in depigmented areas (skin grafting) or to add color with surgical tattooing. Follow these instructions at home: Take over-the-counter and prescription medicines only as told by your health care provider. Protect your skin from the sun. Sunburn can make vitiligo worse. Use sunscreen with an SPF of 30 or higher. Cover areas of affected skin with clothing or wear a hat when you are out in the sun. Manage stress. Stress can make this condition worse. Seek counseling and support if having vitiligo makes you feel anxious or depressed. Keep all follow-up visits. This is important. Contact a   health care provider if: Your condition is getting worse. You are struggling with depression or anxiety. You notice any changes in hearing or vision. Get help right away if: Your condition is quickly getting worse. If you ever feel like you may hurt yourself or others, or have thoughts about taking your own life, get help right away. Go to  your nearest emergency department or: Call your local emergency services (911 in the [place] Call a suicide crisis helpline, such as the National Suicide Prevention Lifeline at 1-800-273-8255 or 988 in the [place] This is open 24 hours a day in the [place] Text the Crisis Text Line at 741741 (in the [place] Summary Vitiligo is a long-term (chronic) skin disease that causes loss of skin color in patches. Some people with this condition may also lose color in their hair or eyes or inside their mouths. This condition is diagnosed by the signs of depigmentation. Blood testing may be done to look for other types of autoimmune disease. There is no cure for this condition, and it is not dangerous to your health. If your condition makes you feel uncomfortable or stressed about your appearance, you may consider treatment, such as skin creams or medicines. This information is not intended to replace advice given to you by your health care provider. Make sure you discuss any questions you have with your health care provider. Document Revised: 07/15/2020 Document Reviewed: 06/10/2019 Elsevier Patient Education  2023 [employer]  

## 2021-08-02 ENCOUNTER — Ambulatory Visit: Payer: Medicare Other

## 2021-08-02 DIAGNOSIS — R2681 Unsteadiness on feet: Secondary | ICD-10-CM | POA: Diagnosis not present

## 2021-08-02 DIAGNOSIS — R29818 Other symptoms and signs involving the nervous system: Secondary | ICD-10-CM

## 2021-08-02 NOTE — Therapy (Signed)
OUTPATIENT PHYSICAL THERAPY VESTIBULAR TREATMENT     Patient Name: Katherine Parker MRN: [id number] DOB:1965/04/23, 56 y.o., female Today's Date: 08/02/2021  PCP: [name] REFERRING PROVIDER: Lenda Kelp, MD    PT End of Session - 08/02/21 0805     Visit Number 2    Number of Visits 12    Date for PT Re-Evaluation 10/18/21    Authorization Type [employer] Medicare    PT Start Time 0805    PT Stop Time 0845    PT Time Calculation (min) 40 min    Activity Tolerance Patient tolerated treatment well    Behavior During Therapy [facility] for tasks assessed/performed             Past Medical History:  Diagnosis Date   Anxiety    CHF (congestive heart failure) (HCC)    Hypertension    SVT (supraventricular tachycardia) (HCC)    Past Surgical History:  Procedure Laterality Date   CESAREAN SECTION     GUM SURGERY     TUBAL LIGATION     Patient Active Problem List   Diagnosis Date Noted   Vitiligo 07/27/2021   Obstructive sleep apnea 03/24/2021   Stutter 03/24/2021   Paroxysmal SVT (supraventricular tachycardia) (HCC) 11/18/2020   Normocytic anemia 10/20/2020   Spondylolisthesis at L5-S1 level 10/20/2020   Prediabetes 10/20/2020   Hypertrophic cardiomyopathy (HCC) 10/13/2020   Morbid obesity with BMI of 50.0-59.9, adult (HCC) 10/13/2020   Essential hypertension 10/13/2020   Chronic right-sided low back pain with right-sided sciatica 10/13/2020   Borderline hyperlipidemia 03/09/2020   Insomnia 03/09/2020   Mild intermittent asthma 03/09/2020   Moderate recurrent major depression (HCC) 03/09/2020    ONSET DATE: 06/28/21  REFERRING DIAG: W09.8J1B (ICD-10-CM) - Concussion without loss of consciousness, subsequent encounter   THERAPY DIAG:  Unsteadiness on feet  Other symptoms and signs involving the nervous system  Rationale for Evaluation and Treatment Rehabilitation  SUBJECTIVE:   SUBJECTIVE STATEMENT: The movements were difficult over the weekend due to feeling  nauseous and neck pain which increases the discomfort Pt accompanied by: self  PERTINENT HISTORY:  6/28:  Slipped and fell 2 days ago at [facility] [place] and struck her head on the floor, no loss of consciousness.  Reported to the ED after the event and had head CT, cervical CT, thoracic and lumbar spine CT which were all unremarkable for acute abnormalities or fractures.  Lumbar spine did demonstrate anterolisthesis, likely chronic and not acute.  She was diagnosed with a concussion and sent home with narcotics, muscle relaxer, and naproxen to help with muscle aches and headaches.  Today she reports that she continues with headaches averaging about 4 times per day and positive for phonophobia and photophobia with intermittent nausea.  Resting in a dark room with minimal to no sounds helps her headaches.  Muscle aches have worsened since her fall 2 days ago.  Mental and physical activity worsen her symptoms.  No episodes of emesis but does report continued intermittent nausea.  No prior history of concussion.  No focal neurological deficits, acute vision changes, syncope or presyncope, or recent repeat falls since the initial event.   PAIN:  Are you having pain? Yes: NPRS scale: 6/10 Pain location: right lateral neck and left lateral head Pain description: tension in neck, "private drummer in my head", pulsing pain Aggravating factors: light, sound, movement Relieving factors: sitting quietly in the dark  PRECAUTIONS: None  WEIGHT BEARING RESTRICTIONS No  FALLS: Has patient fallen in last 6  months? Yes. Number of falls 1  LIVING ENVIRONMENT: Lives with: lives with their daughter Lives in: House/apartment Stairs: Yes: Internal: 12 steps; on right going up. 4 steps to enter BHR Has following equipment at home: None  PLOF: Independent  PATIENT GOALS be able to read, get rid of HA, return to prior activities like the gym  OBJECTIVE:   TODAY'S TREATMENT: 08/02/21 Activity Comments  MHP to  neck and pt education Education regarding sequence for vestibular rehabilitation and pos-concussion syndrome and discussion of timeline, rationale, sequence, and progression  Neck AROM 1x10 all planes   VOR x 1 2x30 sec, slow and guarded  Corner balance 2x15 sec EO+feet together, EC+feet together; semi-tandem EO           DIAGNOSTIC FINDINGS: imaging studies negative  COGNITION: Overall cognitive status: Within functional limits for tasks assessed   SENSATION: WFL    POSTURE: anterior pelvic tilt and flexed trunk    Cervical ROM:    Active A/PROM (deg) eval  Flexion WFL  Extension WFL  Right lateral flexion 25% limited  Left lateral flexion 25% limited  Right rotation: 25% limited 25%% limited    Left rotation: 25% limited   (Blank rows = not tested)  STRENGTH:   LOWER EXTREMITY MMT:   (Blank rows = not tested)    GAIT: Gait pattern: WFL Distance walked:  Assistive device utilized: None Level of assistance: Complete Independence Comments:   FUNCTIONAL TESTs:  DGI: 22/24 M-CTSIB: mild sway condition 2, mild-mod sway condition 4  PATIENT SURVEYS:  FOTO 54.19   VESTIBULAR ASSESSMENT   GENERAL OBSERVATION: bilateral lid ptosis?    SYMPTOM BEHAVIOR:   Subjective history: 6/26 fall with hitting head on ground, no LOC   Non-Vestibular symptoms: changes in vision, neck pain, headaches, and nausea/vomiting   Type of dizziness: Blurred Vision and Lightheadedness/Faint   Frequency: daily   Duration: seconds   Aggravating factors: Induced by motion: occur when walking and turning head quickly and Worse in the morning   Relieving factors: head stationary, dark room, closing eyes, and rest   Progression of symptoms: better   OCULOMOTOR EXAM:   Ocular Alignment: normal   Ocular ROM: No Limitations   Spontaneous Nystagmus: absent   Gaze-Induced Nystagmus: absent   Smooth Pursuits: intact   Saccades: intact   Convergence/Divergence: 22 cm  Reports feeling  frontal headache with these movements      VESTIBULAR - OCULAR REFLEX:    Slow VOR: Normal and Comment: slow movement with report of increased symptoms   VOR Cancellation: Normal, reports increased discomfort   Head-Impulse Test: HIT Right: positive HIT Left: negative --Reports feeling increased symptoms   Dynamic Visual Acuity: Not able to be assessed    POSITIONAL TESTING: Denies any positional provocation    MOTION SENSITIVITY: reports 7/10 discomfort with movements    Motion Sensitivity Quotient  Intensity: 0 = none, 1 = Lightheaded, 2 = Mild, 3 = Moderate, 4 = Severe, 5 = Vomiting  Intensity  1. Sitting to supine   2. Supine to L side   3. Supine to R side   4. Supine to sitting   5. L Hallpike-Dix   6. Up from L    7. R Hallpike-Dix   8. Up from R    9. Sitting, head  tipped to L knee   10. Head up from L  knee   11. Sitting, head  tipped to R knee   12. Head up from R  knee  13. Sitting head turns x5   14.Sitting head nods x5   15. In stance, 180  turn to L    16. In stance, 180  turn to R     OTHOSTATICS: not done  FUNCTIONAL GAIT:    VESTIBULAR TREATMENT/HEP:  Gaze Adaptation:   x1 Viewing Horizontal: Position: sitting, Time: 15-30 sec, and Reps: 3-5 and x1 Viewing Vertical:  Position: sitting and Time: 15-30 sec Habituation:   Other: to be initiated as indicated Access Code: [email or web address] - Seated Cervical Extension AROM  - 1-3 x daily - 7 x weekly - 1 sets - 10 reps - Seated Cervical Sidebending AROM  - 1-3 x daily - 7 x weekly - 1 sets - 10 reps - Seated Cervical Rotation AROM  - 1-3 x daily - 7 x weekly - 1 sets - 10 reps -Corner balance: eyes closed, EO semi-tandem, etc  PATIENT EDUCATION: Education details: vestibular rehabilitation Person educated: Patient Education method: Explanation Education comprehension: verbalized understanding   GOALS: Goals reviewed with patient? Yes  SHORT TERM GOALS: Target date: 09/06/2021   The patient  will be independent with HEP for gaze adaptation, habituation, balance, and general mobility. Baseline: Goal status: INITIAL   2. Demonstrate full cervical spine ROM to improve comfort and safety with driving  Baseline: 78% limitation with rotation and lateral flexion due to tightness/pain  Goal status: INITIAL    LONG TERM GOALS: Target date: 10/18/2021    Patient will manifest improved symptoms and function as indicated by score of 61 FOTO assessment Baseline: 54 Goal status: INITIAL  2.  Pt to report improved symptoms as evidenced by ability to perform reading activity without limitation/discomfort Baseline: unable, demo convergence tolerance of 22 cm Goal status: INITIAL  3.  Manifest improved symptoms per report of ability to return to gym routine 3 days/week Baseline: unable to perform at this time Goal status: INITIAL   ASSESSMENT:  CLINICAL IMPRESSION: Tx initiated with c-spine AROM to address limited/guarded ROM.  Initiated with warm-up of AROM for all movements of c-spine to add to HEP and proceeded with seated VOR x 1 which required very slow, deliberate pace with cues of not pushing past 4/10 discomfort in symptoms.  Pt notes onset of headache with VOR activity and limited this to prevent. Proceeded with balance activities with emphasis on performing with eyes closed for vestibular stimulation and demo excessive sway with these conditions and recommend perform at home in corner with chair in front for safety. Continued session to progress vestibular rehab   OBJECTIVE IMPAIRMENTS decreased activity tolerance, decreased balance, decreased mobility, decreased ROM, dizziness, impaired perceived functional ability, and pain.   ACTIVITY LIMITATIONS bending, locomotion level, and caring for others  PARTICIPATION LIMITATIONS: cleaning, driving, shopping, and community activity  PERSONAL FACTORS Time since onset of injury/illness/exacerbation and 1 comorbidity: CHF  are also  affecting patient's functional outcome.   REHAB POTENTIAL: Good  CLINICAL DECISION MAKING: Evolving/moderate complexity  EVALUATION COMPLEXITY: Moderate   PLAN: PT FREQUENCY: 1-2x/week  PT DURATION: 12 weeks  PLANNED INTERVENTIONS: Therapeutic exercises, Therapeutic activity, Neuromuscular re-education, Balance training, Gait training, Patient/Family education, Self Care, Joint mobilization, Stair training, Vestibular training, Canalith repositioning, DME instructions, Dry Needling, and Manual therapy  PLAN FOR NEXT SESSION: neck stretching/posture. Continue with VOR   Dion Body, PT 08/02/2021, 8:06 AM

## 2021-08-04 ENCOUNTER — Ambulatory Visit: Payer: Medicare Other | Admitting: Physical Therapy

## 2021-08-09 ENCOUNTER — Ambulatory Visit (INDEPENDENT_AMBULATORY_CARE_PROVIDER_SITE_OTHER): Payer: Medicare Other | Admitting: Family Medicine

## 2021-08-09 NOTE — Therapy (Signed)
OUTPATIENT PHYSICAL THERAPY VESTIBULAR TREATMENT     Patient Name: Katherine Parker MRN: [id number] DOB:April 04, 1965, 56 y.o., female Today's Date: 08/10/2021  PCP: [name] REFERRING PROVIDER: Lenda Kelp, MD    PT End of Session - 08/10/21 [phone]     Visit Number 3    Number of Visits 12    Date for PT Re-Evaluation 10/18/21    Authorization Type [employer] Medicare    PT Start Time 0806    PT Stop Time 0844    PT Time Calculation (min) 38 min    Activity Tolerance Patient tolerated treatment well;Patient limited by pain    Behavior During Therapy [facility] for tasks assessed/performed              Past Medical History:  Diagnosis Date   Anxiety    CHF (congestive heart failure) (HCC)    Hypertension    SVT (supraventricular tachycardia) (HCC)    Past Surgical History:  Procedure Laterality Date   CESAREAN SECTION     GUM SURGERY     TUBAL LIGATION     Patient Active Problem List   Diagnosis Date Noted   Vitiligo 07/27/2021   Obstructive sleep apnea 03/24/2021   Stutter 03/24/2021   Paroxysmal SVT (supraventricular tachycardia) (HCC) 11/18/2020   Normocytic anemia 10/20/2020   Spondylolisthesis at L5-S1 level 10/20/2020   Prediabetes 10/20/2020   Hypertrophic cardiomyopathy (HCC) 10/13/2020   Morbid obesity with BMI of 50.0-59.9, adult (HCC) 10/13/2020   Essential hypertension 10/13/2020   Chronic right-sided low back pain with right-sided sciatica 10/13/2020   Borderline hyperlipidemia 03/09/2020   Insomnia 03/09/2020   Mild intermittent asthma 03/09/2020   Moderate recurrent major depression (HCC) 03/09/2020    ONSET DATE: 06/28/21  REFERRING DIAG: F63.8G6K (ICD-10-CM) - Concussion without loss of consciousness, subsequent encounter   THERAPY DIAG:  Unsteadiness on feet  Other symptoms and signs involving the nervous system  Rationale for Evaluation and Treatment Rehabilitation  SUBJECTIVE:   SUBJECTIVE STATEMENT: Doing a little better, back and  neck are a little sore today. Also have a HA and some nausea, which is why I am wearing sunglasses.  Pt accompanied by: self  PAIN:  Are you having pain? Yes: NPRS scale: 7/10 Pain location: posterior neck and LB Pain description: sharp Aggravating factors: bending Relieving factors: propping pillows behind back  PRECAUTIONS: None  PERTINENT HISTORY:  6/28:  Slipped and fell 2 days ago at [facility] and struck her head on the floor, no loss of consciousness.  Reported to the ED after the event and had head CT, cervical CT, thoracic and lumbar spine CT which were all unremarkable for acute abnormalities or fractures.  Lumbar spine did demonstrate anterolisthesis, likely chronic and not acute.  She was diagnosed with a concussion and sent home with narcotics, muscle relaxer, and naproxen to help with muscle aches and headaches.  Today she reports that she continues with headaches averaging about 4 times per day and positive for phonophobia and photophobia with intermittent nausea.  Resting in a dark room with minimal to no sounds helps her headaches.  Muscle aches have worsened since her fall 2 days ago.  Mental and physical activity worsen her symptoms.  No episodes of emesis but does report continued intermittent nausea.  No prior history of concussion.  No focal neurological deficits, acute vision changes, syncope or presyncope, or recent repeat falls since the initial event.  PATIENT GOALS be able to read, get rid of HA, return to prior activities like  the gym  OBJECTIVE:    TODAY'S TREATMENT: 08/10/21 Activity Comments  Shoulder circles fwd/back 10x each   Shoulder shrugs 10x    Palpation of neck  Soft tissue restriction in L UT; otherwise very sensitive to light palpation throughout and unable to fully palpate  Scap retraction 10x3" Good form  Cervical retraction 2x5 Good form/alignment; pain  Convergence  C/o Diplopia ~3 feet away  Walking convergence to target on wall 5x  C/o  mild dizziness      HOME EXERCISE PROGRAM Last updated: 08/10/21 Access code: [email or web address]   PATIENT EDUCATION: Education details: educated on avoiding reliance on sunglasses for photophobia, edu on decreasing sensitivity to touch by using desensitization techniques and preventing fear avoidance behaviors, edu on cervicogenic Has, importance of continued movement throughout the day to address stiffness and pain; HEP update Person educated: Patient Education method: Customer service manager, handout  Education comprehension: verbalized understanding and returned demonstration   Below measures were taken at time of initial evaluation unless otherwise specified:   DIAGNOSTIC FINDINGS: imaging studies negative  COGNITION: Overall cognitive status: Within functional limits for tasks assessed   SENSATION: WFL    POSTURE: anterior pelvic tilt and flexed trunk    Cervical ROM:    Active A/PROM (deg) eval  Flexion WFL  Extension WFL  Right lateral flexion 25% limited  Left lateral flexion 25% limited  Right rotation: 25% limited 25%% limited    Left rotation: 25% limited   (Blank rows = not tested)  STRENGTH:   LOWER EXTREMITY MMT:   (Blank rows = not tested)    GAIT: Gait pattern: WFL Distance walked:  Assistive device utilized: None Level of assistance: Complete Independence Comments:   FUNCTIONAL TESTs:  DGI: 22/24 M-CTSIB: mild sway condition 2, mild-mod sway condition 4  PATIENT SURVEYS:  FOTO 54.19   VESTIBULAR ASSESSMENT   GENERAL OBSERVATION: bilateral lid ptosis?    SYMPTOM BEHAVIOR:   Subjective history: 6/26 fall with hitting head on ground, no LOC   Non-Vestibular symptoms: changes in vision, neck pain, headaches, and nausea/vomiting   Type of dizziness: Blurred Vision and Lightheadedness/Faint   Frequency: daily   Duration: seconds   Aggravating factors: Induced by motion: occur when walking and turning head quickly and Worse in the  morning   Relieving factors: head stationary, dark room, closing eyes, and rest   Progression of symptoms: better   OCULOMOTOR EXAM:   Ocular Alignment: normal   Ocular ROM: No Limitations   Spontaneous Nystagmus: absent   Gaze-Induced Nystagmus: absent   Smooth Pursuits: intact   Saccades: intact   Convergence/Divergence: 22 cm  Reports feeling frontal headache with these movements      VESTIBULAR - OCULAR REFLEX:    Slow VOR: Normal and Comment: slow movement with report of increased symptoms   VOR Cancellation: Normal, reports increased discomfort   Head-Impulse Test: HIT Right: positive HIT Left: negative --Reports feeling increased symptoms   Dynamic Visual Acuity: Not able to be assessed    POSITIONAL TESTING: Denies any positional provocation    MOTION SENSITIVITY: reports 7/10 discomfort with movements    Motion Sensitivity Quotient  Intensity: 0 = none, 1 = Lightheaded, 2 = Mild, 3 = Moderate, 4 = Severe, 5 = Vomiting  Intensity  1. Sitting to supine   2. Supine to L side   3. Supine to R side   4. Supine to sitting   5. L Hallpike-Dix   6. Up from L    7. R  Hallpike-Dix   8. Up from R    9. Sitting, head  tipped to L knee   10. Head up from L  knee   11. Sitting, head  tipped to R knee   12. Head up from R  knee   13. Sitting head turns x5   14.Sitting head nods x5   15. In stance, 180  turn to L    16. In stance, 180  turn to R     OTHOSTATICS: not done  FUNCTIONAL GAIT:    VESTIBULAR TREATMENT/HEP:  Gaze Adaptation:   x1 Viewing Horizontal: Position: sitting, Time: 15-30 sec, and Reps: 3-5 and x1 Viewing Vertical:  Position: sitting and Time: 15-30 sec Habituation:   Other: to be initiated as indicated Access Code: [email or web address] - Seated Cervical Extension AROM  - 1-3 x daily - 7 x weekly - 1 sets - 10 reps - Seated Cervical Sidebending AROM  - 1-3 x daily - 7 x weekly - 1 sets - 10 reps - Seated Cervical Rotation AROM  - 1-3 x daily - 7  x weekly - 1 sets - 10 reps -Corner balance: eyes closed, EO semi-tandem, etc  PATIENT EDUCATION: Education details: vestibular rehabilitation Person educated: Patient Education method: Explanation Education comprehension: verbalized understanding   GOALS: Goals reviewed with patient? Yes  SHORT TERM GOALS: Target date: 09/06/2021   The patient will be independent with HEP for gaze adaptation, habituation, balance, and general mobility. Baseline: Goal status: IN PROGRESS   2. Demonstrate full cervical spine ROM to improve comfort and safety with driving  Baseline: 21% limitation with rotation and lateral flexion due to tightness/pain  Goal status: IN PROGRESS    LONG TERM GOALS: Target date: 10/18/2021    Patient will manifest improved symptoms and function as indicated by score of 61 FOTO assessment Baseline: 54 Goal status: IN PROGRESS  2.  Pt to report improved symptoms as evidenced by ability to perform reading activity without limitation/discomfort Baseline: unable, demo convergence tolerance of 22 cm Goal status: IN PROGRESS  3.  Manifest improved symptoms per report of ability to return to gym routine 3 days/week Baseline: unable to perform at this time Goal status: IN PROGRESS   ASSESSMENT:  CLINICAL IMPRESSION: Patient arrived to session with report of HA, nausea, and neck/LB pain this AM. Wearing sunglasses to session d/t photophobia. Worked on gentle cervical and shoulder mobility within tolerance. Upon palpation, patient with soft tissue restriction in L UE but otherwise very TTP with light touch thus unable to fully assess. Patient was educated on desensitization technique and preventing ear avoidance behaviors. Also worked on convergence as this may also be contributing to Has. Patient reported understanding of HEP update and without complaints upon leaving.    OBJECTIVE IMPAIRMENTS decreased activity tolerance, decreased balance, decreased mobility,  decreased ROM, dizziness, impaired perceived functional ability, and pain.   ACTIVITY LIMITATIONS bending, locomotion level, and caring for others  PARTICIPATION LIMITATIONS: cleaning, driving, shopping, and community activity  PERSONAL FACTORS Time since onset of injury/illness/exacerbation and 1 comorbidity: CHF  are also affecting patient's functional outcome.   REHAB POTENTIAL: Good  CLINICAL DECISION MAKING: Evolving/moderate complexity  EVALUATION COMPLEXITY: Moderate   PLAN: PT FREQUENCY: 1-2x/week  PT DURATION: 12 weeks  PLANNED INTERVENTIONS: Therapeutic exercises, Therapeutic activity, Neuromuscular re-education, Balance training, Gait training, Patient/Family education, Self Care, Joint mobilization, Stair training, Vestibular training, Canalith repositioning, DME instructions, Dry Needling, and Manual therapy  PLAN FOR NEXT SESSION: neck stretching/posture. Continue with VOR  Janene Harvey, PT, DPT 08/10/21 9:38 AM  [facility] [address] [place], [place], [place] [address] Phone # [phone] Fax # [phone]

## 2021-08-10 ENCOUNTER — Ambulatory Visit: Payer: Medicare Other | Attending: Family Medicine | Admitting: Physical Therapy

## 2021-08-10 ENCOUNTER — Encounter: Payer: Self-pay | Admitting: Physical Therapy

## 2021-08-10 DIAGNOSIS — R29818 Other symptoms and signs involving the nervous system: Secondary | ICD-10-CM | POA: Diagnosis present

## 2021-08-10 DIAGNOSIS — R2681 Unsteadiness on feet: Secondary | ICD-10-CM | POA: Insufficient documentation

## 2021-08-11 NOTE — Therapy (Signed)
OUTPATIENT PHYSICAL THERAPY VESTIBULAR TREATMENT     Patient Name: Katherine Parker MRN: [id number] DOB:Sep 22, 1965, 56 y.o., female Today's Date: 08/11/2021  PCP: [name] REFERRING PROVIDER: Lenda Kelp, MD       Past Medical History:  Diagnosis Date   Anxiety    CHF (congestive heart failure) (HCC)    Hypertension    SVT (supraventricular tachycardia) [facility])    Past Surgical History:  Procedure Laterality Date   CESAREAN SECTION     GUM SURGERY     TUBAL LIGATION     Patient Active Problem List   Diagnosis Date Noted   Vitiligo 07/27/2021   Obstructive sleep apnea 03/24/2021   Stutter 03/24/2021   Paroxysmal SVT (supraventricular tachycardia) (HCC) 11/18/2020   Normocytic anemia 10/20/2020   Spondylolisthesis at L5-S1 level 10/20/2020   Prediabetes 10/20/2020   Hypertrophic cardiomyopathy (HCC) 10/13/2020   Morbid obesity with BMI of 50.0-59.9, adult (HCC) 10/13/2020   Essential hypertension 10/13/2020   Chronic right-sided low back pain with right-sided sciatica 10/13/2020   Borderline hyperlipidemia 03/09/2020   Insomnia 03/09/2020   Mild intermittent asthma 03/09/2020   Moderate recurrent major depression (HCC) 03/09/2020    ONSET DATE: 06/28/21  REFERRING DIAG: W46.6Z9D (ICD-10-CM) - Concussion without loss of consciousness, subsequent encounter   THERAPY DIAG:  No diagnosis found.  Rationale for Evaluation and Treatment Rehabilitation  SUBJECTIVE:   SUBJECTIVE STATEMENT: Doing a little better, back and neck are a little sore today. Also have a HA and some nausea, which is why I am wearing sunglasses.  Pt accompanied by: self  PAIN:  Are you having pain? Yes: NPRS scale: 7/10 Pain location: posterior neck and LB Pain description: sharp Aggravating factors: bending Relieving factors: propping pillows behind back  PRECAUTIONS: None  PERTINENT HISTORY:  6/28:  Slipped and fell 2 days ago at [facility] and struck her head on the floor,  no loss of consciousness.  Reported to the ED after the event and had head CT, cervical CT, thoracic and lumbar spine CT which were all unremarkable for acute abnormalities or fractures.  Lumbar spine did demonstrate anterolisthesis, likely chronic and not acute.  She was diagnosed with a concussion and sent home with narcotics, muscle relaxer, and naproxen to help with muscle aches and headaches.  Today she reports that she continues with headaches averaging about 4 times per day and positive for phonophobia and photophobia with intermittent nausea.  Resting in a dark room with minimal to no sounds helps her headaches.  Muscle aches have worsened since her fall 2 days ago.  Mental and physical activity worsen her symptoms.  No episodes of emesis but does report continued intermittent nausea.  No prior history of concussion.  No focal neurological deficits, acute vision changes, syncope or presyncope, or recent repeat falls since the initial event.  PATIENT GOALS be able to read, get rid of HA, return to prior activities like the gym  OBJECTIVE:     TODAY'S TREATMENT: 08/12/21 Activity Comments                       TODAY'S TREATMENT: 08/10/21 Activity Comments  Shoulder circles fwd/back 10x each   Shoulder shrugs 10x    Palpation of neck  Soft tissue restriction in L UT; otherwise very sensitive to light palpation throughout and unable to fully palpate  Scap retraction 10x3" Good form  Cervical retraction 2x5 Good form/alignment; pain  Convergence  C/o Diplopia ~3 feet away  Walking convergence  to target on wall 5x  C/o mild dizziness      HOME EXERCISE PROGRAM Last updated: 08/10/21 Access code: [email or web address]   PATIENT EDUCATION: Education details: educated on avoiding reliance on sunglasses for photophobia, edu on decreasing sensitivity to touch by using desensitization techniques and preventing fear avoidance behaviors, edu on cervicogenic Has, importance of continued movement  throughout the day to address stiffness and pain; HEP update Person educated: Patient Education method: Medical illustrator, handout  Education comprehension: verbalized understanding and returned demonstration   Below measures were taken at time of initial evaluation unless otherwise specified:   DIAGNOSTIC FINDINGS: imaging studies negative  COGNITION: Overall cognitive status: Within functional limits for tasks assessed   SENSATION: WFL    POSTURE: anterior pelvic tilt and flexed trunk    Cervical ROM:    Active A/PROM (deg) eval  Flexion WFL  Extension WFL  Right lateral flexion 25% limited  Left lateral flexion 25% limited  Right rotation: 25% limited 25%% limited    Left rotation: 25% limited   (Blank rows = not tested)  STRENGTH:   LOWER EXTREMITY MMT:   (Blank rows = not tested)    GAIT: Gait pattern: WFL Distance walked:  Assistive device utilized: None Level of assistance: Complete Independence Comments:   FUNCTIONAL TESTs:  DGI: 22/24 M-CTSIB: mild sway condition 2, mild-mod sway condition 4  PATIENT SURVEYS:  FOTO 54.19   VESTIBULAR ASSESSMENT   GENERAL OBSERVATION: bilateral lid ptosis?    SYMPTOM BEHAVIOR:   Subjective history: 6/26 fall with hitting head on ground, no LOC   Non-Vestibular symptoms: changes in vision, neck pain, headaches, and nausea/vomiting   Type of dizziness: Blurred Vision and Lightheadedness/Faint   Frequency: daily   Duration: seconds   Aggravating factors: Induced by motion: occur when walking and turning head quickly and Worse in the morning   Relieving factors: head stationary, dark room, closing eyes, and rest   Progression of symptoms: better   OCULOMOTOR EXAM:   Ocular Alignment: normal   Ocular ROM: No Limitations   Spontaneous Nystagmus: absent   Gaze-Induced Nystagmus: absent   Smooth Pursuits: intact   Saccades: intact   Convergence/Divergence: 22 cm  Reports feeling frontal  headache with these movements      VESTIBULAR - OCULAR REFLEX:    Slow VOR: Normal and Comment: slow movement with report of increased symptoms   VOR Cancellation: Normal, reports increased discomfort   Head-Impulse Test: HIT Right: positive HIT Left: negative --Reports feeling increased symptoms   Dynamic Visual Acuity: Not able to be assessed    POSITIONAL TESTING: Denies any positional provocation    MOTION SENSITIVITY: reports 7/10 discomfort with movements    Motion Sensitivity Quotient  Intensity: 0 = none, 1 = Lightheaded, 2 = Mild, 3 = Moderate, 4 = Severe, 5 = Vomiting  Intensity  1. Sitting to supine   2. Supine to L side   3. Supine to R side   4. Supine to sitting   5. L Hallpike-Dix   6. Up from L    7. R Hallpike-Dix   8. Up from R    9. Sitting, head  tipped to L knee   10. Head up from L  knee   11. Sitting, head  tipped to R knee   12. Head up from R  knee   13. Sitting head turns x5   14.Sitting head nods x5   15. In stance, 180  turn to L  16. In stance, 180  turn to R     OTHOSTATICS: not done  FUNCTIONAL GAIT:    VESTIBULAR TREATMENT/HEP:  Gaze Adaptation:   x1 Viewing Horizontal: Position: sitting, Time: 15-30 sec, and Reps: 3-5 and x1 Viewing Vertical:  Position: sitting and Time: 15-30 sec Habituation:   Other: to be initiated as indicated Access Code: [email or web address] - Seated Cervical Extension AROM  - 1-3 x daily - 7 x weekly - 1 sets - 10 reps - Seated Cervical Sidebending AROM  - 1-3 x daily - 7 x weekly - 1 sets - 10 reps - Seated Cervical Rotation AROM  - 1-3 x daily - 7 x weekly - 1 sets - 10 reps -Corner balance: eyes closed, EO semi-tandem, etc  PATIENT EDUCATION: Education details: vestibular rehabilitation Person educated: Patient Education method: Explanation Education comprehension: verbalized understanding   GOALS: Goals reviewed with patient? Yes  SHORT TERM GOALS: Target date: 09/06/2021   The patient will be  independent with HEP for gaze adaptation, habituation, balance, and general mobility. Baseline: Goal status: IN PROGRESS   2. Demonstrate full cervical spine ROM to improve comfort and safety with driving  Baseline: 29% limitation with rotation and lateral flexion due to tightness/pain  Goal status: IN PROGRESS    LONG TERM GOALS: Target date: 10/18/2021    Patient will manifest improved symptoms and function as indicated by score of 61 FOTO assessment Baseline: 54 Goal status: IN PROGRESS  2.  Pt to report improved symptoms as evidenced by ability to perform reading activity without limitation/discomfort Baseline: unable, demo convergence tolerance of 22 cm Goal status: IN PROGRESS  3.  Manifest improved symptoms per report of ability to return to gym routine 3 days/week Baseline: unable to perform at this time Goal status: IN PROGRESS   ASSESSMENT:  CLINICAL IMPRESSION: Patient arrived to session with report of HA, nausea, and neck/LB pain this AM. Wearing sunglasses to session d/t photophobia. Worked on gentle cervical and shoulder mobility within tolerance. Upon palpation, patient with soft tissue restriction in L UE but otherwise very TTP with light touch thus unable to fully assess. Patient was educated on desensitization technique and preventing ear avoidance behaviors. Also worked on convergence as this may also be contributing to Has. Patient reported understanding of HEP update and without complaints upon leaving.    OBJECTIVE IMPAIRMENTS decreased activity tolerance, decreased balance, decreased mobility, decreased ROM, dizziness, impaired perceived functional ability, and pain.   ACTIVITY LIMITATIONS bending, locomotion level, and caring for others  PARTICIPATION LIMITATIONS: cleaning, driving, shopping, and community activity  PERSONAL FACTORS Time since onset of injury/illness/exacerbation and 1 comorbidity: CHF  are also affecting patient's functional outcome.    REHAB POTENTIAL: Good  CLINICAL DECISION MAKING: Evolving/moderate complexity  EVALUATION COMPLEXITY: Moderate   PLAN: PT FREQUENCY: 1-2x/week  PT DURATION: 12 weeks  PLANNED INTERVENTIONS: Therapeutic exercises, Therapeutic activity, Neuromuscular re-education, Balance training, Gait training, Patient/Family education, Self Care, Joint mobilization, Stair training, Vestibular training, Canalith repositioning, DME instructions, Dry Needling, and Manual therapy  PLAN FOR NEXT SESSION: neck stretching/posture. Continue with VOR   Anette Guarneri, PT, DPT 08/11/21 10:00 AM  [facility] at [facility] [address], [address] [place], [place] [address] Phone # [phone] Fax # [phone]

## 2021-08-12 ENCOUNTER — Encounter: Payer: Self-pay | Admitting: Physical Therapy

## 2021-08-12 ENCOUNTER — Ambulatory Visit: Payer: Medicare Other | Admitting: Physical Therapy

## 2021-08-12 DIAGNOSIS — R2681 Unsteadiness on feet: Secondary | ICD-10-CM | POA: Diagnosis not present

## 2021-08-12 DIAGNOSIS — R29818 Other symptoms and signs involving the nervous system: Secondary | ICD-10-CM

## 2021-08-13 ENCOUNTER — Ambulatory Visit: Payer: Medicare Other | Admitting: Podiatrist

## 2021-08-13 ENCOUNTER — Encounter: Payer: Self-pay | Admitting: Podiatrist

## 2021-08-13 DIAGNOSIS — L84 Corns and callosities: Secondary | ICD-10-CM

## 2021-08-13 DIAGNOSIS — B351 Tinea unguium: Secondary | ICD-10-CM | POA: Diagnosis not present

## 2021-08-13 DIAGNOSIS — M79675 Pain in left toe(s): Secondary | ICD-10-CM

## 2021-08-13 DIAGNOSIS — M79674 Pain in right toe(s): Secondary | ICD-10-CM | POA: Diagnosis not present

## 2021-08-13 MED ORDER — TERBINAFINE HCL 250 MG PO TABS
250.0000 mg | ORAL_TABLET | Freq: Every day | ORAL | 0 refills | Status: DC
Start: 2021-08-13 — End: 2021-11-29

## 2021-08-13 NOTE — Progress Notes (Signed)
Subjective: Katherine Parker is a 56 y.o. female patient who presents to office today complaining of long,mildly painful nails  while ambulating in shoes; unable to trim.  She has taken the Lamisil and states she is noted significant improvement in appearance of the nails.  They are grown out about a third of the way and she is happy with the progress.  She has a couple of corns which are also bothersome at today's visit.  Patient denies any new cramping, numbness, burning or tingling in the legs or feet.  Patient Active Problem List   Diagnosis Date Noted   Vitiligo 07/27/2021   Obstructive sleep apnea 03/24/2021   Stutter 03/24/2021   Paroxysmal SVT (supraventricular tachycardia) (HCC) 11/18/2020   Normocytic anemia 10/20/2020   Spondylolisthesis at L5-S1 level 10/20/2020   Prediabetes 10/20/2020   Hypertrophic cardiomyopathy (HCC) 10/13/2020   Morbid obesity with BMI of 50.0-59.9, adult (HCC) 10/13/2020   Essential hypertension 10/13/2020   Chronic right-sided low back pain with right-sided sciatica 10/13/2020   Borderline hyperlipidemia 03/09/2020   Insomnia 03/09/2020   Mild intermittent asthma 03/09/2020   Moderate recurrent major depression (HCC) 03/09/2020   Current Outpatient Medications on File Prior to Visit  Medication Sig Dispense Refill   acetaminophen-codeine (TYLENOL #3) 300-30 MG tablet Take 1 tablet by mouth every 4 (four) hours as needed for moderate pain. 10 tablet 0   albuterol (VENTOLIN HFA) 108 (90 Base) MCG/ACT inhaler Inhale 2 puffs into the lungs every 6 (six) hours as needed for wheezing or shortness of breath.     Melatonin 10 MG TABS Take 10 mg by mouth at bedtime as needed (sleep).     methocarbamol (ROBAXIN) 500 MG tablet Take 1 tablet (500 mg total) by mouth every 8 (eight) hours as needed for muscle spasms. 60 tablet 1   metoprolol succinate (TOPROL XL) 25 MG 24 hr tablet Take 1 tablet (25 mg total) by mouth daily. 90 tablet 3   naproxen (NAPROSYN) 375 MG  tablet Take 1 tablet (375 mg total) by mouth 2 (two) times daily with a meal. 60 tablet 1   ondansetron (ZOFRAN) 4 MG tablet Take 1 tablet (4 mg total) by mouth every 8 (eight) hours as needed for nausea or vomiting. 60 tablet 1   prochlorperazine (COMPAZINE) 5 MG tablet Take 1 tablet (5 mg total) by mouth every 6 (six) hours as needed for nausea or vomiting. 30 tablet 0   venlafaxine XR (EFFEXOR-XR) 150 MG 24 hr capsule Take 1 capsule (150 mg total) by mouth daily with breakfast. 90 capsule 3   No current facility-administered medications on file prior to visit.   Allergies  Allergen Reactions   Penicillin G     Other reaction(s): Unknown   Penicillins     Did it involve swelling of the face/tongue/throat, SOB, or low BP? Unknown Did it involve sudden or severe rash/hives, skin peeling, or any reaction on the inside of your mouth or nose? Unknown Did you need to seek medical attention at a hospital or doctor's office? Unknown When did it last happen?  Childhood    If all above answers are "NO", may proceed with cephalosporin use.       Objective: General: Patient is awake, alert, and oriented x 3 and in no acute distress.  Integument: Skin is warm, dry and supple bilateral. Nails are tender, long, thickened and  dystrophic with subungual debris, consistent with onychomycosis, 1-5 bilateral.  Clearing of the nails in the proximal one third of  the nail bed is noted upon clinical examination it does appear that Lamisil is working well for her nails.  No signs of infection.  Thick hyperkeratotic lesion is present fifth digits bilateral.  She also has a well-circumscribed porokeratotic lesion plantar lateral aspect of the left foot which is painful with palpation.    Vasculature:  Dorsalis Pedis pulse /4 bilateral. Posterior Tibial pulse  /4 bilateral.  Capillary fill time <3 sec 1-5 bilateral. Positive hair growth to the level of the digits. Temperature gradient within normal limits. No  varicosities present bilateral. No edema present bilateral.   Neurology: The patient has intact sensation measured with a 5.07/10g Semmes Weinstein Monofilament at all pedal sites bilateral . Vibratory sensation diminished bilateral with tuning fork. No Babinski sign present bilateral.   Musculoskeletal: No symptomatic pedal deformities noted bilateral.  Planus foot type is noted bilateral muscular strength 5/5 in all lower extremity muscular groups bilateral without pain on range of motion . No tenderness with calf compression bilateral.  Assessment and Plan:   ICD-10-CM   1. Pain due to onychomycosis of toenails of both feet  B35.1    M79.675    M79.674     2. Corn or callus  L84        -Examined patient. -Mechanically debrided all nails 1-5 bilateral using sterile nail nipper and filed with dremel without incident  -Discussed a second course of Lamisil she would like to take.  This was called in for her. -Debridement of calluses x3 was accomplished today with a 15 blade without complication. -Patient to return  in 3 months for continued foot care.  -Patient advised to call the office if any problems or questions arise in the meantime.  Delories Heinz, DPM

## 2021-08-16 ENCOUNTER — Encounter: Payer: Self-pay | Admitting: Family Medicine

## 2021-08-16 ENCOUNTER — Ambulatory Visit (INDEPENDENT_AMBULATORY_CARE_PROVIDER_SITE_OTHER): Payer: Medicare Other | Admitting: Family Medicine

## 2021-08-16 VITALS — BP 152/83 | Ht 66.5 in | Wt 340.0 lb

## 2021-08-16 DIAGNOSIS — S060X0D Concussion without loss of consciousness, subsequent encounter: Secondary | ICD-10-CM | POA: Diagnosis not present

## 2021-08-16 MED ORDER — ONDANSETRON HCL 4 MG PO TABS
4.0000 mg | ORAL_TABLET | Freq: Three times a day (TID) | ORAL | 1 refills | Status: DC | PRN
Start: 2021-08-16 — End: 2021-11-29

## 2021-08-16 NOTE — Progress Notes (Signed)
PCP: Loyola Mast, MD  Subjective:   HPI: Patient is a 56 y.o. female here for concussion.  6/28: Slipped and fell 2 days ago at [facility] and struck her head on the floor, no loss of consciousness.  Reported to the ED after the event and had head CT, cervical CT, thoracic and lumbar spine CT which were all unremarkable for acute abnormalities or fractures.  Lumbar spine did demonstrate anterolisthesis, likely chronic and not acute.  She was diagnosed with a concussion and sent home with narcotics, muscle relaxer, and naproxen to help with muscle aches and headaches.  Today she reports that she continues with headaches averaging about 4 times per day and positive for phonophobia and photophobia with intermittent nausea.  Resting in a dark room with minimal to no sounds helps her headaches.  Muscle aches have worsened since her fall 2 days ago.  Mental and physical activity worsen her symptoms.  No episodes of emesis but does report continued intermittent nausea.  No prior history of concussion.  No focal neurological deficits, acute vision changes, syncope or presyncope, or recent repeat falls since the initial event.   7/17: Patient reports she is improved compared to last visit. Frontal headache her main complaint - comes on without any specific trigger. Taking tylenol for this. Also with nausea and dizziness. SCAT symptoms 22/22 with severity 76/132, improved.  8/14: Reports ~20% improvement in symptoms from last visit Frontal and occipital headache remains, intermittently. Having nausea a couple times a week, would like a refill on Zofran Having memory issues as well Also has stress regarding mother now in palliative care SCAT symptoms 22/22 with severity 83/132, worsened.  Past Medical History:  Diagnosis Date   Anxiety    CHF (congestive heart failure) (HCC)    Hypertension    SVT (supraventricular tachycardia) (HCC)     Current Outpatient Medications on File Prior to  Visit  Medication Sig Dispense Refill   acetaminophen-codeine (TYLENOL #3) 300-30 MG tablet Take 1 tablet by mouth every 4 (four) hours as needed for moderate pain. 10 tablet 0   albuterol (VENTOLIN HFA) 108 (90 Base) MCG/ACT inhaler Inhale 2 puffs into the lungs every 6 (six) hours as needed for wheezing or shortness of breath.     Melatonin 10 MG TABS Take 10 mg by mouth at bedtime as needed (sleep).     methocarbamol (ROBAXIN) 500 MG tablet Take 1 tablet (500 mg total) by mouth every 8 (eight) hours as needed for muscle spasms. 60 tablet 1   metoprolol succinate (TOPROL XL) 25 MG 24 hr tablet Take 1 tablet (25 mg total) by mouth daily. 90 tablet 3   naproxen (NAPROSYN) 375 MG tablet Take 1 tablet (375 mg total) by mouth 2 (two) times daily with a meal. 60 tablet 1   ondansetron (ZOFRAN) 4 MG tablet Take 1 tablet (4 mg total) by mouth every 8 (eight) hours as needed for nausea or vomiting. 60 tablet 1   prochlorperazine (COMPAZINE) 5 MG tablet Take 1 tablet (5 mg total) by mouth every 6 (six) hours as needed for nausea or vomiting. 30 tablet 0   terbinafine (LAMISIL) 250 MG tablet Take 1 tablet (250 mg total) by mouth daily. 90 tablet 0   venlafaxine XR (EFFEXOR-XR) 150 MG 24 hr capsule Take 1 capsule (150 mg total) by mouth daily with breakfast. 90 capsule 3   No current facility-administered medications on file prior to visit.    Past Surgical History:  Procedure Laterality  Date   CESAREAN SECTION     GUM SURGERY     TUBAL LIGATION      Allergies  Allergen Reactions   Penicillin G     Other reaction(s): Unknown   Penicillins     Did it involve swelling of the face/tongue/throat, SOB, or low BP? Unknown Did it involve sudden or severe rash/hives, skin peeling, or any reaction on the inside of your mouth or nose? Unknown Did you need to seek medical attention at a hospital or doctor's office? Unknown When did it last happen?  Childhood    If all above answers are "NO", may proceed  with cephalosporin use.      BP (!) 152/83   Ht 5' 6.5" (1.689 m)   Wt (!) 340 lb (154.2 kg)   BMI 54.06 kg/m       No data to display              No data to display              Objective:  Physical Exam:  Gen: NAD, comfortable in exam room  Neuro: A&O x4. No focal neurologic deficits. Speech clear and fluent. Normal EOMI, pupils PERRLA. Finger to nose normal. Delayed recall 3/5, but improved from prior.   Assessment & Plan:  1. Post-concussive syndrome d/t fall 6/26 Improving clinically, although severity on SCAT does not reflect this Encouraged to continue vestibular rehab Provided Zofran PRN for nausea Discussed possibility of increasing toprol to help with headaches- pt will discuss with Dr. Veto Kemps for tolerance F/u in 1 month

## 2021-08-16 NOTE — Patient Instructions (Addendum)
Continue vestibular therapy. Take tylenol as needed as first line medication for headache. Ondansetron is for nausea and can be taken 3 times a day - consider taking about 30 minutes before you take the naproxen if this upsets your stomach. Take naproxen twice a day with food as needed. Ask Dr. Veto Kemps if you can go up on your toprol dose - this should help with the headaches. Methocarbamol as needed for muscle spasms/pain. Mental and physical rest are important. Light cardio (stationary bike, walking, light jogging) may be beneficial as long as it does not worsen your symptoms. Do not do any activities that put you at risk of getting struck in the head. Follow up with me in 1 month

## 2021-08-16 NOTE — Therapy (Signed)
OUTPATIENT PHYSICAL THERAPY VESTIBULAR TREATMENT     Patient Name: Katherine Parker MRN: [id number] DOB:Dec 12, 1965, 56 y.o., female Today's Date: 08/17/2021  PCP: [name] REFERRING PROVIDER: Lenda Kelp, MD    PT End of Session - 08/17/21 0850     Visit Number 5    Number of Visits 12    Date for PT Re-Evaluation 10/18/21    Authorization Type [employer]    PT Start Time 0804    PT Stop Time 0846    PT Time Calculation (min) 42 min    Activity Tolerance Patient tolerated treatment well;Patient limited by pain    Behavior During Therapy [facility] for tasks assessed/performed                Past Medical History:  Diagnosis Date   Anxiety    CHF (congestive heart failure) (HCC)    Hypertension    SVT (supraventricular tachycardia) (HCC)    Past Surgical History:  Procedure Laterality Date   CESAREAN SECTION     GUM SURGERY     TUBAL LIGATION     Patient Active Problem List   Diagnosis Date Noted   Vitiligo 07/27/2021   Obstructive sleep apnea 03/24/2021   Stutter 03/24/2021   Paroxysmal SVT (supraventricular tachycardia) (HCC) 11/18/2020   Normocytic anemia 10/20/2020   Spondylolisthesis at L5-S1 level 10/20/2020   Prediabetes 10/20/2020   Hypertrophic cardiomyopathy (HCC) 10/13/2020   Morbid obesity with BMI of 50.0-59.9, adult (HCC) 10/13/2020   Essential hypertension 10/13/2020   Chronic right-sided low back pain with right-sided sciatica 10/13/2020   Borderline hyperlipidemia 03/09/2020   Insomnia 03/09/2020   Mild intermittent asthma 03/09/2020   Moderate recurrent major depression (HCC) 03/09/2020    ONSET DATE: 06/28/21  REFERRING DIAG: U23.5T6R (ICD-10-CM) - Concussion without loss of consciousness, subsequent encounter   THERAPY DIAG:  Unsteadiness on feet  Other symptoms and signs involving the nervous system  Rationale for Evaluation and Treatment Rehabilitation  SUBJECTIVE:   SUBJECTIVE STATEMENT: Feeling sore in my back.  Feeling funny in my head, kind of like a headache. Did not do too good with HEP- been busy. Went to the gym and used the elliptical for 40 min yesterday.  Pt accompanied by: self  PAIN:  Are you having pain? Yes: NPRS scale: 6/10 Pain location: head and LB Pain description: sharp Aggravating factors: bending Relieving factors: propping pillows behind back  PRECAUTIONS: None  PERTINENT HISTORY:  6/28:  Slipped and fell 2 days ago at [facility] and struck her head on the floor, no loss of consciousness.  Reported to the ED after the event and had head CT, cervical CT, thoracic and lumbar spine CT which were all unremarkable for acute abnormalities or fractures.  Lumbar spine did demonstrate anterolisthesis, likely chronic and not acute.  She was diagnosed with a concussion and sent home with narcotics, muscle relaxer, and naproxen to help with muscle aches and headaches.  Today she reports that she continues with headaches averaging about 4 times per day and positive for phonophobia and photophobia with intermittent nausea.  Resting in a dark room with minimal to no sounds helps her headaches.  Muscle aches have worsened since her fall 2 days ago.  Mental and physical activity worsen her symptoms.  No episodes of emesis but does report continued intermittent nausea.  No prior history of concussion.  No focal neurological deficits, acute vision changes, syncope or presyncope, or recent repeat falls since the initial event.  PATIENT GOALS be able  to read, get rid of HA, return to prior activities like the gym  OBJECTIVE:     TODAY'S TREATMENT: 08/17/21 Activity Comments  Sitting in chair with backrest with moist hot back on LB d/t LBP: sitting head turns to targets 2x30" sitting head nod to targets 2x30" C/o 7-8/10 dizziness; after cueing to slow down- 6/10 dizziness   cervical retraction 10x Cueing for smaller amplitude movement and maintaining small chin tuck  Cervical rotation SNAG 10x  Very limited ROM, limited by pain  standing head turns to targets x30" Standing head nod to targets x30" Cues to slow down with improvement in symptoms; c/o nausea  romberg EO firm 30"  Mild-mod sway  romberg EC firm 30" Mild-mod sway; anterior trunk lean  romberg EO foam 30" Mild-mod sway; anterior trunk lean  romberg EC foam 30" Severe sway and occasional UE suport      PATIENT EDUCATION: Education details: educated on finding time in the day to perform HEP for max benefit; encouraged her to speak to sports medicine MD to address orthopedic complaints, reducing time on elliptical in order to improve tolerance  Person educated: Patient Education method: Explanation Education comprehension: verbalized understanding and returned demonstration   HOME EXERCISE PROGRAM Last updated: 08/12/21 Access Code: [email or web address] URL: [email or web address] Date: 08/12/2021 Prepared by: [facility] - Outpatient  Rehab - [facility]  Exercises - Seated Cervical Extension AROM  - 1-3 x daily - 7 x weekly - 1 sets - 10 reps - Seated Cervical Sidebending AROM  - 1-3 x daily - 7 x weekly - 1 sets - 10 reps - Shoulder Rolls in Sitting  - 1 x daily - 5 x weekly - 2 sets - 10 reps - Seated Shoulder Shrugs  - 1 x daily - 5 x weekly - 2 sets - 10 reps - Seated Scapular Retraction  - 1 x daily - 5 x weekly - 2 sets - 10 reps - 3 sec hold - Walking  - 1 x daily - 5 x weekly - 2 sets - 5 reps - Seated Cervical Rotation AROM  - 1-3 x daily - 7 x weekly - 1 sets - 10 reps - Romberg Stance with Eyes Closed  - 1 x daily - 5 x weekly - 2-3 sets - 30 sec hold    Below measures were taken at time of initial evaluation unless otherwise specified:   DIAGNOSTIC FINDINGS: imaging studies negative  COGNITION: Overall cognitive status: Within functional limits for tasks assessed   SENSATION: WFL    POSTURE: anterior pelvic tilt and flexed trunk    Cervical ROM:    Active A/PROM (deg) eval   Flexion WFL  Extension WFL  Right lateral flexion 25% limited  Left lateral flexion 25% limited  Right rotation: 25% limited 25%% limited    Left rotation: 25% limited   (Blank rows = not tested)  STRENGTH:   LOWER EXTREMITY MMT:   (Blank rows = not tested)    GAIT: Gait pattern: WFL Distance walked:  Assistive device utilized: None Level of assistance: Complete Independence Comments:   FUNCTIONAL TESTs:  DGI: 22/24 M-CTSIB: mild sway condition 2, mild-mod sway condition 4  PATIENT SURVEYS:  FOTO 54.19   VESTIBULAR ASSESSMENT   GENERAL OBSERVATION: bilateral lid ptosis?    SYMPTOM BEHAVIOR:   Subjective history: 6/26 fall with hitting head on ground, no LOC   Non-Vestibular symptoms: changes in vision, neck pain, headaches, and nausea/vomiting   Type of dizziness: Blurred Vision and Lightheadedness/Faint  Frequency: daily   Duration: seconds   Aggravating factors: Induced by motion: occur when walking and turning head quickly and Worse in the morning   Relieving factors: head stationary, dark room, closing eyes, and rest   Progression of symptoms: better   OCULOMOTOR EXAM:   Ocular Alignment: normal   Ocular ROM: No Limitations   Spontaneous Nystagmus: absent   Gaze-Induced Nystagmus: absent   Smooth Pursuits: intact   Saccades: intact   Convergence/Divergence: 22 cm  Reports feeling frontal headache with these movements      VESTIBULAR - OCULAR REFLEX:    Slow VOR: Normal and Comment: slow movement with report of increased symptoms   VOR Cancellation: Normal, reports increased discomfort   Head-Impulse Test: HIT Right: positive HIT Left: negative --Reports feeling increased symptoms   Dynamic Visual Acuity: Not able to be assessed    POSITIONAL TESTING: Denies any positional provocation    MOTION SENSITIVITY: reports 7/10 discomfort with movements    Motion Sensitivity Quotient  Intensity: 0 = none, 1 = Lightheaded, 2 = Mild, 3 = Moderate,  4 = Severe, 5 = Vomiting  Intensity  1. Sitting to supine   2. Supine to L side   3. Supine to R side   4. Supine to sitting   5. L Hallpike-Dix   6. Up from L    7. R Hallpike-Dix   8. Up from R    9. Sitting, head  tipped to L knee   10. Head up from L  knee   11. Sitting, head  tipped to R knee   12. Head up from R  knee   13. Sitting head turns x5   14.Sitting head nods x5   15. In stance, 180  turn to L    16. In stance, 180  turn to R     OTHOSTATICS: not done  FUNCTIONAL GAIT:    VESTIBULAR TREATMENT/HEP:  Gaze Adaptation:   x1 Viewing Horizontal: Position: sitting, Time: 15-30 sec, and Reps: 3-5 and x1 Viewing Vertical:  Position: sitting and Time: 15-30 sec Habituation:   Other: to be initiated as indicated Access Code: [email or web address] - Seated Cervical Extension AROM  - 1-3 x daily - 7 x weekly - 1 sets - 10 reps - Seated Cervical Sidebending AROM  - 1-3 x daily - 7 x weekly - 1 sets - 10 reps - Seated Cervical Rotation AROM  - 1-3 x daily - 7 x weekly - 1 sets - 10 reps -Corner balance: eyes closed, EO semi-tandem, etc  PATIENT EDUCATION: Education details: vestibular rehabilitation Person educated: Patient Education method: Explanation Education comprehension: verbalized understanding   GOALS: Goals reviewed with patient? Yes  SHORT TERM GOALS: Target date: 09/06/2021   The patient will be independent with HEP for gaze adaptation, habituation, balance, and general mobility. Baseline: Goal status: IN PROGRESS   2. Demonstrate full cervical spine ROM to improve comfort and safety with driving  Baseline: 33% limitation with rotation and lateral flexion due to tightness/pain  Goal status: IN PROGRESS    LONG TERM GOALS: Target date: 10/18/2021    Patient will manifest improved symptoms and function as indicated by score of 61 FOTO assessment Baseline: 54 Goal status: IN PROGRESS  2.  Pt to report improved symptoms as evidenced by ability to  perform reading activity without limitation/discomfort Baseline: unable, demo convergence tolerance of 22 cm Goal status: IN PROGRESS  3.  Manifest improved symptoms per report of ability to return to gym  routine 3 days/week Baseline: unable to perform at this time Goal status: IN PROGRESS   ASSESSMENT:  CLINICAL IMPRESSION: Patient arrived to session with report of LBP and HA. 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 Provided more supportive chair and moist hot pack to LB to address pain while performing gaze stabilization activities. D/t patient's severity of symptoms, frequent cueing was required to decrease speed of head movements to avoid excessive symptom provocation. Cervical ROM and postural strengthening to address HA required intermittent cues to correct form. Patient tolerated duration of session despite c/o pain and dizziness.     OBJECTIVE IMPAIRMENTS decreased activity tolerance, decreased balance, decreased mobility, decreased ROM, dizziness, impaired perceived functional ability, and pain.   ACTIVITY LIMITATIONS bending, locomotion level, and caring for others  PARTICIPATION LIMITATIONS: cleaning, driving, shopping, and community activity  PERSONAL FACTORS Time since onset of injury/illness/exacerbation and 1 comorbidity: CHF  are also affecting patient's functional outcome.   REHAB POTENTIAL: Good  CLINICAL DECISION MAKING: Evolving/moderate complexity  EVALUATION COMPLEXITY: Moderate   PLAN: PT FREQUENCY: 1-2x/week  PT DURATION: 12 weeks  PLANNED INTERVENTIONS: Therapeutic exercises, Therapeutic activity, Neuromuscular re-education, Balance training, Gait training, Patient/Family education, Self Care, Joint mobilization, Stair training, Vestibular training, Canalith repositioning, DME instructions, Dry Needling, and Manual therapy  PLAN FOR NEXT SESSION: neck stretching/posture. Continue with VOR   Anette Guarneri, PT, DPT 08/17/21 8:50 AM  [facility] at [facility] [address] [place], [address] [place], [place] [address] Phone # [phone] Fax # [phone]

## 2021-08-17 ENCOUNTER — Encounter: Payer: Self-pay | Admitting: Physical Therapy

## 2021-08-17 ENCOUNTER — Ambulatory Visit: Payer: Medicare Other | Admitting: Physical Therapy

## 2021-08-17 DIAGNOSIS — R29818 Other symptoms and signs involving the nervous system: Secondary | ICD-10-CM

## 2021-08-17 DIAGNOSIS — R2681 Unsteadiness on feet: Secondary | ICD-10-CM | POA: Diagnosis not present

## 2021-08-19 ENCOUNTER — Ambulatory Visit: Payer: Medicare Other

## 2021-08-19 DIAGNOSIS — R29818 Other symptoms and signs involving the nervous system: Secondary | ICD-10-CM

## 2021-08-19 DIAGNOSIS — R2681 Unsteadiness on feet: Secondary | ICD-10-CM | POA: Diagnosis not present

## 2021-08-19 NOTE — Therapy (Signed)
OUTPATIENT PHYSICAL THERAPY VESTIBULAR TREATMENT     Patient Name: Katherine Parker MRN: [id number] DOB:1965-04-14, 56 y.o., female Today's Date: 08/19/2021  PCP: [name] REFERRING PROVIDER: Lenda Kelp, MD    PT End of Session - 08/19/21 [phone]     Visit Number 6    Number of Visits 12    Date for PT Re-Evaluation 10/18/21    Authorization Type [employer] Medicare    PT Start Time 0930    PT Stop Time 1015    PT Time Calculation (min) 45 min    Activity Tolerance Patient tolerated treatment well;Patient limited by pain    Behavior During Therapy [facility] for tasks assessed/performed                Past Medical History:  Diagnosis Date   Anxiety    CHF (congestive heart failure) (HCC)    Hypertension    SVT (supraventricular tachycardia) (HCC)    Past Surgical History:  Procedure Laterality Date   CESAREAN SECTION     GUM SURGERY     TUBAL LIGATION     Patient Active Problem List   Diagnosis Date Noted   Vitiligo 07/27/2021   Obstructive sleep apnea 03/24/2021   Stutter 03/24/2021   Paroxysmal SVT (supraventricular tachycardia) (HCC) 11/18/2020   Normocytic anemia 10/20/2020   Spondylolisthesis at L5-S1 level 10/20/2020   Prediabetes 10/20/2020   Hypertrophic cardiomyopathy (HCC) 10/13/2020   Morbid obesity with BMI of 50.0-59.9, adult (HCC) 10/13/2020   Essential hypertension 10/13/2020   Chronic right-sided low back pain with right-sided sciatica 10/13/2020   Borderline hyperlipidemia 03/09/2020   Insomnia 03/09/2020   Mild intermittent asthma 03/09/2020   Moderate recurrent major depression (HCC) 03/09/2020    ONSET DATE: 06/28/21  REFERRING DIAG: R67.8L3Y (ICD-10-CM) - Concussion without loss of consciousness, subsequent encounter   THERAPY DIAG:  Unsteadiness on feet  Other symptoms and signs involving the nervous system  Rationale for Evaluation and Treatment Rehabilitation  SUBJECTIVE:   SUBJECTIVE STATEMENT: Still been able to go to the  gym.  Low back is hurting a little less  Pt accompanied by: self  PAIN:  Are you having pain? Yes: NPRS scale: 6/10 Pain location: head and LB Pain description: sharp Aggravating factors: bending Relieving factors: propping pillows behind back  PRECAUTIONS: None  PERTINENT HISTORY:  6/28:  Slipped and fell 2 days ago at [facility] and struck her head on the floor, no loss of consciousness.  Reported to the ED after the event and had head CT, cervical CT, thoracic and lumbar spine CT which were all unremarkable for acute abnormalities or fractures.  Lumbar spine did demonstrate anterolisthesis, likely chronic and not acute.  She was diagnosed with a concussion and sent home with narcotics, muscle relaxer, and naproxen to help with muscle aches and headaches.  Today she reports that she continues with headaches averaging about 4 times per day and positive for phonophobia and photophobia with intermittent nausea.  Resting in a dark room with minimal to no sounds helps her headaches.  Muscle aches have worsened since her fall 2 days ago.  Mental and physical activity worsen her symptoms.  No episodes of emesis but does report continued intermittent nausea.  No prior history of concussion.  No focal neurological deficits, acute vision changes, syncope or presyncope, or recent repeat falls since the initial event.  PATIENT GOALS be able to read, get rid of HA, return to prior activities like the gym  OBJECTIVE:   TODAY'S TREATMENT: 08/19/21  Activity Comments  -Seated cervical AROM 1x10 -Cervical lateral flexion stretch 2x30 sec MHP applied to low back  -Seated VOR x 1 (target 5 ft away)--30 sec -seated VOR x 1 (arm's length)-- 30 sec Horizontal: 4/10 dizziness Vertical: 4/10 symptoms --exhibits some difficulty with maintaining gaze stability  Shoulder rolls 10x  -standing VOR x 1 (target 5 ft away) -standing VOR x 1 (target arm's length) Horizontal: 4/10 symptoms Vertical: 4/10 symptoms   --no difference in symptoms with target at distance vs close  Walking convergence Start at 15 ft, image changes at 2 ft  VOR cancellation (standing) 3x15 sec  Standing vestibular ball circles  1x5 clockwise/counter 3.3#  Standing on foam  EO x 30 sec EC x 15 sec Head turns 5x EO/EC (mod sway EC)     TODAY'S TREATMENT: 08/17/21 Activity Comments  Sitting in chair with backrest with moist hot back on LB d/t LBP: sitting head turns to targets 2x30" sitting head nod to targets 2x30" C/o 7-8/10 dizziness; after cueing to slow down- 6/10 dizziness   cervical retraction 10x Cueing for smaller amplitude movement and maintaining small chin tuck  Cervical rotation SNAG 10x Very limited ROM, limited by pain  standing head turns to targets x30" Standing head nod to targets x30" Cues to slow down with improvement in symptoms; c/o nausea  romberg EO firm 30"  Mild-mod sway  romberg EC firm 30" Mild-mod sway; anterior trunk lean  romberg EO foam 30" Mild-mod sway; anterior trunk lean  romberg EC foam 30" Severe sway and occasional UE suport      PATIENT EDUCATION: Education details: educated on finding time in the day to perform HEP for max benefit; encouraged her to speak to sports medicine MD to address orthopedic complaints, reducing time on elliptical in order to improve tolerance  Person educated: Patient Education method: Explanation Education comprehension: verbalized understanding and returned demonstration   HOME EXERCISE PROGRAM Last updated: 08/12/21 Access Code: [email or web address] URL: [email or web address] Date: 08/12/2021 Prepared by: [facility] - Outpatient  Rehab - [facility]  Exercises - Seated Cervical Extension AROM  - 1-3 x daily - 7 x weekly - 1 sets - 10 reps - Seated Cervical Sidebending AROM  - 1-3 x daily - 7 x weekly - 1 sets - 10 reps - Shoulder Rolls in Sitting  - 1 x daily - 5 x weekly - 2 sets - 10 reps - Seated Shoulder Shrugs  - 1 x daily - 5 x  weekly - 2 sets - 10 reps - Seated Scapular Retraction  - 1 x daily - 5 x weekly - 2 sets - 10 reps - 3 sec hold - Walking  - 1 x daily - 5 x weekly - 2 sets - 5 reps - Seated Cervical Rotation AROM  - 1-3 x daily - 7 x weekly - 1 sets - 10 reps - Romberg Stance with Eyes Closed  - 1 x daily - 5 x weekly - 2-3 sets - 30 sec hold Access Code: [email or web address] - Standing VOR Cancellation  - 1 x daily - 7 x weekly - 3 sets - 15 sec sessions hold - Standing Vestibular Ball Circles  - 1 x daily - 7 x weekly - 10 reps   Below measures were taken at time of initial evaluation unless otherwise specified:   DIAGNOSTIC FINDINGS: imaging studies negative  COGNITION: Overall cognitive status: Within functional limits for tasks assessed   SENSATION: WFL    POSTURE: anterior pelvic tilt and flexed  trunk    Cervical ROM:    Active A/PROM (deg) eval  Flexion WFL  Extension WFL  Right lateral flexion 25% limited  Left lateral flexion 25% limited  Right rotation: 25% limited 25%% limited    Left rotation: 25% limited   (Blank rows = not tested)  STRENGTH:   LOWER EXTREMITY MMT:   (Blank rows = not tested)    GAIT: Gait pattern: WFL Distance walked:  Assistive device utilized: None Level of assistance: Complete Independence Comments:   FUNCTIONAL TESTs:  DGI: 22/24 M-CTSIB: mild sway condition 2, mild-mod sway condition 4  PATIENT SURVEYS:  FOTO 54.19   VESTIBULAR ASSESSMENT   GENERAL OBSERVATION: bilateral lid ptosis?    SYMPTOM BEHAVIOR:   Subjective history: 6/26 fall with hitting head on ground, no LOC   Non-Vestibular symptoms: changes in vision, neck pain, headaches, and nausea/vomiting   Type of dizziness: Blurred Vision and Lightheadedness/Faint   Frequency: daily   Duration: seconds   Aggravating factors: Induced by motion: occur when walking and turning head quickly and Worse in the morning   Relieving factors: head stationary, dark room, closing eyes, and  rest   Progression of symptoms: better   OCULOMOTOR EXAM:   Ocular Alignment: normal   Ocular ROM: No Limitations   Spontaneous Nystagmus: absent   Gaze-Induced Nystagmus: absent   Smooth Pursuits: intact   Saccades: intact   Convergence/Divergence: 22 cm  Reports feeling frontal headache with these movements      VESTIBULAR - OCULAR REFLEX:    Slow VOR: Normal and Comment: slow movement with report of increased symptoms   VOR Cancellation: Normal, reports increased discomfort   Head-Impulse Test: HIT Right: positive HIT Left: negative --Reports feeling increased symptoms   Dynamic Visual Acuity: Not able to be assessed    POSITIONAL TESTING: Denies any positional provocation    MOTION SENSITIVITY: reports 7/10 discomfort with movements    Motion Sensitivity Quotient  Intensity: 0 = none, 1 = Lightheaded, 2 = Mild, 3 = Moderate, 4 = Severe, 5 = Vomiting  Intensity  1. Sitting to supine   2. Supine to L side   3. Supine to R side   4. Supine to sitting   5. L Hallpike-Dix   6. Up from L    7. R Hallpike-Dix   8. Up from R    9. Sitting, head  tipped to L knee   10. Head up from L  knee   11. Sitting, head  tipped to R knee   12. Head up from R  knee   13. Sitting head turns x5   14.Sitting head nods x5   15. In stance, 180  turn to L    16. In stance, 180  turn to R     OTHOSTATICS: not done  FUNCTIONAL GAIT:    VESTIBULAR TREATMENT/HEP:  Gaze Adaptation:   x1 Viewing Horizontal: Position: sitting, Time: 15-30 sec, and Reps: 3-5 and x1 Viewing Vertical:  Position: sitting and Time: 15-30 sec Habituation:   Other: to be initiated as indicated Access Code: [email or web address] - Seated Cervical Extension AROM  - 1-3 x daily - 7 x weekly - 1 sets - 10 reps - Seated Cervical Sidebending AROM  - 1-3 x daily - 7 x weekly - 1 sets - 10 reps - Seated Cervical Rotation AROM  - 1-3 x daily - 7 x weekly - 1 sets - 10 reps -Corner balance: eyes closed, EO semi-tandem,  etc  PATIENT EDUCATION:  Education details: vestibular rehabilitation Person educated: Patient Education method: Explanation Education comprehension: verbalized understanding   GOALS: Goals reviewed with patient? Yes  SHORT TERM GOALS: Target date: 09/06/2021   The patient will be independent with HEP for gaze adaptation, habituation, balance, and general mobility. Baseline: Goal status: IN PROGRESS   2. Demonstrate full cervical spine ROM to improve comfort and safety with driving  Baseline: 51% limitation with rotation and lateral flexion due to tightness/pain  Goal status: IN PROGRESS    LONG TERM GOALS: Target date: 10/18/2021    Patient will manifest improved symptoms and function as indicated by score of 61 FOTO assessment Baseline: 54 Goal status: IN PROGRESS  2.  Pt to report improved symptoms as evidenced by ability to perform reading activity without limitation/discomfort Baseline: unable, demo convergence tolerance of 22 cm Goal status: IN PROGRESS  3.  Manifest improved symptoms per report of ability to return to gym routine 3 days/week Baseline: unable to perform at this time Goal status: IN PROGRESS   ASSESSMENT:  CLINICAL IMPRESSION: When performing VOR gaze stabilization demonstrates initial difficulty maintaining fixation on target with several witnessed corrective saccades noted but demo ability to self-correct with decreased speed of head movements.  Incorporated more dynamic movements to HEP today via ball circles to perform at gym routine and advised on VOR cancellation per symptomatic performance and advised to use blank wall as background progressing to busy environment.  Mod-severe postural sway standing on foam and performing head movements and eyes closed condition. Continued sessions to progress vestibular rehabilitation to reduce symptoms and improve activity tolerance.    OBJECTIVE IMPAIRMENTS decreased activity tolerance, decreased balance,  decreased mobility, decreased ROM, dizziness, impaired perceived functional ability, and pain.   ACTIVITY LIMITATIONS bending, locomotion level, and caring for others  PARTICIPATION LIMITATIONS: cleaning, driving, shopping, and community activity  PERSONAL FACTORS Time since onset of injury/illness/exacerbation and 1 comorbidity: CHF  are also affecting patient's functional outcome.   REHAB POTENTIAL: Good  CLINICAL DECISION MAKING: Evolving/moderate complexity  EVALUATION COMPLEXITY: Moderate   PLAN: PT FREQUENCY: 1-2x/week  PT DURATION: 12 weeks  PLANNED INTERVENTIONS: Therapeutic exercises, Therapeutic activity, Neuromuscular re-education, Balance training, Gait training, Patient/Family education, Self Care, Joint mobilization, Stair training, Vestibular training, Canalith repositioning, DME instructions, Dry Needling, and Manual therapy  PLAN FOR NEXT SESSION: Check neck ROM, low back exercises? Continue with VOR   10:10 AM, 08/19/21 M. Shary Decamp, PT, DPT Physical Therapist- [place] Office Number: [phone]   [facility] at [facility] [address] [place], [place] [address] Phone # [phone] Fax # [phone]

## 2021-08-23 ENCOUNTER — Ambulatory Visit (INDEPENDENT_AMBULATORY_CARE_PROVIDER_SITE_OTHER): Payer: Medicare Other | Admitting: Family Medicine

## 2021-08-24 ENCOUNTER — Ambulatory Visit: Payer: Medicare Other

## 2021-08-24 DIAGNOSIS — R2681 Unsteadiness on feet: Secondary | ICD-10-CM

## 2021-08-24 DIAGNOSIS — R29818 Other symptoms and signs involving the nervous system: Secondary | ICD-10-CM

## 2021-08-24 NOTE — Therapy (Signed)
OUTPATIENT PHYSICAL THERAPY VESTIBULAR TREATMENT     Patient Name: Katherine Parker MRN: [id number] DOB:06/06/1965, 56 y.o., female Today's Date: 08/24/2021  PCP: [name] REFERRING PROVIDER: Lenda Kelp, MD    PT End of Session - 08/24/21 0800     Visit Number 7    Number of Visits 12    Date for PT Re-Evaluation 10/18/21    Authorization Type [employer]    PT Start Time 0800    PT Stop Time 0845    PT Time Calculation (min) 45 min    Activity Tolerance Patient tolerated treatment well;Patient limited by pain    Behavior During Therapy [facility] for tasks assessed/performed                Past Medical History:  Diagnosis Date   Anxiety    CHF (congestive heart failure) (HCC)    Hypertension    SVT (supraventricular tachycardia) (HCC)    Past Surgical History:  Procedure Laterality Date   CESAREAN SECTION     GUM SURGERY     TUBAL LIGATION     Patient Active Problem List   Diagnosis Date Noted   Vitiligo 07/27/2021   Obstructive sleep apnea 03/24/2021   Stutter 03/24/2021   Paroxysmal SVT (supraventricular tachycardia) (HCC) 11/18/2020   Normocytic anemia 10/20/2020   Spondylolisthesis at L5-S1 level 10/20/2020   Prediabetes 10/20/2020   Hypertrophic cardiomyopathy (HCC) 10/13/2020   Morbid obesity with BMI of 50.0-59.9, adult (HCC) 10/13/2020   Essential hypertension 10/13/2020   Chronic right-sided low back pain with right-sided sciatica 10/13/2020   Borderline hyperlipidemia 03/09/2020   Insomnia 03/09/2020   Mild intermittent asthma 03/09/2020   Moderate recurrent major depression (HCC) 03/09/2020    ONSET DATE: 06/28/21  REFERRING DIAG: Y86.5H8I (ICD-10-CM) - Concussion without loss of consciousness, subsequent encounter   THERAPY DIAG:  Unsteadiness on feet  Other symptoms and signs involving the nervous system  Rationale for Evaluation and Treatment Rehabilitation  SUBJECTIVE:   SUBJECTIVE STATEMENT: Feeling much better, been  going to the gym in the AM and feeling much improved with dizziness. Light sensitivity is much better  Pt accompanied by: self  PAIN:  Are you having pain? Yes: NPRS scale: 6/10 Pain location: head and LB Pain description: sharp Aggravating factors: bending Relieving factors: propping pillows behind back  PRECAUTIONS: None  PERTINENT HISTORY:  6/28:  Slipped and fell 2 days ago at [facility] and struck her head on the floor, no loss of consciousness.  Reported to the ED after the event and had head CT, cervical CT, thoracic and lumbar spine CT which were all unremarkable for acute abnormalities or fractures.  Lumbar spine did demonstrate anterolisthesis, likely chronic and not acute.  She was diagnosed with a concussion and sent home with narcotics, muscle relaxer, and naproxen to help with muscle aches and headaches.  Today she reports that she continues with headaches averaging about 4 times per day and positive for phonophobia and photophobia with intermittent nausea.  Resting in a dark room with minimal to no sounds helps her headaches.  Muscle aches have worsened since her fall 2 days ago.  Mental and physical activity worsen her symptoms.  No episodes of emesis but does report continued intermittent nausea.  No prior history of concussion.  No focal neurological deficits, acute vision changes, syncope or presyncope, or recent repeat falls since the initial event.  PATIENT GOALS be able to read, get rid of HA, return to prior activities like the gym  OBJECTIVE:   TODAY'S TREATMENT: 08/24/21 Activity Comments  Seated c-spine AROM and shoulder rolls 1x10   Seated VOR x 30 sec (target 5 ft). Horizontal/vertical Seated VOR x 30 sec (target arm's length) Horizontal/vertical Notes neck tension with this 5/10. Notes the vertical direction feels more symptomatic  Half turns to table with playing cards Quick turns left/right to scan table top for target  Standing VOR x 30 sec Target behind  blinds horizontal/vertical--requires decreased speed  Walking convergence: forward/backward 4x15 ft, stops 2.5 ft from target  Walking VOR: forward/backward 4x15 ft horizontal/vertical   Half turns to cone taps 3x left/right  Ball circles (VOR cancellation) 5x clockwise/counter. Standing on firm and foam  Standing on foam EO/EC 2x15 sec Head turns/vertical EO/EC 5x       PATIENT EDUCATION: Education details: educated on finding time in the day to perform HEP for max benefit; encouraged her to speak to sports medicine MD to address orthopedic complaints, reducing time on elliptical in order to improve tolerance  Person educated: Patient Education method: Explanation Education comprehension: verbalized understanding and returned demonstration   HOME EXERCISE PROGRAM Last updated: 08/12/21 Access Code: [email or web address] URL: [email or web address] Date: 08/12/2021 Prepared by: [facility]  Exercises - Seated Cervical Extension AROM  - 1-3 x daily - 7 x weekly - 1 sets - 10 reps - Seated Cervical Sidebending AROM  - 1-3 x daily - 7 x weekly - 1 sets - 10 reps - Shoulder Rolls in Sitting  - 1 x daily - 5 x weekly - 2 sets - 10 reps - Seated Shoulder Shrugs  - 1 x daily - 5 x weekly - 2 sets - 10 reps - Seated Scapular Retraction  - 1 x daily - 5 x weekly - 2 sets - 10 reps - 3 sec hold - Walking  - 1 x daily - 5 x weekly - 2 sets - 5 reps - Seated Cervical Rotation AROM  - 1-3 x daily - 7 x weekly - 1 sets - 10 reps - Romberg Stance with Eyes Closed  - 1 x daily - 5 x weekly - 2-3 sets - 30 sec hold Access Code: [email or web address] - Standing VOR Cancellation  - 1 x daily - 7 x weekly - 3 sets - 15 sec sessions hold - Standing Vestibular Ball Circles  - 1 x daily - 7 x weekly - 10 reps   Below measures were taken at time of initial evaluation unless otherwise specified:   DIAGNOSTIC FINDINGS: imaging studies negative  COGNITION: Overall cognitive  status: Within functional limits for tasks assessed   SENSATION: WFL    POSTURE: anterior pelvic tilt and flexed trunk    Cervical ROM:    Active A/PROM (deg) eval  Flexion WFL  Extension WFL  Right lateral flexion 25% limited  Left lateral flexion 25% limited  Right rotation: 25% limited 25%% limited    Left rotation: 25% limited   (Blank rows = not tested)  STRENGTH:   LOWER EXTREMITY MMT:   (Blank rows = not tested)    GAIT: Gait pattern: WFL Distance walked:  Assistive device utilized: None Level of assistance: Complete Independence Comments:   FUNCTIONAL TESTs:  DGI: 22/24 M-CTSIB: mild sway condition 2, mild-mod sway condition 4  PATIENT SURVEYS:  FOTO 54.19   VESTIBULAR ASSESSMENT   GENERAL OBSERVATION: bilateral lid ptosis?    SYMPTOM BEHAVIOR:   Subjective history: 6/26 fall with hitting head on ground, no LOC  Non-Vestibular symptoms: changes in vision, neck pain, headaches, and nausea/vomiting   Type of dizziness: Blurred Vision and Lightheadedness/Faint   Frequency: daily   Duration: seconds   Aggravating factors: Induced by motion: occur when walking and turning head quickly and Worse in the morning   Relieving factors: head stationary, dark room, closing eyes, and rest   Progression of symptoms: better   OCULOMOTOR EXAM:   Ocular Alignment: normal   Ocular ROM: No Limitations   Spontaneous Nystagmus: absent   Gaze-Induced Nystagmus: absent   Smooth Pursuits: intact   Saccades: intact   Convergence/Divergence: 22 cm  Reports feeling frontal headache with these movements      VESTIBULAR - OCULAR REFLEX:    Slow VOR: Normal and Comment: slow movement with report of increased symptoms   VOR Cancellation: Normal, reports increased discomfort   Head-Impulse Test: HIT Right: positive HIT Left: negative --Reports feeling increased symptoms   Dynamic Visual Acuity: Not able to be assessed    POSITIONAL TESTING: Denies any  positional provocation    MOTION SENSITIVITY: reports 7/10 discomfort with movements    Motion Sensitivity Quotient  Intensity: 0 = none, 1 = Lightheaded, 2 = Mild, 3 = Moderate, 4 = Severe, 5 = Vomiting  Intensity  1. Sitting to supine   2. Supine to L side   3. Supine to R side   4. Supine to sitting   5. L Hallpike-Dix   6. Up from L    7. R Hallpike-Dix   8. Up from R    9. Sitting, head  tipped to L knee   10. Head up from L  knee   11. Sitting, head  tipped to R knee   12. Head up from R  knee   13. Sitting head turns x5   14.Sitting head nods x5   15. In stance, 180  turn to L    16. In stance, 180  turn to R     OTHOSTATICS: not done  FUNCTIONAL GAIT:    VESTIBULAR TREATMENT/HEP:  Gaze Adaptation:   x1 Viewing Horizontal: Position: sitting, Time: 15-30 sec, and Reps: 3-5 and x1 Viewing Vertical:  Position: sitting and Time: 15-30 sec Habituation:   Other: to be initiated as indicated Access Code: [email or web address] - Seated Cervical Extension AROM  - 1-3 x daily - 7 x weekly - 1 sets - 10 reps - Seated Cervical Sidebending AROM  - 1-3 x daily - 7 x weekly - 1 sets - 10 reps - Seated Cervical Rotation AROM  - 1-3 x daily - 7 x weekly - 1 sets - 10 reps -Corner balance: eyes closed, EO semi-tandem, etc  PATIENT EDUCATION: Education details: vestibular rehabilitation Person educated: Patient Education method: Explanation Education comprehension: verbalized understanding   GOALS: Goals reviewed with patient? Yes  SHORT TERM GOALS: Target date: 09/06/2021   The patient will be independent with HEP for gaze adaptation, habituation, balance, and general mobility. Baseline: Goal status: IN PROGRESS   2. Demonstrate full cervical spine ROM to improve comfort and safety with driving  Baseline: [id number] limitation with rotation and lateral flexion due to tightness/pain  Goal status: IN PROGRESS    LONG TERM GOALS: Target date: 10/18/2021    Patient will  manifest improved symptoms and function as indicated by score of 61 FOTO assessment Baseline: 54 Goal status: IN PROGRESS  2.  Pt to report improved symptoms as evidenced by ability to perform reading activity without limitation/discomfort Baseline: unable, demo convergence tolerance  of 22 cm Goal status: IN PROGRESS  3.  Manifest improved symptoms per report of ability to return to gym routine 3 days/week Baseline: unable to perform at this time Goal status: IN PROGRESS   ASSESSMENT:  CLINICAL IMPRESSION: Pt reports improved symptoms in her day-to-day routine and notes decrease in light/sound sensitivity.  Demonstrating improved activity tolerance and likely reduced symptoms as evidenced by ability to progress treatment session today performing VOR exercises in various positions and addition of movement (walking) VOR and maintaining normal pathway without significant deviation albeit reduced gait speed which is not unusual.  Also, able to implement VOR with busy background of window blinds for added challenge.  Pt notes that the vertical direction for VOR is more symptomatic than horizontal. Improved activity tolerance for corrective exercises as little rest periods were implemented today between drills only requiring 30-60 sec rest periods and pt rates overall feeling 4-5/10 in symptoms.    OBJECTIVE IMPAIRMENTS decreased activity tolerance, decreased balance, decreased mobility, decreased ROM, dizziness, impaired perceived functional ability, and pain.   ACTIVITY LIMITATIONS bending, locomotion level, and caring for others  PARTICIPATION LIMITATIONS: cleaning, driving, shopping, and community activity  PERSONAL FACTORS Time since onset of injury/illness/exacerbation and 1 comorbidity: CHF  are also affecting patient's functional outcome.   REHAB POTENTIAL: Good  CLINICAL DECISION MAKING: Evolving/moderate complexity  EVALUATION COMPLEXITY: Moderate   PLAN: PT FREQUENCY:  1-2x/week  PT DURATION: 12 weeks  PLANNED INTERVENTIONS: Therapeutic exercises, Therapeutic activity, Neuromuscular re-education, Balance training, Gait training, Patient/Family education, Self Care, Joint mobilization, Stair training, Vestibular training, Canalith repositioning, DME instructions, Dry Needling, and Manual therapy  PLAN FOR NEXT SESSION: Check neck ROM, low back exercises? Continue with VOR   8:00 AM, 08/24/21 M. Shary Decamp, PT, DPT Physical Therapist- [place] Office Number: [phone]   [facility] at [facility] [address] [place], [place] [address] Phone # [phone] Fax # [phone]

## 2021-08-26 ENCOUNTER — Ambulatory Visit: Payer: Medicare Other

## 2021-08-26 DIAGNOSIS — R2681 Unsteadiness on feet: Secondary | ICD-10-CM | POA: Diagnosis not present

## 2021-08-26 DIAGNOSIS — R29818 Other symptoms and signs involving the nervous system: Secondary | ICD-10-CM

## 2021-08-26 NOTE — Therapy (Addendum)
OUTPATIENT PHYSICAL THERAPY VESTIBULAR TREATMENT     Patient Name: Katherine Parker MRN: [id number] DOB:08-May-1965, 56 y.o., female Today's Date: 08/26/2021  PCP: Arlester Marker REFERRING PROVIDER: Dene Gentry, MD    PT End of Session - 08/26/21 0757     Visit Number 8    Number of Visits 12    Date for PT Re-Evaluation 10/18/21    Authorization Type [employer] Medicare    PT Start Time 0800    PT Stop Time 0845    PT Time Calculation (min) 45 min    Activity Tolerance Patient tolerated treatment well;Patient limited by pain    Behavior During Therapy [facility] for tasks assessed/performed                Past Medical History:  Diagnosis Date   Anxiety    CHF (congestive heart failure) ([place])    Hypertension    SVT (supraventricular tachycardia) ([place])    Past Surgical History:  Procedure Laterality Date   CESAREAN SECTION     GUM SURGERY     TUBAL LIGATION     Patient Active Problem List   Diagnosis Date Noted   Vitiligo 07/27/2021   Obstructive sleep apnea 03/24/2021   Stutter 03/24/2021   Paroxysmal SVT (supraventricular tachycardia) ([place]) 11/18/2020   Normocytic anemia 10/20/2020   Spondylolisthesis at L5-S1 level 10/20/2020   Prediabetes 10/20/2020   Hypertrophic cardiomyopathy ([place]) 10/13/2020   Morbid obesity with BMI of 50.0-59.9, adult ([place]) 10/13/2020   Essential hypertension 10/13/2020   Chronic right-sided low back pain with right-sided sciatica 10/13/2020   Borderline hyperlipidemia 03/09/2020   Insomnia 03/09/2020   Mild intermittent asthma 03/09/2020   Moderate recurrent major depression ([place]) 03/09/2020    ONSET DATE: 06/28/21  REFERRING DIAG: O70.9G2E (ICD-10-CM) - Concussion without loss of consciousness, subsequent encounter   THERAPY DIAG:  Unsteadiness on feet  Other symptoms and signs involving the nervous system  Rationale for Evaluation and Treatment Rehabilitation  SUBJECTIVE:   SUBJECTIVE STATEMENT: Notes that overall feeling  better and slept well that night after last session. Notes that the gym environment is not overwhelming to the senses. Able to perform neck ROM without pain and notes improved comfort with driving  Pt accompanied by: self  PAIN:  Are you having pain? Yes: NPRS scale: 6/10 Pain location: head and LB Pain description: sharp Aggravating factors: bending Relieving factors: propping pillows behind back  PRECAUTIONS: None  PERTINENT HISTORY:  6/28:  Slipped and fell 2 days ago at [facility] and struck her head on the floor, no loss of consciousness.  Reported to the ED after the event and had head CT, cervical CT, thoracic and lumbar spine CT which were all unremarkable for acute abnormalities or fractures.  Lumbar spine did demonstrate anterolisthesis, likely chronic and not acute.  She was diagnosed with a concussion and sent home with narcotics, muscle relaxer, and naproxen to help with muscle aches and headaches.  Today she reports that she continues with headaches averaging about 4 times per day and positive for phonophobia and photophobia with intermittent nausea.  Resting in a dark room with minimal to no sounds helps her headaches.  Muscle aches have worsened since her fall 2 days ago.  Mental and physical activity worsen her symptoms.  No episodes of emesis but does report continued intermittent nausea.  No prior history of concussion.  No focal neurological deficits, acute vision changes, syncope or presyncope, or recent repeat falls since the initial event.  PATIENT GOALS be  able to read, get rid of HA, return to prior activities like the gym  OBJECTIVE:  TODAY'S TREATMENT: 08/27/22 Activity Comments  C-spine AROM 1x10 Full ROM  Seated VOR x 30 sec (target 5 ft). Horizontal/vertical Seated VOR x 30 sec (target arm's length) Horizontal/vertical Metronome at 120 bpm, no difference in symptoms between target distance. 4/10 symptoms  Standing VOR x 1 x 30 sec Standing in front of window  blinds. No effect with horizontal. 4/10 vertical  VOR cancellation Vertical via medball swing low-high 2x30 sec. Med ball trunk twists 2x30 for horizontal  Walking VOR forward/backward: horizontal/vertical   Walking on treadmill x 6 min 1-1.5 mph 3 stationary targets, moving playing cards  Standing on foam -Staggered stance EO/EC x 15 sec. Head turns EO/EC 5x -VOR cancellation horizontal/vertical 10x red med ball      PATIENT EDUCATION: Education details: educated on finding time in the day to perform HEP for max benefit; encouraged her to speak to sports medicine MD to address orthopedic complaints, reducing time on elliptical in order to improve tolerance  Person educated: Patient Education method: Explanation Education comprehension: verbalized understanding and returned demonstration   HOME EXERCISE PROGRAM Last updated: 08/12/21 Access Code: [email or web address] URL: [email or web address] Date: 08/12/2021 Prepared by: [facility]  Exercises - Seated Cervical Extension AROM  - 1-3 x daily - 7 x weekly - 1 sets - 10 reps - Seated Cervical Sidebending AROM  - 1-3 x daily - 7 x weekly - 1 sets - 10 reps - Shoulder Rolls in Sitting  - 1 x daily - 5 x weekly - 2 sets - 10 reps - Seated Shoulder Shrugs  - 1 x daily - 5 x weekly - 2 sets - 10 reps - Seated Scapular Retraction  - 1 x daily - 5 x weekly - 2 sets - 10 reps - 3 sec hold - Walking  - 1 x daily - 5 x weekly - 2 sets - 5 reps - Seated Cervical Rotation AROM  - 1-3 x daily - 7 x weekly - 1 sets - 10 reps - Romberg Stance with Eyes Closed  - 1 x daily - 5 x weekly - 2-3 sets - 30 sec hold Access Code: [email or web address] - Standing VOR Cancellation  - 1 x daily - 7 x weekly - 3 sets - 15 sec sessions hold - Standing Vestibular Ball Circles  - 1 x daily - 7 x weekly - 10 reps   Below measures were taken at time of initial evaluation unless otherwise specified:   DIAGNOSTIC FINDINGS: imaging  studies negative  COGNITION: Overall cognitive status: Within functional limits for tasks assessed   SENSATION: WFL    POSTURE: anterior pelvic tilt and flexed trunk    Cervical ROM:    Active A/PROM (deg) eval  Flexion WFL  Extension WFL  Right lateral flexion 25% limited  Left lateral flexion 25% limited  Right rotation: 25% limited 25%% limited    Left rotation: 25% limited   (Blank rows = not tested)  STRENGTH:   LOWER EXTREMITY MMT:   (Blank rows = not tested)    GAIT: Gait pattern: WFL Distance walked:  Assistive device utilized: None Level of assistance: Complete Independence Comments:   FUNCTIONAL TESTs:  DGI: 22/24 M-CTSIB: mild sway condition 2, mild-mod sway condition 4  PATIENT SURVEYS:  FOTO 54.19   VESTIBULAR ASSESSMENT   GENERAL OBSERVATION: bilateral lid ptosis?    SYMPTOM BEHAVIOR:   Subjective  history: 6/26 fall with hitting head on ground, no LOC   Non-Vestibular symptoms: changes in vision, neck pain, headaches, and nausea/vomiting   Type of dizziness: Blurred Vision and Lightheadedness/Faint   Frequency: daily   Duration: seconds   Aggravating factors: Induced by motion: occur when walking and turning head quickly and Worse in the morning   Relieving factors: head stationary, dark room, closing eyes, and rest   Progression of symptoms: better   OCULOMOTOR EXAM:   Ocular Alignment: normal   Ocular ROM: No Limitations   Spontaneous Nystagmus: absent   Gaze-Induced Nystagmus: absent   Smooth Pursuits: intact   Saccades: intact   Convergence/Divergence: 22 cm  Reports feeling frontal headache with these movements      VESTIBULAR - OCULAR REFLEX:    Slow VOR: Normal and Comment: slow movement with report of increased symptoms   VOR Cancellation: Normal, reports increased discomfort   Head-Impulse Test: HIT Right: positive HIT Left: negative --Reports feeling increased symptoms   Dynamic Visual Acuity: Not able to be  assessed    POSITIONAL TESTING: Denies any positional provocation    MOTION SENSITIVITY: reports 7/10 discomfort with movements    Motion Sensitivity Quotient  Intensity: 0 = none, 1 = Lightheaded, 2 = Mild, 3 = Moderate, 4 = Severe, 5 = Vomiting  Intensity  1. Sitting to supine   2. Supine to L side   3. Supine to R side   4. Supine to sitting   5. L Hallpike-Dix   6. Up from L    7. R Hallpike-Dix   8. Up from R    9. Sitting, head  tipped to L knee   10. Head up from L  knee   11. Sitting, head  tipped to R knee   12. Head up from R  knee   13. Sitting head turns x5   14.Sitting head nods x5   15. In stance, 180  turn to L    16. In stance, 180  turn to R     OTHOSTATICS: not done  FUNCTIONAL GAIT:    VESTIBULAR TREATMENT/HEP:  Gaze Adaptation:   x1 Viewing Horizontal: Position: sitting, Time: 15-30 sec, and Reps: 3-5 and x1 Viewing Vertical:  Position: sitting and Time: 15-30 sec Habituation:   Other: to be initiated as indicated Access Code: [email or web address] - Seated Cervical Extension AROM  - 1-3 x daily - 7 x weekly - 1 sets - 10 reps - Seated Cervical Sidebending AROM  - 1-3 x daily - 7 x weekly - 1 sets - 10 reps - Seated Cervical Rotation AROM  - 1-3 x daily - 7 x weekly - 1 sets - 10 reps -Corner balance: eyes closed, EO semi-tandem, etc  PATIENT EDUCATION: Education details: vestibular rehabilitation Person educated: Patient Education method: Explanation Education comprehension: verbalized understanding   GOALS: Goals reviewed with patient? Yes  SHORT TERM GOALS: Target date: 09/06/2021   The patient will be independent with HEP for gaze adaptation, habituation, balance, and general mobility. Baseline: Goal status: IN PROGRESS   2. Demonstrate full cervical spine ROM to improve comfort and safety with driving  Baseline: 10% limitation with rotation and lateral flexion due to tightness/pain; WNL noted on 08/26/21  Goal status: MET    LONG  TERM GOALS: Target date: 10/18/2021    Patient will manifest improved symptoms and function as indicated by score of 61 FOTO assessment Baseline: 54 Goal status: IN PROGRESS  2.  Pt to report improved symptoms  as evidenced by ability to perform reading activity without limitation/discomfort Baseline: unable, demo convergence tolerance of 22 cm Goal status: IN PROGRESS  3.  Manifest improved symptoms per report of ability to return to gym routine 3 days/week Baseline: unable to perform at this time; able to attend gym/fitness routine Goal status: MET   ASSESSMENT:  CLINICAL IMPRESSION: Pt reports continued improvement in symptoms with decrease provocation with VOR movements and improved visual tracking with walking/movement with report of decrease in symptoms.  Good tolerance to walking on treadmill and performing visual fixation on stationary and alternating with moving targets requiring repeated and sustained head movements without LOB.  Pt reports ability to go to gym and perform fitness routine without much issue/provocation from busy environment and exertion.  Pt notes main movement that provokes symptoms are the vertical VOR and vertical VOR cancellation. Continued sessions to progess HEP and vestibular rehab   OBJECTIVE IMPAIRMENTS decreased activity tolerance, decreased balance, decreased mobility, decreased ROM, dizziness, impaired perceived functional ability, and pain.   ACTIVITY LIMITATIONS bending, locomotion level, and caring for others  PARTICIPATION LIMITATIONS: cleaning, driving, shopping, and community activity  PERSONAL FACTORS Time since onset of injury/illness/exacerbation and 1 comorbidity: CHF  are also affecting patient's functional outcome.   REHAB POTENTIAL: Good  CLINICAL DECISION MAKING: Evolving/moderate complexity  EVALUATION COMPLEXITY: Moderate   PLAN: PT FREQUENCY: 1-2x/week  PT DURATION: 12 weeks  PLANNED INTERVENTIONS: Therapeutic exercises,  Therapeutic activity, Neuromuscular re-education, Balance training, Gait training, Patient/Family education, Self Care, Joint mobilization, Stair training, Vestibular training, Canalith repositioning, DME instructions, Dry Needling, and Manual therapy  PLAN FOR NEXT SESSION: VOR x 2   7:58 AM, 08/26/21 M. Sherlyn Lees, PT, DPT Physical Therapist- [place] Office Number: [phone]   [place] [facility] [address], [place], [place] [address] Phone # [phone] Fax # [phone]   PHYSICAL THERAPY DISCHARGE SUMMARY  Visits from Start of Care: 8  Current functional level related to goals / functional outcomes: Unable to assess; patient did not return   Remaining deficits: Unable to assess   Education / Equipment: HEP  Plan: Patient agrees to discharge.  Patient goals were not met. Patient is being discharged due to not returning to PT.    Janene Harvey, PT, DPT 11/24/21 3:41 PM  [facility] at [facility] [place], [place], [place] [address] Phone # [phone] Fax # [phone]

## 2021-09-08 ENCOUNTER — Ambulatory Visit (INDEPENDENT_AMBULATORY_CARE_PROVIDER_SITE_OTHER): Payer: Medicare Other | Admitting: Family Medicine

## 2021-09-08 ENCOUNTER — Encounter: Payer: Self-pay | Admitting: Family Medicine

## 2021-09-08 ENCOUNTER — Other Ambulatory Visit: Payer: Self-pay | Admitting: Family Medicine

## 2021-09-08 VITALS — BP 130/84 | HR 82 | Temp 97.6°F | Ht 66.5 in | Wt 334.4 lb

## 2021-09-08 DIAGNOSIS — J4520 Mild intermittent asthma, uncomplicated: Secondary | ICD-10-CM | POA: Diagnosis not present

## 2021-09-08 DIAGNOSIS — I471 Supraventricular tachycardia: Secondary | ICD-10-CM

## 2021-09-08 DIAGNOSIS — F331 Major depressive disorder, recurrent, moderate: Secondary | ICD-10-CM | POA: Diagnosis not present

## 2021-09-08 DIAGNOSIS — G44319 Acute post-traumatic headache, not intractable: Secondary | ICD-10-CM | POA: Diagnosis not present

## 2021-09-08 DIAGNOSIS — Z6841 Body Mass Index (BMI) 40.0 and over, adult: Secondary | ICD-10-CM

## 2021-09-08 DIAGNOSIS — I10 Essential (primary) hypertension: Secondary | ICD-10-CM

## 2021-09-08 DIAGNOSIS — Z1231 Encounter for screening mammogram for malignant neoplasm of breast: Secondary | ICD-10-CM

## 2021-09-08 MED ORDER — VENLAFAXINE HCL ER 150 MG PO CP24
150.0000 mg | ORAL_CAPSULE | Freq: Every day | ORAL | 3 refills | Status: DC
Start: 2021-09-08 — End: 2021-11-29

## 2021-09-08 MED ORDER — METOPROLOL SUCCINATE ER 50 MG PO TB24: 25.0000 mg | ORAL_TABLET | Freq: Every day | ORAL | 3 refills | Status: DC

## 2021-09-08 MED ORDER — ALBUTEROL SULFATE HFA 108 (90 BASE) MCG/ACT IN AERS: 2.0000 | INHALATION_SPRAY | Freq: Four times a day (QID) | RESPIRATORY_TRACT | 6 refills | Status: DC | PRN

## 2021-09-08 MED ORDER — VENLAFAXINE HCL ER 225 MG PO TB24: 225.0000 mg | ORAL_TABLET | Freq: Every day | ORAL | 5 refills | Status: DC

## 2021-09-08 MED ORDER — PROCHLORPERAZINE MALEATE 5 MG PO TABS: 5.0000 mg | ORAL_TABLET | Freq: Four times a day (QID) | ORAL | 0 refills | Status: DC | PRN

## 2021-09-08 NOTE — Telephone Encounter (Signed)
Pharmacy requesting an alternative   Please review and advise.  Thanks.  Dm/cma

## 2021-09-08 NOTE — Progress Notes (Signed)
[facility] [address] [place] [address] Dept: [phone] Dept Fax: [phone]  Chronic Care Office Visit  Subjective:    Patient ID: Katherine Parker, female    DOB: [birth year], 56 y.o..   MRN: [id number]  Chief Complaint  Patient presents with   Follow-up    6 week f//u .      History of Present Illness:  Patient is in today for reassessment of chronic medical issues.  Katherine Parker continues to struggle with post-traumatic headaches. She did not have a headache issue prior to a fall she sustained on 07/02/2021 at [facility]. She had some resulting vertigo and right temporal headache afterwards. She has been seen by Dr. Pearletha Forge in the concussion clinic with Sports Medicine. She notes her headaches had been improving, but then worsened after her last visit with Dr. Pearletha Forge. He had discussed with her about increasing her Toprol dose, but she wanted to check with me first. She describes her headache now as a frontal headache occurring 2-3 times a day with associated photophobia and phonophobia. She has been using Compazine, as there is an aspect of nausea involved.  Katherine Parker has a history of hypertension. She is managed on metoprolol 25 mg daily for this and a history of SVT.  Katherine Parker has a history of mild intermittent asthma. She uses an albuterol inhaler for this. she has fears of progressive disability similar to her mother.  Katherine Parker is pending an appointment at the [facility] Weight clinic. She continues to work at her own on weight loss, again out of concern for the impact of weight on her health.  Katherine Parker has a history of depression. She is managed on venlafaxine XR 150 mg daily. She still notes "waves" of depression that she feels during the day. She wonders about going up on her dose.  Past Medical History: Patient Active Problem List   Diagnosis Date Noted   Vitiligo 07/27/2021   Obstructive sleep apnea  03/24/2021   Stutter 03/24/2021   Paroxysmal SVT (supraventricular tachycardia) (HCC) 11/18/2020   Normocytic anemia 10/20/2020   Spondylolisthesis at L5-S1 level 10/20/2020   Prediabetes 10/20/2020   Hypertrophic cardiomyopathy (HCC) 10/13/2020   Morbid obesity with BMI of 50.0-59.9, adult ([facility]CC) 10/13/2020   Essential hypertension 10/13/2020   Chronic right-sided low back pain with right-sided sciatica 10/13/2020   Borderline hyperlipidemia 03/09/2020   Insomnia 03/09/2020   Mild intermittent asthma 03/09/2020   Moderate recurrent major depression (HCC) 03/09/2020   Past Surgical History:  Procedure Laterality Date   CESAREAN SECTION     GUM SURGERY     TUBAL LIGATION     Family History  Problem Relation Age of Onset   Heart disease Mother    COPD Mother    Congestive Heart Failure Mother    Hypertension Mother    Diabetes Mother    Dementia Father    Diabetes Maternal Grandmother    Colon cancer Maternal Grandmother    Dementia Paternal Grandmother    Dementia Paternal Grandfather    Cancer Half-Sister        Breast, brain metastasis   Cancer Half-Sister        Uterine   Outpatient Medications Prior to Visit  Medication Sig Dispense Refill   acetaminophen-codeine (TYLENOL #3) 300-30 MG tablet Take 1 tablet by mouth every 4 (four) hours as needed for moderate pain. 10 tablet 0   Melatonin 10 MG TABS Take 10 mg by mouth at bedtime  as needed (sleep).     methocarbamol (ROBAXIN) 500 MG tablet Take 1 tablet (500 mg total) by mouth every 8 (eight) hours as needed for muscle spasms. 60 tablet 1   naproxen (NAPROSYN) 375 MG tablet Take 1 tablet (375 mg total) by mouth 2 (two) times daily with a meal. 60 tablet 1   ondansetron (ZOFRAN) 4 MG tablet Take 1 tablet (4 mg total) by mouth every 8 (eight) hours as needed for nausea or vomiting. 60 tablet 1   terbinafine (LAMISIL) 250 MG tablet Take 1 tablet (250 mg total) by mouth daily. 90 tablet 0   albuterol (VENTOLIN HFA) 108 (90  Base) MCG/ACT inhaler Inhale 2 puffs into the lungs every 6 (six) hours as needed for wheezing or shortness of breath.     metoprolol succinate (TOPROL XL) 25 MG 24 hr tablet Take 1 tablet (25 mg total) by mouth daily. 90 tablet 3   prochlorperazine (COMPAZINE) 5 MG tablet Take 1 tablet (5 mg total) by mouth every 6 (six) hours as needed for nausea or vomiting. 30 tablet 0   venlafaxine XR (EFFEXOR-XR) 150 MG 24 hr capsule Take 1 capsule (150 mg total) by mouth daily with breakfast. 90 capsule 3   No facility-administered medications prior to visit.   Allergies  Allergen Reactions   Penicillin G     Other reaction(s): Unknown   Penicillins     Did it involve swelling of the face/tongue/throat, SOB, or low BP? Unknown Did it involve sudden or severe rash/hives, skin peeling, or any reaction on the inside of your mouth or nose? Unknown Did you need to seek medical attention at a hospital or doctor's office? Unknown When did it last happen?  Childhood    If all above answers are "NO", may proceed with cephalosporin use.       Objective:   Today's Vitals   09/08/21 0915  BP: 130/84  Pulse: 82  Temp: 97.6 F (36.4 C)  TempSrc: Temporal  SpO2: 96%  Weight: (!) 334 lb 6.4 oz (151.7 kg)  Height: 5' 6.5" (1.689 m)   Body mass index is 53.16 kg/m.   General: Well developed, well nourished. No acute distress. Lungs: Clear to auscultation bilaterally. No wheezing, rales or rhonchi. Psych: Alert and oriented. Normal mood and affect.  Health Maintenance Due  Topic Date Due   HIV Screening  Never done   Hepatitis C Screening  Never done   MAMMOGRAM  Never done   Zoster Vaccines- Shingrix (1 of 2) Never done   INFLUENZA VACCINE  08/03/2021     Assessment & Plan:   1. Acute post-traumatic headache, not intractable Katherine Parker headaches continue to seem like the originated with her head injury. We did discuss going up on her metoprolol dose to see if this will help. I will also have  her continue on prochlorperazine. If this is not improving over the next month or so, she may need to see a neurologist for further management options.  - metoprolol succinate (TOPROL XL) 50 MG 24 hr tablet; Take 0.5 tablets (25 mg total) by mouth daily.  Dispense: 90 tablet; Refill: 3 - prochlorperazine (COMPAZINE) 5 MG tablet; Take 1 tablet (5 mg total) by mouth every 6 (six) hours as needed for nausea or vomiting.  Dispense: 30 tablet; Refill: 0  2. Essential hypertension Blood pressure is adequately controled today. However, if this is not getting to goal, we might consider adding a second agent to this.  3. Mild intermittent  asthma without complication Lungs are clear. I will renew her albuterol.  - albuterol (VENTOLIN HFA) 108 (90 Base) MCG/ACT inhaler; Inhale 2 puffs into the lungs every 6 (six) hours as needed for wheezing or shortness of breath.  Dispense: 6.7 g; Refill: 6  4. Moderate recurrent major depression (HCC) We discussed options to potentially increase her venlafaxine or add a 2nd agent (such as bupropion). Katherine Parker prefers to stay with venlafaxine for now.  - Venlafaxine HCl 225 MG TB24; Take 1 tablet (225 mg total) by mouth daily.  Dispense: 30 tablet; Refill: 5  5. Morbid obesity with BMI of 50.0-59.9, adult (HCC) Maximum weight: 370 lbs (02/2020) Current weight: 334 lbs Weight change since last visit: - 6 lbs.  Katherine Parker continues to make progress in lowering her weight. She feels the venlafaxine has helped with this. I encourage her to be as active as she can.  6. Paroxysmal SVT (supraventricular tachycardia) (HCC) Stable.  - metoprolol succinate (TOPROL XL) 50 MG 24 hr tablet; Take 0.5 tablets (25 mg total) by mouth daily.  Dispense: 90 tablet; Refill: 3  7. Encounter for screening mammogram for malignant neoplasm of breast  - MM DIGITAL SCREENING BILATERAL; Future  Return in about 6 weeks (around 10/20/2021) for Reassessment.   Loyola Mast, MD

## 2021-09-15 ENCOUNTER — Encounter: Payer: Self-pay | Admitting: Family Medicine

## 2021-09-15 ENCOUNTER — Ambulatory Visit (INDEPENDENT_AMBULATORY_CARE_PROVIDER_SITE_OTHER): Payer: Medicare Other | Admitting: Family Medicine

## 2021-09-15 VITALS — BP 142/84 | Ht 66.5 in

## 2021-09-15 DIAGNOSIS — S060X0D Concussion without loss of consciousness, subsequent encounter: Secondary | ICD-10-CM | POA: Diagnosis not present

## 2021-09-15 DIAGNOSIS — G44319 Acute post-traumatic headache, not intractable: Secondary | ICD-10-CM | POA: Diagnosis not present

## 2021-09-15 NOTE — Patient Instructions (Signed)
You're making good progress. Continue the toprol as you have been. We will refer you to the neurologist to discuss other options for the post-traumatic headaches. Continue home exercises from vestibular therapy 3-4 times a week. Follow up with me in about 2 months.

## 2021-09-15 NOTE — Progress Notes (Signed)
PCP: Loyola Mast, MD  Subjective:   HPI: Patient is a 56 y.o. female here for concussion.  6/28: Slipped and fell 2 days ago at [facility] and struck her head on the floor, no loss of consciousness.  Reported to the ED after the event and had head CT, cervical CT, thoracic and lumbar spine CT which were all unremarkable for acute abnormalities or fractures.  Lumbar spine did demonstrate anterolisthesis, likely chronic and not acute.  She was diagnosed with a concussion and sent home with narcotics, muscle relaxer, and naproxen to help with muscle aches and headaches.  Today she reports that she continues with headaches averaging about 4 times per day and positive for phonophobia and photophobia with intermittent nausea.  Resting in a dark room with minimal to no sounds helps her headaches.  Muscle aches have worsened since her fall 2 days ago.  Mental and physical activity worsen her symptoms.  No episodes of emesis but does report continued intermittent nausea.  No prior history of concussion.  No focal neurological deficits, acute vision changes, syncope or presyncope, or recent repeat falls since the initial event.   7/17: Patient reports she is improved compared to last visit. Frontal headache her main complaint - comes on without any specific trigger. Taking tylenol for this. Also with nausea and dizziness. SCAT symptoms 22/22 with severity 76/132, improved.  8/14: Reports ~20% improvement in symptoms from last visit Frontal and occipital headache remains, intermittently. Having nausea a couple times a week, would like a refill on Zofran Having memory issues as well Also has stress regarding mother now in palliative care SCAT symptoms 22/22 with severity 83/132, worsened.  9/13: Patient reports she's overall improved. Just recently went up on toprol. Doing well with vestibular therapy. Taking zofran about once weekly. Headaches on right side main issue now. SCAT symptoms  22/22 with severity 55/132.  Past Medical History:  Diagnosis Date   Anxiety    CHF (congestive heart failure) (HCC)    Hypertension    SVT (supraventricular tachycardia) (HCC)     Current Outpatient Medications on File Prior to Visit  Medication Sig Dispense Refill   acetaminophen-codeine (TYLENOL #3) 300-30 MG tablet Take 1 tablet by mouth every 4 (four) hours as needed for moderate pain. 10 tablet 0   albuterol (VENTOLIN HFA) 108 (90 Base) MCG/ACT inhaler Inhale 2 puffs into the lungs every 6 (six) hours as needed for wheezing or shortness of breath. 6.7 g 6   Melatonin 10 MG TABS Take 10 mg by mouth at bedtime as needed (sleep).     methocarbamol (ROBAXIN) 500 MG tablet Take 1 tablet (500 mg total) by mouth every 8 (eight) hours as needed for muscle spasms. 60 tablet 1   metoprolol succinate (TOPROL XL) 50 MG 24 hr tablet Take 0.5 tablets (25 mg total) by mouth daily. 90 tablet 3   naproxen (NAPROSYN) 375 MG tablet Take 1 tablet (375 mg total) by mouth 2 (two) times daily with a meal. 60 tablet 1   ondansetron (ZOFRAN) 4 MG tablet Take 1 tablet (4 mg total) by mouth every 8 (eight) hours as needed for nausea or vomiting. 60 tablet 1   prochlorperazine (COMPAZINE) 5 MG tablet Take 1 tablet (5 mg total) by mouth every 6 (six) hours as needed for nausea or vomiting. 30 tablet 0   terbinafine (LAMISIL) 250 MG tablet Take 1 tablet (250 mg total) by mouth daily. 90 tablet 0   venlafaxine XR (EFFEXOR XR) 75  MG 24 hr capsule Take 1 capsule (75 mg total) by mouth daily with breakfast. Take togther with 150 mg dose (total dose= 225 mg) 90 capsule 3   venlafaxine XR (EFFEXOR-XR) 150 MG 24 hr capsule Take 1 capsule (150 mg total) by mouth daily with breakfast. Take together with 75 mg dose (total dose= 225 mg) 90 capsule 3   No current facility-administered medications on file prior to visit.    Past Surgical History:  Procedure Laterality Date   CESAREAN SECTION     GUM SURGERY     TUBAL  LIGATION      Allergies  Allergen Reactions   Penicillin G     Other reaction(s): Unknown   Penicillins     Did it involve swelling of the face/tongue/throat, SOB, or low BP? Unknown Did it involve sudden or severe rash/hives, skin peeling, or any reaction on the inside of your mouth or nose? Unknown Did you need to seek medical attention at a hospital or doctor's office? Unknown When did it last happen?  Childhood    If all above answers are "NO", may proceed with cephalosporin use.      BP (!) 142/84   Ht 5' 6.5" (1.689 m)   BMI 53.16 kg/m       No data to display              No data to display              Objective:  Physical Exam: Gen: NAD, comfortable in exam room  Neuro: Alert, oriented 4/5 (date off by 1) Immediate memory 14/15 Concentration 3/5 Neck FROM with minimal tenderness bilateral trapezius. Finger to nose normal bilaterally. Balance 0 errors double leg, tandem stance.  Difficulty performing single leg. Delayed recall 5/5 Horizontal saccades 20 trials without nystagmus but felt dizzy.   Assessment & Plan:  Postconcussion syndrome - 2/2 fall 6/26.  Clinically improving though again symptom score is high.  Testing today much improved, close to baseline I suspect.  Continue home vestibular exercises.  Continue toprol at the higher dose.  Will refer to neurology for additional treatment for her post-traumatic headaches. Zofran as needed for nausea.  F/u in 2 months.

## 2021-09-27 ENCOUNTER — Ambulatory Visit (INDEPENDENT_AMBULATORY_CARE_PROVIDER_SITE_OTHER): Payer: Medicare Other

## 2021-09-27 ENCOUNTER — Encounter: Payer: Self-pay | Admitting: Family Medicine

## 2021-09-27 DIAGNOSIS — Z23 Encounter for immunization: Secondary | ICD-10-CM

## 2021-09-27 NOTE — Progress Notes (Signed)
Per orders of Dr [name], injection of Influenza given by Tannon Peerson, cma.  Patient tolerated injection well.   

## 2021-10-14 ENCOUNTER — Ambulatory Visit: Payer: Medicare Other | Admitting: Family Medicine

## 2021-10-14 ENCOUNTER — Telehealth: Payer: Self-pay | Admitting: Family Medicine

## 2021-10-14 NOTE — Telephone Encounter (Signed)
Pt was a no show 10/12 for an OV with Dr. Gena Fray. This was her first, letter has been sent

## 2021-10-20 NOTE — Telephone Encounter (Signed)
1st no show, fee waived, letter sent 

## 2021-11-03 ENCOUNTER — Inpatient Hospital Stay: Admission: RE | Admit: 2021-11-03 | Payer: Medicare Other | Source: Ambulatory Visit

## 2021-11-15 ENCOUNTER — Encounter: Payer: Self-pay | Admitting: Family Medicine

## 2021-11-15 ENCOUNTER — Ambulatory Visit: Payer: Medicare Other | Admitting: Family Medicine

## 2021-11-15 VITALS — BP 96/64 | Ht 66.5 in | Wt 325.0 lb

## 2021-11-15 DIAGNOSIS — F0781 Postconcussional syndrome: Secondary | ICD-10-CM | POA: Diagnosis not present

## 2021-11-15 NOTE — Patient Instructions (Signed)
You have postconcussion syndrome. We will refer you to neurology for additional workup and treatment. Continue the metoprolol, effexor with the compazine as needed for nausea. We will refer you for CBT as well with psychology. Follow up with me in 2 months.

## 2021-11-15 NOTE — Progress Notes (Signed)
PCP: Loyola Mast, MD  Subjective:   HPI: Patient is a 56 y.o. female here for concussion.  6/28: Slipped and fell 2 days ago at [facility] and struck her head on the floor, no loss of consciousness.  Reported to the ED after the event and had head CT, cervical CT, thoracic and lumbar spine CT which were all unremarkable for acute abnormalities or fractures.  Lumbar spine did demonstrate anterolisthesis, likely chronic and not acute.  She was diagnosed with a concussion and sent home with narcotics, muscle relaxer, and naproxen to help with muscle aches and headaches.  Today she reports that she continues with headaches averaging about 4 times per day and positive for phonophobia and photophobia with intermittent nausea.  Resting in a dark room with minimal to no sounds helps her headaches.  Muscle aches have worsened since her fall 2 days ago.  Mental and physical activity worsen her symptoms.  No episodes of emesis but does report continued intermittent nausea.  No prior history of concussion.  No focal neurological deficits, acute vision changes, syncope or presyncope, or recent repeat falls since the initial event.   7/17: Patient reports she is improved compared to last visit. Frontal headache her main complaint - comes on without any specific trigger. Taking tylenol for this. Also with nausea and dizziness. SCAT symptoms 22/22 with severity 76/132, improved.  8/14: Reports ~20% improvement in symptoms from last visit Frontal and occipital headache remains, intermittently. Having nausea a couple times a week, would like a refill on Zofran Having memory issues as well Also has stress regarding mother now in palliative care SCAT symptoms 22/22 with severity 83/132, worsened.  9/13: Patient reports she's overall improved. Just recently went up on toprol. Doing well with vestibular therapy. Taking zofran about once weekly. Headaches on right side main issue now. SCAT symptoms  22/22 with severity 55/132.  11/13: Patient returns with worsening symptoms compared to last visit. Worse headaches, photophobia, phonophobia. She has nausea as well, takes compazine as needed. Larey Seat twice recently due to dizziness as well. Taking metoprolol. SCAT symptoms 22/22 with severity  96/132  Past Medical History:  Diagnosis Date   Anxiety    CHF (congestive heart failure) (HCC)    Hypertension    SVT (supraventricular tachycardia)     Current Outpatient Medications on File Prior to Visit  Medication Sig Dispense Refill   acetaminophen-codeine (TYLENOL #3) 300-30 MG tablet Take 1 tablet by mouth every 4 (four) hours as needed for moderate pain. 10 tablet 0   albuterol (VENTOLIN HFA) 108 (90 Base) MCG/ACT inhaler Inhale 2 puffs into the lungs every 6 (six) hours as needed for wheezing or shortness of breath. 6.7 g 6   Melatonin 10 MG TABS Take 10 mg by mouth at bedtime as needed (sleep).     methocarbamol (ROBAXIN) 500 MG tablet Take 1 tablet (500 mg total) by mouth every 8 (eight) hours as needed for muscle spasms. 60 tablet 1   metoprolol succinate (TOPROL XL) 50 MG 24 hr tablet Take 0.5 tablets (25 mg total) by mouth daily. 90 tablet 3   naproxen (NAPROSYN) 375 MG tablet Take 1 tablet (375 mg total) by mouth 2 (two) times daily with a meal. 60 tablet 1   ondansetron (ZOFRAN) 4 MG tablet Take 1 tablet (4 mg total) by mouth every 8 (eight) hours as needed for nausea or vomiting. 60 tablet 1   prochlorperazine (COMPAZINE) 5 MG tablet Take 1 tablet (5 mg total) by  mouth every 6 (six) hours as needed for nausea or vomiting. 30 tablet 0   terbinafine (LAMISIL) 250 MG tablet Take 1 tablet (250 mg total) by mouth daily. 90 tablet 0   venlafaxine XR (EFFEXOR XR) 75 MG 24 hr capsule Take 1 capsule (75 mg total) by mouth daily with breakfast. Take togther with 150 mg dose (total dose= 225 mg) 90 capsule 3   venlafaxine XR (EFFEXOR-XR) 150 MG 24 hr capsule Take 1 capsule (150 mg total) by  mouth daily with breakfast. Take together with 75 mg dose (total dose= 225 mg) 90 capsule 3   No current facility-administered medications on file prior to visit.    Past Surgical History:  Procedure Laterality Date   CESAREAN SECTION     GUM SURGERY     TUBAL LIGATION      Allergies  Allergen Reactions   Penicillin G     Other reaction(s): Unknown   Penicillins     Did it involve swelling of the face/tongue/throat, SOB, or low BP? Unknown Did it involve sudden or severe rash/hives, skin peeling, or any reaction on the inside of your mouth or nose? Unknown Did you need to seek medical attention at a hospital or doctor's office? Unknown When did it last happen?  Childhood    If all above answers are "NO", may proceed with cephalosporin use.      BP 96/64   Ht 5' 6.5" (1.689 m)   Wt (!) 325 lb (147.4 kg)   BMI 51.67 kg/m       No data to display              No data to display              Objective:  Physical Exam: Gen: NAD, comfortable in exam room  Neuro: Alert, oriented 5/5 Unable to performed horizontal saccades due to dizziness.   Assessment & Plan:  Postconcussion syndrome - 2/2 fall 6/26.  Full testing at last visit with improvement and felt to be near baseline but with post-traumatic headaches.  Current symptoms complicated by recent loss of her mom a week ago.  She would benefit from seeing psychology for CBT, grief - referral made.  Referral made to neurology last visit for post-traumatic headaches, will place referral again to [facility] - has been seen there previously.  She has done vestibular therapy and home exercises.  Continue metoprolol (BP on low side so will not increase).  Continue effexor.  Compazine as needed for nausea.  F/u with [place] in 2 months.

## 2021-11-16 ENCOUNTER — Emergency Department (HOSPITAL_COMMUNITY): Payer: Medicare Other

## 2021-11-16 ENCOUNTER — Emergency Department (HOSPITAL_BASED_OUTPATIENT_CLINIC_OR_DEPARTMENT_OTHER): Admit: 2021-11-16 | Discharge: 2021-11-16 | Disposition: A | Discharge status: Home / Self Care | Payer: Medicare Other

## 2021-11-16 ENCOUNTER — Ambulatory Visit: Payer: Medicare Other | Admitting: Podiatry

## 2021-11-16 ENCOUNTER — Emergency Department (HOSPITAL_COMMUNITY)
Admission: EM | Admit: 2021-11-16 | Discharge: 2021-11-16 | Disposition: A | Discharge status: Home / Self Care | Payer: Medicare Other | Attending: Emergency Medicine | Admitting: Emergency Medicine

## 2021-11-16 ENCOUNTER — Encounter (HOSPITAL_COMMUNITY): Payer: Self-pay

## 2021-11-16 DIAGNOSIS — Z20822 Contact with and (suspected) exposure to covid-19: Secondary | ICD-10-CM | POA: Diagnosis not present

## 2021-11-16 DIAGNOSIS — R079 Chest pain, unspecified: Secondary | ICD-10-CM | POA: Diagnosis present

## 2021-11-16 DIAGNOSIS — Z79899 Other long term (current) drug therapy: Secondary | ICD-10-CM | POA: Diagnosis not present

## 2021-11-16 DIAGNOSIS — I110 Hypertensive heart disease with heart failure: Secondary | ICD-10-CM | POA: Insufficient documentation

## 2021-11-16 DIAGNOSIS — I509 Heart failure, unspecified: Secondary | ICD-10-CM | POA: Diagnosis not present

## 2021-11-16 DIAGNOSIS — J069 Acute upper respiratory infection, unspecified: Secondary | ICD-10-CM | POA: Diagnosis not present

## 2021-11-16 DIAGNOSIS — Z7951 Long term (current) use of inhaled steroids: Secondary | ICD-10-CM | POA: Insufficient documentation

## 2021-11-16 DIAGNOSIS — M7989 Other specified soft tissue disorders: Secondary | ICD-10-CM | POA: Diagnosis not present

## 2021-11-16 DIAGNOSIS — J45909 Unspecified asthma, uncomplicated: Secondary | ICD-10-CM | POA: Diagnosis not present

## 2021-11-16 DIAGNOSIS — R071 Chest pain on breathing: Secondary | ICD-10-CM

## 2021-11-16 LAB — CBC
HCT: 36.3 % (ref 36.0–46.0)
Hemoglobin: 11.3 g/dL — ABNORMAL LOW (ref 12.0–15.0)
MCH: 27.8 pg (ref 26.0–34.0)
MCHC: 31.1 g/dL (ref 30.0–36.0)
MCV: 89.2 fL (ref 80.0–100.0)
Platelets: 245 10*3/uL (ref 150–400)
RBC: 4.07 MIL/uL (ref 3.87–5.11)
RDW: 14 % (ref 11.5–15.5)
WBC: 4.9 10*3/uL (ref 4.0–10.5)
nRBC: 0 % (ref 0.0–0.2)

## 2021-11-16 LAB — BASIC METABOLIC PANEL
Anion gap: 6 (ref 5–15)
BUN: 8 mg/dL (ref 6–20)
CO2: 29 mmol/L (ref 22–32)
Calcium: 8.9 mg/dL (ref 8.9–10.3)
Chloride: 105 mmol/L (ref 98–111)
Creatinine, Ser: 0.52 mg/dL (ref 0.44–1.00)
GFR, Estimated: >60 mL/min (ref >60)
Glucose, Bld: 92 mg/dL (ref 70–99)
Potassium: 3.5 mmol/L (ref 3.5–5.1)
Sodium: 140 mmol/L (ref 135–145)

## 2021-11-16 LAB — RESP PANEL BY RT-PCR (FLU A&B, COVID) ARPGX2
Influenza A by PCR: NEGATIVE
Influenza B by PCR: NEGATIVE
SARS Coronavirus 2 by RT PCR: NEGATIVE

## 2021-11-16 LAB — TROPONIN I (HIGH SENSITIVITY): Troponin I (High Sensitivity): 6 ng/L (ref <18)

## 2021-11-16 LAB — BRAIN NATRIURETIC PEPTIDE: B Natriuretic Peptide: 20.3 pg/mL (ref 0.0–100.0)

## 2021-11-16 MED ORDER — ACETAMINOPHEN 325 MG PO TABS
650.0000 mg | ORAL_TABLET | Freq: Once | ORAL | Status: AC
  Administered 2021-11-16: 650 mg via ORAL
  Filled 2021-11-16: qty 2

## 2021-11-16 MED ORDER — BENZONATATE 100 MG PO CAPS: 200.0000 mg | ORAL_CAPSULE | Freq: Three times a day (TID) | ORAL | 0 refills | Status: AC | PRN

## 2021-11-16 NOTE — ED Triage Notes (Signed)
Pt arrived via POV, c/o sternal chest pain. Also c/o SOB, n/v, diarrhea, fevers/chills. States mother recently had covid.

## 2021-11-16 NOTE — Discharge Instructions (Signed)
It was a pleasure taking care of you today. Please follow-up with your PCP within the next week for reassessment. For any worsening symptoms of shortness of breath, chest pain, or other concerns, please return to nearest ER immediately. You have been prescribed a medication to help with your cough so you can sleep better at night. If unable to attain this medication, feel free to use over the counter cough medications for assistance.

## 2021-11-16 NOTE — Progress Notes (Signed)
Bilateral lower extremity venous duplex has been completed. Preliminary results can be found in CV Proc through chart review.  Results were given to Ysidro Evert PA.  11/16/21 3:30 PM Olen Cordial RVT

## 2021-11-16 NOTE — ED Provider Notes (Signed)
[facility]-EMERGENCY DEPT Provider Note   CSN: [id number] Arrival date & time: 11/16/21  0846     History  Chief Complaint  Patient presents with   Chest Pain    Katherine Parker is a 56 y.o. female with PMH HTN, HLD, Heart Failure with last EF 58% in 2022, mild asthma, who presents to [facility] ED for cough, dyspnea, subjective fever, sore throat, and chest pain worsening for the last 3 days. Cough, sore throat, and headaches started a week ago before symptoms worsened 3 days ago ago. At first, her chest pain was only when she coughed but now is constant, rated 8/10, and described as left chest pressure radiating to bilateral shoulders. She has had 3 episodes of post-tussive emesis in the last 3 days. She identifies her lower extremities appear asymmetrical and this is abnormal for her, though she has not felt they are exceedingly swollen. She states she was diagnosed with Heart Failure over a year ago and saw a cardiologist one time but otherwise has been unable to follow-up due to mother's deteriorating health and recent COVID pneumonia of which unfortunately caused her death approximately a week ago. Pt does not smoke but does have exposure to smoke by her daughter and previous housemates. She reports headaches as well but these have been occurring since a head injury in June 2023 for which she was evaluated.   The history is provided by the patient and a relative.  Chest Pain Pain location:  L chest Pain quality: pressure   Pain radiates to:  L shoulder and R shoulder Pain severity now: 8/10. Onset quality:  Gradual Duration:  3 days Timing:  Constant Progression:  Waxing and waning Chronicity:  New Context: breathing   Relieved by:  Nothing Worsened by:  Coughing and deep breathing Ineffective treatments:  None tried Associated symptoms: cough, fever, headache, lower extremity edema, nausea, palpitations, shortness of breath and vomiting   Associated  symptoms: no abdominal pain, no back pain, no dysphagia and no fatigue   Cough:    Cough characteristics:  Hacking, vomit-inducing and productive   Sputum characteristics:  Clear   Severity:  Severe   Onset quality:  Gradual   Duration:  1 week   Timing:  Constant   Progression:  Unchanged   Chronicity:  New Fever:    Duration:  3 days   Timing:  Intermittent   Temp source:  Subjective   Progression:  Waxing and waning Headaches:    Severity:  Mild   Timing:  Intermittent   Progression:  Waxing and waning   Chronicity:  Chronic Nausea:    Severity:  Mild   Duration:  3 days   Timing:  Intermittent   Progression:  Waxing and waning Shortness of breath:    Severity:  Moderate   Onset quality:  Gradual   Duration:  3 days   Timing:  Intermittent   Progression:  Waxing and waning Risk factors: high cholesterol, hypertension and obesity        Home Medications Prior to Admission medications   Medication Sig Start Date End Date Taking? Authorizing Provider  benzonatate (TESSALON) 100 MG capsule Take 2 capsules (200 mg total) by mouth 3 (three) times daily as needed for cough (take nightly, may increase to every 8 hours up to 3 times a day as needed). 11/16/21 12/16/21 Yes Laketia Vicknair L, PA-C  acetaminophen-codeine (TYLENOL #3) 300-30 MG tablet Take 1 tablet by mouth every 4 (four) hours as needed  for moderate pain. 07/02/21   Loyola Mast, MD  albuterol (VENTOLIN HFA) 108 (90 Base) MCG/ACT inhaler Inhale 2 puffs into the lungs every 6 (six) hours as needed for wheezing or shortness of breath. 09/08/21   Loyola Mast, MD  Melatonin 10 MG TABS Take 10 mg by mouth at bedtime as needed (sleep).    [provider]  metoprolol succinate (TOPROL XL) 50 MG 24 hr tablet Take 0.5 tablets (25 mg total) by mouth daily. 09/08/21   Loyola Mast, MD  ondansetron (ZOFRAN) 4 MG tablet Take 1 tablet (4 mg total) by mouth every 8 (eight) hours as needed for nausea or vomiting.  08/16/21   Hudnall, Azucena Fallen, MD  prochlorperazine (COMPAZINE) 5 MG tablet Take 1 tablet (5 mg total) by mouth every 6 (six) hours as needed for nausea or vomiting. 09/08/21   Loyola Mast, MD  terbinafine (LAMISIL) 250 MG tablet Take 1 tablet (250 mg total) by mouth daily. 08/13/21   Delories Heinz, DPM  venlafaxine XR (EFFEXOR XR) 75 MG 24 hr capsule Take 1 capsule (75 mg total) by mouth daily with breakfast. Take togther with 150 mg dose (total dose= 225 mg) 09/08/21   Loyola Mast, MD  venlafaxine XR (EFFEXOR-XR) 150 MG 24 hr capsule Take 1 capsule (150 mg total) by mouth daily with breakfast. Take together with 75 mg dose (total dose= 225 mg) 09/08/21   Loyola Mast, MD      Allergies    Penicillin g and Penicillins    Review of Systems   Review of Systems  Constitutional:  Positive for appetite change, chills and fever. Negative for activity change and fatigue.  HENT:  Positive for sore throat and voice change. Negative for trouble swallowing.   Respiratory:  Positive for cough and shortness of breath.   Cardiovascular:  Positive for chest pain, palpitations and leg swelling.  Gastrointestinal:  Positive for nausea and vomiting. Negative for abdominal pain and diarrhea.  Genitourinary:  Negative for flank pain.  Musculoskeletal:  Negative for back pain.  Skin:  Negative for pallor, rash and wound.  Neurological:  Positive for headaches.  Psychiatric/Behavioral:  Negative for behavioral problems and confusion.     Physical Exam Updated Vital Signs BP (!) 148/82   Pulse 62   Temp 98.5 F (36.9 C) (Oral)   Resp 16   Ht 5' 6.5" (1.689 m)   Wt (!) 145.2 kg   SpO2 97%   BMI 50.88 kg/m  Physical Exam Vitals and nursing note reviewed.  Constitutional:      General: She is not in acute distress.    Appearance: She is well-developed. She is obese.  HENT:     Head: Normocephalic and atraumatic.  Eyes:     Extraocular Movements: Extraocular movements intact.      Conjunctiva/sclera: Conjunctivae normal.  Cardiovascular:     Rate and Rhythm: Normal rate and regular rhythm.     Heart sounds: No murmur heard.    No systolic murmur is present.     No diastolic murmur is present.     No gallop. No S3 or S4 sounds.  Pulmonary:     Effort: Tachypnea (mild, short shallow breaths with frequent dry cough) present. No respiratory distress.     Breath sounds: Examination of the left-upper field reveals rales. Examination of the right-lower field reveals wheezing. Examination of the left-lower field reveals wheezing. Wheezing (mild scattered that clear with coughing) and rales present.  Chest:     Chest wall: No deformity or tenderness.  Abdominal:     Palpations: Abdomen is soft.     Tenderness: There is no abdominal tenderness. There is no guarding.  Musculoskeletal:        General: No swelling.     Cervical back: Neck supple.     Right lower leg: Tenderness (calf) present. Edema (asymmetry to lower leg when compared to left, trace pitting) present.     Left lower leg: Tenderness (mild tenderness to anterior ankle) present. No edema.  Skin:    General: Skin is warm and dry.     Capillary Refill: Capillary refill takes less than 2 seconds.  Neurological:     General: No focal deficit present.     Mental Status: She is alert and oriented to person, place, and time.  Psychiatric:        Mood and Affect: Mood is anxious.        Behavior: Behavior normal. Agitated: mildly with discussion of mother's recent death.     ED Results / Procedures / Treatments   Labs (all labs ordered are listed, but only abnormal results are displayed) Labs Reviewed  CBC - Abnormal; Notable for the following components:      Result Value   Hemoglobin 11.3 (*)    All other components within normal limits  RESP PANEL BY RT-PCR (FLU A&B, COVID) [id number]  BASIC METABOLIC PANEL  BRAIN NATRIURETIC PEPTIDE  TROPONIN I (HIGH SENSITIVITY)    EKG EKG  Interpretation  Date/Time:  Tuesday November 16 2021 08:57:19 EST Ventricular Rate:  89 PR Interval:  186 QRS Duration: 90 QT Interval:  365 QTC Calculation: 445 R Axis:   43 Text Interpretation: Sinus rhythm Confirmed by Kristine RoyalMessick, Peter [phone]([id number]) on 11/16/2021 1:19:24 PM  Radiology VAS US LOWER EXTREMITY VENOUS (DVT) (7a-7p)  Result Date: 11/16/2021  Lower Venous DVT Study Patient Name:  Katherine JeanONYA Sherburne  Date of Exam:   11/16/2021 Medical Rec #: [id number]    Accession #:    [id number] Date of Birth: 12-05-1965   Patient Gender: F Patient Age:   [age] years [facility] Procedure:      VAS US LOWER EXTREMITY VENOUS (DVT) [facility] --------------------------------------------------------------------------------  Indications: Swelling.  Risk Factors: None identified. Limitations: Body habitus and poor ultrasound/tissue interface. Comparison Study: No prior studies. Performing Technologist: Chanda BusingGregory Collins RVT  Examination Guidelines: A complete evaluation includes B-mode imaging, spectral Doppler, color Doppler, and power Doppler as needed of all accessible portions of each vessel. Bilateral testing is considered an integral part of a complete examination. Limited examinations for reoccurring indications may be performed as noted. The reflux portion of the exam is performed with the patient in reverse Trendelenburg.  +---------+---------------+---------+-----------+----------+--------------+ RIGHT    CompressibilityPhasicitySpontaneityPropertiesThrombus Aging +---------+---------------+---------+-----------+----------+--------------+ CFV      Full           Yes      Yes                                 +---------+---------------+---------+-----------+----------+--------------+ SFJ      Full                                                        +---------+---------------+---------+-----------+----------+--------------+  FV Prox  Full                                                         +---------+---------------+---------+-----------+----------+--------------+ FV Mid   Full                                                        +---------+---------------+---------+-----------+----------+--------------+ FV Distal               Yes      Yes                                 +---------+---------------+---------+-----------+----------+--------------+ PFV      Full                                                        +---------+---------------+---------+-----------+----------+--------------+ POP      Full           Yes      Yes                                 +---------+---------------+---------+-----------+----------+--------------+ PTV      Full                                                        +---------+---------------+---------+-----------+----------+--------------+ PERO     Full                                                        +---------+---------------+---------+-----------+----------+--------------+   +---------+---------------+---------+-----------+----------+--------------+ LEFT     CompressibilityPhasicitySpontaneityPropertiesThrombus Aging +---------+---------------+---------+-----------+----------+--------------+ CFV      Full           Yes      Yes                                 +---------+---------------+---------+-----------+----------+--------------+ SFJ      Full                                                        +---------+---------------+---------+-----------+----------+--------------+ FV Prox  Full                                                        +---------+---------------+---------+-----------+----------+--------------+  FV Mid                  Yes      Yes                                 +---------+---------------+---------+-----------+----------+--------------+ FV Distal               Yes      Yes                                  +---------+---------------+---------+-----------+----------+--------------+ PFV      Full                                                        +---------+---------------+---------+-----------+----------+--------------+ POP      Full           Yes      Yes                                 +---------+---------------+---------+-----------+----------+--------------+ PTV      Full                                                        +---------+---------------+---------+-----------+----------+--------------+ PERO     Full                                                        +---------+---------------+---------+-----------+----------+--------------+    Summary: RIGHT: - There is no evidence of deep vein thrombosis in the lower extremity. However, portions of this examination were limited- see technologist comments above.  - No cystic structure found in the popliteal fossa.  LEFT: - There is no evidence of deep vein thrombosis in the lower extremity. However, portions of this examination were limited- see technologist comments above.  - No cystic structure found in the popliteal fossa.  *See table(s) above for measurements and observations.    Preliminary    DG Chest 2 View  Result Date: 11/16/2021 CLINICAL DATA:  Chest pain and shortness of breath EXAM: CHEST - 2 VIEW COMPARISON:  11/05/2020 FINDINGS: Heart size upper limits of normal. Mediastinal shadows are normal. No edema. The lungs are clear. No effusions. No abnormal bone finding. IMPRESSION: No active disease. Heart upper limits of normal. Electronically Signed   By: Paulina Fusi M.D.   On: 11/16/2021 09:54    Procedures .1-3 Lead EKG Interpretation  Performed by: Tonette Lederer, PA-C Authorized by: Tonette Lederer, PA-C     Interpretation: normal     ECG rate:  88   ECG rate assessment: normal     Rhythm: sinus rhythm     Ectopy: none     Conduction: normal       Medications Ordered in ED Medications   acetaminophen (TYLENOL) tablet 650 mg (  650 mg Oral Given 11/16/21 1346)    ED Course/ Medical Decision Making/ A&P                           Medical Decision Making Amount and/or Complexity of Data Reviewed Labs: ordered. Radiology: ordered.  Risk OTC drugs. Prescription drug management.   This patient presents to the ED for concern of multiple complaints including chest pain, dyspnea, cough, this involves an extensive number of treatment options, and is a complaint that carries with it a high risk of complications and morbidity.  The differential diagnosis includes pulmonary embolism, DVT, pneumonia, heart failure exacerbation, COPD, NSTEMI, STEMI, arrhythmia, COVID-19, influenza, or other viral pathologies.   HEART Score for Major Cardiac Events from [email or web address]  on 11/16/2021  RESULT SUMMARY: 3 points Low Score (0-3 points)  Risk of MACE of 0.9-1.7%.   INPUTS: History --> 0 = Slightly suspicious EKG --> 0 = Normal Age --> 1 = 45-64 Risk factors --> 2 = ?3 risk factors or history of atherosclerotic disease Initial troponin --> 0 = ?normal limit  Wells' Criteria for Pulmonary Embolism from [email or web address]  on 11/16/2021 ** All calculations should be rechecked by clinician prior to use **  RESULT SUMMARY: 3.0 points Moderate risk group: 16.2% chance of PE in an ED population.   Another study assigned scores ? 4 as "PE Unlikely" and had a 3% incidence of PE.   INPUTS: Clinical signs and symptoms of DVT --> 3 = Yes PE is #1 diagnosis OR equally likely --> 0 = No Heart rate > 100 --> 0 = No Immobilization at least 3 days OR surgery in the previous 4 weeks --> 0 = No Previous, objectively diagnosed PE or DVT --> 0 = No Hemoptysis --> 0 = No Malignancy w/ treatment within 6 months or palliative --> 0 = No   Co morbidities that complicate the patient evaluation  COVID-19 exposure, obesity, HTN, HLD, Heart Failure, SVT, asthma, OSA   Additional history  obtained:  Additional history obtained from daughter   Lab Tests:  I Ordered, and personally interpreted labs.  The pertinent results include:  negative metabolic panel, normal troponin and BNP, CBC unremarkable apart from mildly decreased hemoglobin, negative respiratory panel, negative Doppler US.    Imaging Studies ordered:  I ordered imaging studies including CXR and doppler US of lower extremities  I independently visualized and interpreted imaging which showed no acute findings I agree with the radiologist interpretation   Cardiac Monitoring: / EKG:  The patient was maintained on a cardiac monitor.  I personally viewed and interpreted the cardiac monitored which showed an underlying rhythm of: normal sinus rhythm with no ST segment changes.  Problem List / ED Course / Critical interventions / Medication management  Katherine Parker is a 56 year old female with significant medical history including HTN, HLD, pre-DM, heart failure, OSA, SVT, obesity, family history CAD, who presented to ED with concern for recent onset of chest pain and shortness of breath following likely viral illness that started a week ago along with the death of her mother from COVID-19 pneumonia. Pt concerned for exposing further family members to COVID-19 and would like tested and further evaluated. Lab work unremarkable including troponin, BNP, metabolic panel, CBC. On exam, there was acute asymmetry to lower extremities with RLE appearing larger than LLE and LLE having tenderness to anterior aspect. Due to this and pt's acute SOB, proceeded with doppler US to rule out  DVT which was normal. Reassured by pt's [place], BNP, clear CXR, EKG, normal oxygen saturations, heart score, and lung exam that improves with coughing. Chest pain suspected to be chest wall tenderness with significant improvement with Tylenol. Pt encouraged to set up routine follow-up with cardiologist to better manage her comorbidities and follow-up with her  PCP to reassess condition. Ambulating well on final exam with less frequent cough and resolved dyspnea. Given Tessalon capsules to help with cough that is preventing her from sleeping at night, advised she may also take these up to 3 times daily if needed. Strict return precautions given for worsening symptoms including chest pain and SOB.  I ordered medication including Tylenol for pain in chest  Reevaluation of the patient after these medicines showed that the patient improved I have reviewed the patients home medicines and have made adjustments as needed   Social Determinants of Health:  Recent life stressor due to death of mother         Final Clinical Impression(s) / ED Diagnoses Final diagnoses:  Viral upper respiratory tract infection  Chest pain on breathing    Rx / DC Orders ED Discharge Orders          Ordered    benzonatate (TESSALON) 100 MG capsule  3 times daily PRN        11/16/21 1528              Tonette Lederer, PA-C 11/16/21 1623    Wynetta Fines, MD 11/17/21 [phone]

## 2021-11-17 ENCOUNTER — Telehealth: Payer: Self-pay

## 2021-11-17 NOTE — Telephone Encounter (Signed)
Transition Care Management Follow-up Telephone Call Date of discharge and from where: 11/16/21 [facility] ED. Dx: Viral URI, chest pain w/ breathing How have you been since you were released from the hospital? I'm not feeling too good. Any questions or concerns? No  Items Reviewed: Did the pt receive and understand the discharge instructions provided? Yes  Medications obtained and verified? Yes  Other? No  Any new allergies since your discharge? No  Dietary orders reviewed? No Do you have support at home? Yes   Home Care and Equipment/Supplies: Were home health services ordered? not applicable If so, what is the name of the agency? N/a  Has the agency set up a time to come to the patient's home? not applicable Were any new equipment or medical supplies ordered?  No What is the name of the medical supply agency? N/a Were you able to get the supplies/equipment? not applicable Do you have any questions related to the use of the equipment or supplies? No  Functional Questionnaire: (I = Independent and D = Dependent) ADLs: I  Bathing/Dressing- I  Meal Prep- I  Eating- I  Maintaining continence- I  Transferring/Ambulation- I  Managing Meds- I  Follow up appointments reviewed:  PCP Hospital f/u appt confirmed? Yes  Scheduled to see Dr. Veto Kemps on 12/02/21 @ 9:20am. Specialist Hospital f/u appt confirmed? No  Scheduled to see n/a on n/a @ n/a. Are transportation arrangements needed? No  If their condition worsens, is the pt aware to call PCP or go to the Emergency Dept.? Yes Was the patient provided with contact information for the PCP's office or ED? Yes Was to pt encouraged to call back with questions or concerns? Yes  Arvil Persons, RN, BSN RN Clinical Supervisor [employer]

## 2021-11-23 ENCOUNTER — Encounter: Payer: Self-pay | Admitting: Psychology

## 2021-11-26 ENCOUNTER — Emergency Department (HOSPITAL_COMMUNITY): Payer: Medicare Other

## 2021-11-26 ENCOUNTER — Other Ambulatory Visit: Payer: Self-pay

## 2021-11-26 ENCOUNTER — Encounter (HOSPITAL_COMMUNITY): Payer: Self-pay

## 2021-11-26 ENCOUNTER — Emergency Department (HOSPITAL_COMMUNITY)
Admission: EM | Admit: 2021-11-26 | Discharge: 2021-11-26 | Disposition: A | Discharge status: Home / Self Care | Payer: Medicare Other | Attending: Emergency Medicine | Admitting: Emergency Medicine

## 2021-11-26 DIAGNOSIS — Z79899 Other long term (current) drug therapy: Secondary | ICD-10-CM | POA: Insufficient documentation

## 2021-11-26 DIAGNOSIS — R059 Cough, unspecified: Secondary | ICD-10-CM | POA: Diagnosis present

## 2021-11-26 DIAGNOSIS — I110 Hypertensive heart disease with heart failure: Secondary | ICD-10-CM | POA: Diagnosis not present

## 2021-11-26 DIAGNOSIS — J069 Acute upper respiratory infection, unspecified: Secondary | ICD-10-CM | POA: Diagnosis not present

## 2021-11-26 DIAGNOSIS — Z20822 Contact with and (suspected) exposure to covid-19: Secondary | ICD-10-CM | POA: Diagnosis not present

## 2021-11-26 DIAGNOSIS — I509 Heart failure, unspecified: Secondary | ICD-10-CM | POA: Insufficient documentation

## 2021-11-26 LAB — BASIC METABOLIC PANEL
Anion gap: 6 (ref 5–15)
BUN: 13 mg/dL (ref 6–20)
CO2: 27 mmol/L (ref 22–32)
Calcium: 9.3 mg/dL (ref 8.9–10.3)
Chloride: 106 mmol/L (ref 98–111)
Creatinine, Ser: 0.58 mg/dL (ref 0.44–1.00)
GFR, Estimated: >60 mL/min (ref >60)
Glucose, Bld: 99 mg/dL (ref 70–99)
Potassium: 3.8 mmol/L (ref 3.5–5.1)
Sodium: 139 mmol/L (ref 135–145)

## 2021-11-26 LAB — CBC
HCT: 35.9 % — ABNORMAL LOW (ref 36.0–46.0)
Hemoglobin: 11.2 g/dL — ABNORMAL LOW (ref 12.0–15.0)
MCH: 27.7 pg (ref 26.0–34.0)
MCHC: 31.2 g/dL (ref 30.0–36.0)
MCV: 88.9 fL (ref 80.0–100.0)
Platelets: 264 10*3/uL (ref 150–400)
RBC: 4.04 MIL/uL (ref 3.87–5.11)
RDW: 14.1 % (ref 11.5–15.5)
WBC: 5.8 10*3/uL (ref 4.0–10.5)
nRBC: 0 % (ref 0.0–0.2)

## 2021-11-26 LAB — RESP PANEL BY RT-PCR (FLU A&B, COVID) ARPGX2
Influenza A by PCR: NEGATIVE
Influenza B by PCR: NEGATIVE
SARS Coronavirus 2 by RT PCR: NEGATIVE

## 2021-11-26 LAB — TROPONIN I (HIGH SENSITIVITY): Troponin I (High Sensitivity): 7 ng/L (ref <18)

## 2021-11-26 LAB — BRAIN NATRIURETIC PEPTIDE: B Natriuretic Peptide: 22.4 pg/mL (ref 0.0–100.0)

## 2021-11-26 MED ORDER — ALBUTEROL SULFATE (2.5 MG/3ML) 0.083% IN NEBU: 2.5000 mg | INHALATION_SOLUTION | Freq: Four times a day (QID) | RESPIRATORY_TRACT | 12 refills | Status: AC | PRN

## 2021-11-26 MED ORDER — DEXTROMETHORPHAN-GUAIFENESIN 10-100 MG/5ML PO LIQD: 5.0000 mL | ORAL | 1 refills | Status: DC | PRN

## 2021-11-26 MED ORDER — HYDROCOD POLI-CHLORPHE POLI ER 10-8 MG/5ML PO SUER
5.0000 mL | Freq: Once | ORAL | Status: AC
  Administered 2021-11-26: 5 mL via ORAL
  Filled 2021-11-26: qty 5

## 2021-11-26 MED ORDER — ALBUTEROL SULFATE HFA 108 (90 BASE) MCG/ACT IN AERS: 1.0000 | INHALATION_SPRAY | Freq: Four times a day (QID) | RESPIRATORY_TRACT | 0 refills | Status: DC | PRN

## 2021-11-26 MED ORDER — PROMETHAZINE-CODEINE 6.25-10 MG/5ML PO SYRP: 5.0000 mL | ORAL_SOLUTION | Freq: Once | ORAL | Status: DC

## 2021-11-26 MED ORDER — IPRATROPIUM-ALBUTEROL 0.5-2.5 (3) MG/3ML IN SOLN
3.0000 mL | Freq: Once | RESPIRATORY_TRACT | Status: AC
  Administered 2021-11-26: 3 mL via RESPIRATORY_TRACT
  Filled 2021-11-26: qty 3

## 2021-11-26 NOTE — ED Provider Notes (Addendum)
[facility] DEPT Provider Note   CSN: [id number] Arrival date & time: 11/26/21  1012     History  Chief Complaint  Patient presents with   Chest Pain   Cough   Shortness of Breath    Katherine Parker is a 56 y.o. female.  With past medical history of CHF, hypertension, SVT who presents to the emergency department with chest pain and shortness of breath.  States that symptoms began on 11/16/2021.  She was seen here in the emergency department at that time for having cough and upper respiratory symptoms.  States that she was discharged with Tessalon pearls.  States that since then her symptoms have only progressively worsened.  She describes having cough that has become severe and constant.  She is coughing up yellow mucus.  She feels short of breath and is having chest tightness.  She states that she has had a few episodes of posttussive emesis.  Is having sore throat and pain with swallowing.  Is generally having malaise and anorexia.  She denies having objective fevers.  Denies palpitations, dizziness, syncope, sinus pain or pressure.   On chart review she was seen on 11/16/2021 at that time complaining of subjective fever, dyspnea and cough.  At that time also had right greater than left lower extremity swelling.  She had workup including negative troponins, negative BNP, negative COVID and flu.  She had a bilateral lower extremity DVT study which was negative given her asymmetrical swelling.  She also had a chest x-ray which showed no active disease.  Was discharged with Tessalon prescription.     Home Medications Prior to Admission medications   Medication Sig Start Date End Date Taking? Authorizing Provider  albuterol (PROVENTIL) (2.5 MG/3ML) 0.083% nebulizer solution Take 3 mLs (2.5 mg total) by nebulization every 6 (six) hours as needed for wheezing or shortness of breath. 11/26/21  Yes Mickie Hillier, PA-C  albuterol (VENTOLIN HFA) 108 (90 Base) MCG/ACT  inhaler Inhale 2 puffs into the lungs every 6 (six) hours as needed for wheezing or shortness of breath. 09/08/21  Yes Haydee Salter, MD  albuterol (VENTOLIN HFA) 108 (90 Base) MCG/ACT inhaler Inhale 1-2 puffs into the lungs every 6 (six) hours as needed for wheezing or shortness of breath. 11/26/21  Yes Mickie Hillier, PA-C  dextromethorphan-guaiFENesin (TUSSIN DM) 10-100 MG/5ML liquid Take 5 mLs by mouth every 4 (four) hours as needed for cough. 11/26/21  Yes Mickie Hillier, PA-C  ibuprofen (ADVIL) 200 MG tablet Take 400 mg by mouth every 6 (six) hours as needed for mild pain.   Yes [provider]  Melatonin 10 MG TABS Take 10 mg by mouth at bedtime as needed (sleep).   Yes [provider]  metoprolol succinate (TOPROL XL) 50 MG 24 hr tablet Take 0.5 tablets (25 mg total) by mouth daily. 09/08/21  Yes Haydee Salter, MD  venlafaxine XR (EFFEXOR XR) 75 MG 24 hr capsule Take 1 capsule (75 mg total) by mouth daily with breakfast. Take togther with 150 mg dose (total dose= 225 mg) 09/08/21  Yes Haydee Salter, MD  venlafaxine XR (EFFEXOR-XR) 150 MG 24 hr capsule Take 1 capsule (150 mg total) by mouth daily with breakfast. Take together with 75 mg dose (total dose= 225 mg) 09/08/21  Yes Haydee Salter, MD  acetaminophen-codeine (TYLENOL #3) 300-30 MG tablet Take 1 tablet by mouth every 4 (four) hours as needed for moderate pain. Patient not taking: Reported on 11/26/2021  07/02/21   Loyola Mast, MD  benzonatate (TESSALON) 100 MG capsule Take 2 capsules (200 mg total) by mouth 3 (three) times daily as needed for cough (take nightly, may increase to every 8 hours up to 3 times a day as needed). Patient not taking: Reported on 11/26/2021 11/16/21 12/16/21  Gowens, Dow Adolph L, PA-C  ondansetron (ZOFRAN) 4 MG tablet Take 1 tablet (4 mg total) by mouth every 8 (eight) hours as needed for nausea or vomiting. Patient not taking: Reported on 11/26/2021 08/16/21   Lenda Kelp, MD   prochlorperazine (COMPAZINE) 5 MG tablet Take 1 tablet (5 mg total) by mouth every 6 (six) hours as needed for nausea or vomiting. Patient not taking: Reported on 11/26/2021 09/08/21   Loyola Mast, MD  terbinafine (LAMISIL) 250 MG tablet Take 1 tablet (250 mg total) by mouth daily. Patient not taking: Reported on 11/26/2021 08/13/21   Delories Heinz, DPM      Allergies    Penicillin g and Penicillins    Review of Systems   Review of Systems  Constitutional:  Negative for fever.  Respiratory:  Positive for cough, chest tightness and shortness of breath.   Cardiovascular:  Positive for chest pain and leg swelling. Negative for palpitations.  Gastrointestinal:  Positive for vomiting.  All other systems reviewed and are negative.   Physical Exam Updated Vital Signs BP 138/85   Pulse 86   Temp 98.6 F (37 C)   Resp 20   Ht 5' 6.5" (1.689 m)   Wt (!) 146 kg   SpO2 98%   BMI 51.17 kg/m  Physical Exam Vitals and nursing note reviewed.  Constitutional:      General: She is not in acute distress.    Appearance: Normal appearance. She is obese. She is ill-appearing. She is not toxic-appearing.  HENT:     Head: Normocephalic.     Mouth/Throat:     Mouth: Mucous membranes are moist.     Pharynx: Oropharynx is clear. Uvula midline. Posterior oropharyngeal erythema present.     Tonsils: No tonsillar exudate or tonsillar abscesses. 1+ on the right. 1+ on the left.     Comments: Oropharyngeal erythema with 1+ tonsillar swelling without exudates  Eyes:     General: No scleral icterus. Neck:     Vascular: No JVD.  Cardiovascular:     Rate and Rhythm: Regular rhythm. Tachycardia present.     Pulses:          Radial pulses are 2+ on the right side and 2+ on the left side.       Posterior tibial pulses are 1+ on the right side and 1+ on the left side.     Heart sounds: Normal heart sounds. No murmur heard. Pulmonary:     Effort: Tachypnea present. No respiratory distress.      Breath sounds: Examination of the right-upper field reveals wheezing. Examination of the left-lower field reveals wheezing. Wheezing present. No rhonchi or rales.     Comments: Taking short, shallow breaths. Able to take slow deep breaths on auscultation. Dry cough and hoarseness on exam.  Chest:     Chest wall: No tenderness.  Abdominal:     General: Bowel sounds are normal.     Palpations: Abdomen is soft.  Musculoskeletal:     Cervical back: Normal range of motion.     Right lower leg: Tenderness present. Edema present.     Left lower leg: Tenderness present. Edema present.  Skin:    General: Skin is warm and dry.     Capillary Refill: Capillary refill takes less than 2 seconds.  Neurological:     General: No focal deficit present.     Mental Status: She is alert and oriented to person, place, and time.  Psychiatric:        Mood and Affect: Mood normal.        Behavior: Behavior normal.     ED Results / Procedures / Treatments   Labs (all labs ordered are listed, but only abnormal results are displayed) Labs Reviewed  CBC - Abnormal; Notable for the following components:      Result Value   Hemoglobin 11.2 (*)    HCT 35.9 (*)    All other components within normal limits  RESP PANEL BY RT-PCR (FLU A&B, COVID) ARPGX2  BASIC METABOLIC PANEL  BRAIN NATRIURETIC PEPTIDE  TROPONIN I (HIGH SENSITIVITY)  TROPONIN I (HIGH SENSITIVITY)   EKG None  Radiology DG Chest 2 View  Result Date: 11/26/2021 CLINICAL DATA:  Chest pain and shortness of breath. EXAM: CHEST - 2 VIEW COMPARISON:  11/16/2021 FINDINGS: The lungs are clear without focal pneumonia, edema, pneumothorax or pleural effusion. Cardiopericardial silhouette is at upper limits of normal for size. The visualized bony structures of the thorax are unremarkable. Telemetry leads overlie the chest. IMPRESSION: No active cardiopulmonary disease. Electronically Signed   By: Misty Stanley M.D.   On: 11/26/2021 11:00     Procedures Procedures    Medications Ordered in ED Medications  ipratropium-albuterol (DUONEB) 0.5-2.5 (3) MG/3ML nebulizer solution 3 mL (3 mLs Nebulization Given 11/26/21 1135)  chlorpheniramine-HYDROcodone (TUSSIONEX) 10-8 MG/5ML suspension 5 mL (5 mLs Oral Given 11/26/21 1134)    ED Course/ Medical Decision Making/ A&P                           Medical Decision Making Amount and/or Complexity of Data Reviewed Labs: ordered. Radiology: ordered.  Risk OTC drugs. Prescription drug management.  Initial Impression and Ddx 56 year old female who is ill-appearing but nonseptic, nontoxic in appearance.  Presents with 10 days of ongoing upper respiratory symptoms.  She has had cough particularly worse in the evening.  She has hoarseness consistent with laryngitis on my exam.  Lungs with some upper lobe wheezing.  She is afebrile.  There is no oropharyngeal swelling.  Given that she has multiple comorbidities we will also obtain troponin and BNP and repeat COVID and flu testing along with her chest x-ray.  Will order a DuoNeb and tussionex and reevaluate DDx ACS, PE, pneumothorax, pleural effusion, cardiac tamponade, aortic dissection, COPD exacerbation, pneumonia, asthma exacerbation, congestive heart failure, viral upper respiratory infection, medication side effect, anaphylaxis, etc.   Patient PMH that increases complexity of ED encounter: Congestive heart failure, hypertension  Interpretation of Diagnostics I independent reviewed and interpreted the labs as followed: BMP, BNP, troponin, COVID and flu are all negative.  CBC with stable anemia  - I independently visualized the following imaging with scope of interpretation limited to determining acute life threatening conditions related to emergency care: Chest x-ray, which revealed no acute findings  Patient Reassessment and Ultimate Disposition/Management On reassessment after DuoNeb and Tussionex, the patient is much more  comfortable.  There is no longer wheezing.  She states her cough has receded.  Subjectively feels improved. Her labs are unremarkable including a troponin.  Troponin x 1 negative, EKG without ischemia or infarction.  Will not obtain  delta given the chronicity of symptoms.  Doubt ACS. Clinically does not appear volume overloaded, BNP 22, doubt CHF exacerbation. COVID and flu is negative Chest x-ray without pneumonia, pleural effusion, pneumothorax.  There is no leukocytosis or fever concerning for an active pneumonia. Symptoms are inconsistent with etiologies like myocarditis, pericarditis, tamponade, dissection. Remote history of asthma and was wheezing here on exam.  Given DuoNeb with improvement in symptoms.  May have a component of asthma exacerbation.  No history of COPD.  Considered but doubt PE.  She had lower extremity swelling the last time she was here which was negative for DVT.  There is no hypoxia.  Will not pursue D-dimer or CTA PE study. Think that this is likely ongoing viral upper respiratory infection with cough.  She may be having postviral cough syndrome causing her laryngitis and ongoing nagging symptoms. Will prescribe her with an albuterol inhaler, Tussionex, albuterol nebulizer for home.  Instructed to continue symptomatic treatment of symptoms.  She is agreeable to this plan.  Given return precautions for worsening shortness of breath or difficulty breathing.  Otherwise feel that she can follow-up with PCP.  Patient management required discussion with the following services or consulting groups:  None  Complexity of Problems Addressed Acute complicated illness or Injury  Additional Data Reviewed and Analyzed Further history obtained from: Prior ED visit notes, Care Everywhere, and Prior labs/imaging results  Patient Encounter Risk Assessment Prescriptions and Consideration of hospitalization  Final Clinical Impression(s) / ED Diagnoses Final diagnoses:  Viral upper  respiratory tract infection    Rx / DC Orders ED Discharge Orders          Ordered    dextromethorphan-guaiFENesin (TUSSIN DM) 10-100 MG/5ML liquid  Every 4 hours PRN        11/26/21 1350    albuterol (VENTOLIN HFA) 108 (90 Base) MCG/ACT inhaler  Every 6 hours PRN        11/26/21 1350    albuterol (PROVENTIL) (2.5 MG/3ML) 0.083% nebulizer solution  Every 6 hours PRN        11/26/21 1350              Mickie Hillier, PA-C 11/26/21 1414    Mickie Hillier, PA-C 11/26/21 [place], Ankit, MD 11/27/21 1534

## 2021-11-26 NOTE — ED Triage Notes (Signed)
Patient reports that she has had SOB, a productive cough with yellow blood-tinged sputum, and chest pain since 11/16/21 and states symptoms have worsened.

## 2021-11-26 NOTE — Discharge Instructions (Signed)
You were seen in the emergency department today for ongoing cough and upper respiratory infection. You do not have pneumonia. You likely having ongoing viral infection. We are providing you a prescription for Tussin DM for cough, albuterol inhaler, albuterol nebulizer. Please return to the emergency department for worsening shortness of breath or difficulty breathing.

## 2021-11-29 ENCOUNTER — Encounter: Payer: Self-pay | Admitting: Family Medicine

## 2021-11-29 ENCOUNTER — Ambulatory Visit (INDEPENDENT_AMBULATORY_CARE_PROVIDER_SITE_OTHER): Payer: Medicare Other | Admitting: Family Medicine

## 2021-11-29 VITALS — BP 124/66 | HR 103 | Temp 97.8°F | Ht 66.5 in | Wt 343.4 lb

## 2021-11-29 DIAGNOSIS — M5441 Lumbago with sciatica, right side: Secondary | ICD-10-CM | POA: Diagnosis not present

## 2021-11-29 DIAGNOSIS — F331 Major depressive disorder, recurrent, moderate: Secondary | ICD-10-CM

## 2021-11-29 DIAGNOSIS — B351 Tinea unguium: Secondary | ICD-10-CM | POA: Diagnosis not present

## 2021-11-29 DIAGNOSIS — J040 Acute laryngitis: Secondary | ICD-10-CM

## 2021-11-29 DIAGNOSIS — G44319 Acute post-traumatic headache, not intractable: Secondary | ICD-10-CM | POA: Diagnosis not present

## 2021-11-29 DIAGNOSIS — J4521 Mild intermittent asthma with (acute) exacerbation: Secondary | ICD-10-CM | POA: Diagnosis not present

## 2021-11-29 DIAGNOSIS — G8929 Other chronic pain: Secondary | ICD-10-CM

## 2021-11-29 MED ORDER — ACETAMINOPHEN-CODEINE 300-30 MG PO TABS: 1.0000 | ORAL_TABLET | ORAL | 0 refills | Status: DC | PRN

## 2021-11-29 MED ORDER — PROCHLORPERAZINE MALEATE 5 MG PO TABS: 5.0000 mg | ORAL_TABLET | Freq: Four times a day (QID) | ORAL | 0 refills | Status: DC | PRN

## 2021-11-29 MED ORDER — VENLAFAXINE HCL ER 150 MG PO CP24: 150.0000 mg | ORAL_CAPSULE | Freq: Every day | ORAL | 3 refills | Status: DC

## 2021-11-29 MED ORDER — DEXTROMETHORPHAN-GUAIFENESIN 10-100 MG/5ML PO LIQD: 5.0000 mL | ORAL | 1 refills | Status: DC | PRN

## 2021-11-29 MED ORDER — VENLAFAXINE HCL ER 75 MG PO CP24: 75.0000 mg | ORAL_CAPSULE | Freq: Every day | ORAL | 3 refills | Status: DC

## 2021-11-29 MED ORDER — TERBINAFINE HCL 250 MG PO TABS: 250.0000 mg | ORAL_TABLET | Freq: Every day | ORAL | 0 refills | Status: DC

## 2021-11-29 MED ORDER — METHYLPREDNISOLONE 4 MG PO TBPK: ORAL_TABLET | ORAL | 0 refills | Status: DC

## 2021-11-29 NOTE — Progress Notes (Signed)
[facility] [address] [place] [address] Dept: [phone] Dept Fax: [phone]  Office Visit  Subjective:    Patient ID: Katherine Parker, female    DOB: 1965/10/03, 56 y.o..   MRN: [id number]  Chief Complaint  Patient presents with   Acute Visit    C/o having lost her voice x 2 weeks. Also having ST, ear pain, N&V., HA's.  Went to hospital 11/26/21 (neg covid/flu)     History of Present Illness:  Patient is in today for with ongoing issues with severe hoarseness, cough,sore throat, ear pain, and N/V. Ms. Gatlin was seen at [facility] ED on 11/16/2021 and 11/26/2021. She had developed a respiratory illness. She had a cardiac workup and has had 2 chest x-rays. After her first visit, she was treated with Tessalon perles and Tylenol. After the 2nd visit, she was given Tussin DM cough syrup, an albuterol inhaler, and solution for an albuterol nebulizer (which she does not own). She continues to have symptoms currently. This illness started soon after the death of her mother. She is grieving this loss. She is also very fearful that she will develop the same chronic respiratory issues her mother had.  Past Medical History: Patient Active Problem List   Diagnosis Date Noted   Vitiligo 07/27/2021   Obstructive sleep apnea 03/24/2021   Stutter 03/24/2021   Paroxysmal SVT (supraventricular tachycardia) 11/18/2020   Normocytic anemia 10/20/2020   Spondylolisthesis at L5-S1 level 10/20/2020   Prediabetes 10/20/2020   Hypertrophic cardiomyopathy (HCC) 10/13/2020   Morbid obesity with BMI of 50.0-59.9, adult (HCC) 10/13/2020   Essential hypertension 10/13/2020   Chronic right-sided low back pain with right-sided sciatica 10/13/2020   Borderline hyperlipidemia 03/09/2020   Insomnia 03/09/2020   Mild intermittent asthma 03/09/2020   Moderate recurrent major depression (HCC) 03/09/2020   Past Surgical History:  Procedure Laterality Date    CESAREAN SECTION     GUM SURGERY     TUBAL LIGATION     Family History  Problem Relation Age of Onset   Heart disease Mother    COPD Mother    Congestive Heart Failure Mother    Hypertension Mother    Diabetes Mother    Dementia Father    Diabetes Maternal Grandmother    Colon cancer Maternal Grandmother    Dementia Paternal Grandmother    Dementia Paternal Grandfather    Cancer Half-Sister        Breast, brain metastasis   Cancer Half-Sister        Uterine   Outpatient Medications Prior to Visit  Medication Sig Dispense Refill   albuterol (PROVENTIL) (2.5 MG/3ML) 0.083% nebulizer solution Take 3 mLs (2.5 mg total) by nebulization every 6 (six) hours as needed for wheezing or shortness of breath. 75 mL 12   albuterol (VENTOLIN HFA) 108 (90 Base) MCG/ACT inhaler Inhale 1-2 puffs into the lungs every 6 (six) hours as needed for wheezing or shortness of breath. 18 g 0   benzonatate (TESSALON) 100 MG capsule Take 2 capsules (200 mg total) by mouth 3 (three) times daily as needed for cough (take nightly, may increase to every 8 hours up to 3 times a day as needed). 90 capsule 0   ibuprofen (ADVIL) 200 MG tablet Take 400 mg by mouth every 6 (six) hours as needed for mild pain.     Melatonin 10 MG TABS Take 10 mg by mouth at bedtime as needed (sleep).     metoprolol succinate (TOPROL XL)  50 MG 24 hr tablet Take 0.5 tablets (25 mg total) by mouth daily. 90 tablet 3   albuterol (VENTOLIN HFA) 108 (90 Base) MCG/ACT inhaler Inhale 2 puffs into the lungs every 6 (six) hours as needed for wheezing or shortness of breath. 6.7 g 6   dextromethorphan-guaiFENesin (TUSSIN DM) 10-100 MG/5ML liquid Take 5 mLs by mouth every 4 (four) hours as needed for cough. 180 mL 1   prochlorperazine (COMPAZINE) 5 MG tablet Take 1 tablet (5 mg total) by mouth every 6 (six) hours as needed for nausea or vomiting. 30 tablet 0   terbinafine (LAMISIL) 250 MG tablet Take 1 tablet (250 mg total) by mouth daily. 90 tablet 0    venlafaxine XR (EFFEXOR XR) 75 MG 24 hr capsule Take 1 capsule (75 mg total) by mouth daily with breakfast. Take togther with 150 mg dose (total dose= 225 mg) 90 capsule 3   venlafaxine XR (EFFEXOR-XR) 150 MG 24 hr capsule Take 1 capsule (150 mg total) by mouth daily with breakfast. Take together with 75 mg dose (total dose= 225 mg) 90 capsule 3   acetaminophen-codeine (TYLENOL #3) 300-30 MG tablet Take 1 tablet by mouth every 4 (four) hours as needed for moderate pain. (Patient not taking: Reported on 11/26/2021) 10 tablet 0   ondansetron (ZOFRAN) 4 MG tablet Take 1 tablet (4 mg total) by mouth every 8 (eight) hours as needed for nausea or vomiting. (Patient not taking: Reported on 11/26/2021) 60 tablet 1   No facility-administered medications prior to visit.   Allergies  Allergen Reactions   Penicillin G     Other reaction(s): Unknown   Penicillins     Did it involve swelling of the face/tongue/throat, SOB, or low BP? Unknown Did it involve sudden or severe rash/hives, skin peeling, or any reaction on the inside of your mouth or nose? Unknown Did you need to seek medical attention at a hospital or doctor's office? Unknown When did it last happen?  Childhood    If all above answers are "NO", may proceed with cephalosporin use.       Objective:   Today's Vitals   11/29/21 1602  BP: 124/66  Pulse: (!) 103  Temp: 97.8 F (36.6 C)  TempSrc: Temporal  SpO2: 96%  Weight: (!) 343 lb 6.4 oz (155.8 kg)  Height: 5' 6.5" (1.689 m)   Body mass index is 54.6 kg/m.   General: Well developed, well nourished. No acute distress. HEENT: Normocephalic, non-traumatic. Eyes clear. External ears normal. EAC and TMs   normal bilaterally. Nose  clear without congestion or rhinorrhea. Mucous membranes moist.   Oropharynx does show some diffuse redness, but no mucous drainage. Lungs: Bilateral diffuse wheezing without rales or rhonchi. CV: RRR without murmurs or rubs. Pulses 2+  bilaterally. Psych: Alert and oriented. Normal mood and affect.  Health Maintenance Due  Topic Date Due   HIV Screening  Never done   Hepatitis C Screening  Never done   MAMMOGRAM  Never done   Zoster Vaccines- Shingrix (1 of 2) Never done     Assessment & Plan:   1. Mild intermittent asthma with acute exacerbation I reviewed ED visits x 2, lab and x-ray results. She did have wheezing noted at both visits, but was not treated with steroids. I do feel they are indicated to help break the current asthma exacerbation. I will renew her cough syrup. I will also give her a prescription for the nebulizer.  - dextromethorphan-guaiFENesin (TUSSIN DM) 10-100 MG/5ML liquid;  Take 5 mLs by mouth every 4 (four) hours as needed for cough.  Dispense: 180 mL; Refill: 1 - methylPREDNISolone (MEDROL DOSEPAK) 4 MG TBPK tablet; Take per package instructions  Dispense: 21 tablet; Refill: 0 - For home use only DME Nebulizer machine  2. Laryngitis Likely will improve with the steroid course. She shodul rest her voice as she is able.  3. Chronic right-sided low back pain with right-sided sciatica I will renew a small amount of T#3 for cautious use for pain.  - acetaminophen-codeine (TYLENOL #3) 300-30 MG tablet; Take 1 tablet by mouth every 4 (four) hours as needed for moderate pain.  Dispense: 10 tablet; Refill: 0  4. Acute post-traumatic headache, not intractable We will continue Compazine to help with nausea and vomiting that have been part of a post-concussive headache issue.  - prochlorperazine (COMPAZINE) 5 MG tablet; Take 1 tablet (5 mg total) by mouth every 6 (six) hours as needed for nausea or vomiting.  Dispense: 30 tablet; Refill: 0  5. Moderate recurrent major depression (HCC) Continue her venlafaxine 225 mg daily.  - venlafaxine XR (EFFEXOR XR) 75 MG 24 hr capsule; Take 1 capsule (75 mg total) by mouth daily with breakfast. Take togther with 150 mg dose (total dose= 225 mg)  Dispense: 90  capsule; Refill: 3 - venlafaxine XR (EFFEXOR-XR) 150 MG 24 hr capsule; Take 1 capsule (150 mg total) by mouth daily with breakfast. Take together with 75 mg dose (total dose= 225 mg)  Dispense: 90 capsule; Refill: 3  6. Onychomycosis Nails have shown some clearing. I will check her Ast/ALT today. Plan to continue Lamisil for an additional three months.  - terbinafine (LAMISIL) 250 MG tablet; Take 1 tablet (250 mg total) by mouth daily.  Dispense: 90 tablet; Refill: 0 - ALT - AST   Return in about 1 week (around 12/06/2021) for Reassessment.   Loyola Mast, MD

## 2021-11-30 LAB — AST: AST: 24 U/L (ref 0–37)

## 2021-11-30 LAB — ALT: ALT: 10 U/L (ref 0–35)

## 2021-12-02 ENCOUNTER — Inpatient Hospital Stay: Payer: Medicare Other | Admitting: Family Medicine

## 2021-12-02 ENCOUNTER — Telehealth: Payer: Self-pay | Admitting: Family Medicine

## 2021-12-02 NOTE — Telephone Encounter (Signed)
Pt was a no show 11/30 for a HFU with Dr. Veto Kemps. This is her second

## 2021-12-06 NOTE — Telephone Encounter (Signed)
2nd no show, fee generated, letter sent via mail 

## 2021-12-08 ENCOUNTER — Encounter: Payer: Self-pay | Admitting: Family Medicine

## 2021-12-08 ENCOUNTER — Ambulatory Visit (INDEPENDENT_AMBULATORY_CARE_PROVIDER_SITE_OTHER): Payer: Medicare Other | Admitting: Family Medicine

## 2021-12-08 VITALS — BP 130/72 | HR 84 | Temp 97.5°F | Ht 66.5 in | Wt 344.6 lb

## 2021-12-08 DIAGNOSIS — R1013 Epigastric pain: Secondary | ICD-10-CM | POA: Diagnosis not present

## 2021-12-08 DIAGNOSIS — J040 Acute laryngitis: Secondary | ICD-10-CM

## 2021-12-08 DIAGNOSIS — M5441 Lumbago with sciatica, right side: Secondary | ICD-10-CM

## 2021-12-08 DIAGNOSIS — G8929 Other chronic pain: Secondary | ICD-10-CM

## 2021-12-08 DIAGNOSIS — Z8659 Personal history of other mental and behavioral disorders: Secondary | ICD-10-CM

## 2021-12-08 DIAGNOSIS — K5909 Other constipation: Secondary | ICD-10-CM

## 2021-12-08 DIAGNOSIS — J4521 Mild intermittent asthma with (acute) exacerbation: Secondary | ICD-10-CM | POA: Diagnosis not present

## 2021-12-08 MED ORDER — ACETAMINOPHEN-CODEINE 300-30 MG PO TABS: 1.0000 | ORAL_TABLET | ORAL | 0 refills | Status: DC | PRN

## 2021-12-08 MED ORDER — PREDNISONE 20 MG PO TABS: ORAL_TABLET | ORAL | 0 refills | Status: DC

## 2021-12-08 NOTE — Progress Notes (Signed)
[facility] [address] [place] [address] Dept: [phone] Dept Fax: [phone]  Office Visit  Subjective:    Patient ID: Katherine Parker, female    DOB: [birth year], 56 y.o..   MRN: [id number]  Chief Complaint  Patient presents with   Follow-up    1 week f/u.      History of Present Illness:  Patient is in today for follow-up regarding her recent URI with exacerbation of her asthma. Katherine Parker was seen at [facility] ED on 11/16/2021 and 11/26/2021. She had developed a respiratory illness. She had a cardiac workup and has had 2 chest x-rays. After her first visit, she was treated with Tessalon perles and Tylenol. After the 2nd visit, she was given Tussin DM cough syrup, an albuterol inhaler, and solution for an albuterol nebulizer. When I saw her in follow-up last week., she was continuing ot have diffuse wheezing. I prescribed a Medrol dosepak. She notes she is feeling some better at this point, but still having some cough. She also continues to be quite hoarse.  Katherine Parker also complains of several GI issues. She has noted that when she eats, it feels very heavy on her stomach. She is not necessarily having reflux. She notes she has a past history of bulimia. She has always felt she damaged her stomach with her purging at the time. She also complains of chronic constipation. She is using a daily fiber laxative and eats prunes. Despite this, she has trouble with moving her bowels.  Past Medical History: Patient Active Problem List   Diagnosis Date Noted   Chronic constipation 12/08/2021   History of bulimia nervosa 12/08/2021   Vitiligo 07/27/2021   Obstructive sleep apnea 03/24/2021   Stutter 03/24/2021   Paroxysmal SVT (supraventricular tachycardia) 11/18/2020   Normocytic anemia 10/20/2020   Spondylolisthesis at L5-S1 level 10/20/2020   Prediabetes 10/20/2020   Hypertrophic cardiomyopathy ([place]) 10/13/2020   Morbid obesity  with BMI of 50.0-59.9, adult ([place]) 10/13/2020   Essential hypertension 10/13/2020   Chronic right-sided low back pain with right-sided sciatica 10/13/2020   Borderline hyperlipidemia 03/09/2020   Insomnia 03/09/2020   Mild intermittent asthma 03/09/2020   Moderate recurrent major depression ([place]) 03/09/2020   Past Surgical History:  Procedure Laterality Date   CESAREAN SECTION     GUM SURGERY     TUBAL LIGATION     Family History  Problem Relation Age of Onset   Heart disease Mother    COPD Mother    Congestive Heart Failure Mother    Hypertension Mother    Diabetes Mother    Dementia Father    Diabetes Maternal Grandmother    Colon cancer Maternal Grandmother    Dementia Paternal Grandmother    Dementia Paternal Grandfather    Cancer Half-Sister        Breast, brain metastasis   Cancer Half-Sister        Uterine   Outpatient Medications Prior to Visit  Medication Sig Dispense Refill   albuterol (PROVENTIL) (2.5 MG/3ML) 0.083% nebulizer solution Take 3 mLs (2.5 mg total) by nebulization every 6 (six) hours as needed for wheezing or shortness of breath. 75 mL 12   albuterol (VENTOLIN HFA) 108 (90 Base) MCG/ACT inhaler Inhale 1-2 puffs into the lungs every 6 (six) hours as needed for wheezing or shortness of breath. 18 g 0   benzonatate (TESSALON) 100 MG capsule Take 2 capsules (200 mg total) by mouth 3 (three) times daily as needed for  cough (take nightly, may increase to every 8 hours up to 3 times a day as needed). 90 capsule 0   dextromethorphan-guaiFENesin (TUSSIN DM) 10-100 MG/5ML liquid Take 5 mLs by mouth every 4 (four) hours as needed for cough. 180 mL 1   ibuprofen (ADVIL) 200 MG tablet Take 400 mg by mouth every 6 (six) hours as needed for mild pain.     Melatonin 10 MG TABS Take 10 mg by mouth at bedtime as needed (sleep).     metoprolol succinate (TOPROL XL) 50 MG 24 hr tablet Take 0.5 tablets (25 mg total) by mouth daily. 90 tablet 3   prochlorperazine (COMPAZINE)  5 MG tablet Take 1 tablet (5 mg total) by mouth every 6 (six) hours as needed for nausea or vomiting. 30 tablet 0   terbinafine (LAMISIL) 250 MG tablet Take 1 tablet (250 mg total) by mouth daily. 90 tablet 0   venlafaxine XR (EFFEXOR XR) 75 MG 24 hr capsule Take 1 capsule (75 mg total) by mouth daily with breakfast. Take togther with 150 mg dose (total dose= 225 mg) 90 capsule 3   venlafaxine XR (EFFEXOR-XR) 150 MG 24 hr capsule Take 1 capsule (150 mg total) by mouth daily with breakfast. Take together with 75 mg dose (total dose= 225 mg) 90 capsule 3   acetaminophen-codeine (TYLENOL #3) 300-30 MG tablet Take 1 tablet by mouth every 4 (four) hours as needed for moderate pain. 10 tablet 0   methylPREDNISolone (MEDROL DOSEPAK) 4 MG TBPK tablet Take per package instructions 21 tablet 0   No facility-administered medications prior to visit.   Allergies  Allergen Reactions   Penicillin G     Other reaction(s): Unknown   Penicillins     Did it involve swelling of the face/tongue/throat, SOB, or low BP? Unknown Did it involve sudden or severe rash/hives, skin peeling, or any reaction on the inside of your mouth or nose? Unknown Did you need to seek medical attention at a hospital or doctor's office? Unknown When did it last happen?  Childhood    If all above answers are "NO", may proceed with cephalosporin use.     Objective:   Today's Vitals   12/08/21 0954  BP: 130/72  Pulse: 84  Temp: (!) 97.5 F (36.4 C)  TempSrc: Temporal  SpO2: 97%  Weight: (!) 344 lb 9.6 oz (156.3 kg)  Height: 5' 6.5" (1.689 m)   Body mass index is 54.79 kg/m.   General: Well developed, well nourished. No acute distress. Lungs: Diffuse, mild wheezing. No rales or rhonchi. Psych: Alert and oriented. Normal mood and affect.  Health Maintenance Due  Topic Date Due   HIV Screening  Never done   Hepatitis C Screening  Never done   MAMMOGRAM  Never done   Zoster Vaccines- Shingrix (1 of 2) Never done      Assessment & Plan:   1. Mild intermittent asthma with acute exacerbation Katherine Parker is persisting with wheezing at this point. I willtry a longer tper of prednisone to see if we can get her back to her baseline. I will plant o reassess her in 10 days. If the wheezing persists, she may need addition of a steroid inhaler and/or referral to pulmonology.  - predniSONE (DELTASONE) 20 MG tablet; Take 2 tablets (40 mg total) by mouth daily with breakfast for 4 days, THEN 1 tablet (20 mg total) daily with breakfast for 4 days, THEN 0.5 tablets (10 mg total) daily with breakfast for 4 days.  Dispense: 14 tablet; Refill: 0  2. Laryngitis Continue to try and rest her voice. If persistent, will consider ENT referral for nasopharyngoscopy.  3. Dyspepsia 4. History of bulimia nervosa I will refer to GI to consider a possible EGD.  - Ambulatory referral to Gastroenterology  5. Chronic constipation She should continue the focus on adequate fiber and fluid intake. I will refer to GI to consider a colonoscopy.  - Ambulatory referral to Gastroenterology  6. Chronic right-sided low back pain with right-sided sciatica We will continue some judicious use of T#3.  - acetaminophen-codeine (TYLENOL #3) 300-30 MG tablet; Take 1 tablet by mouth every 4 (four) hours as needed for moderate pain.  Dispense: 10 tablet; Refill: 0   Return in about 10 days (around 12/18/2021) for Reassessment.   Haydee Salter, MD

## 2021-12-20 ENCOUNTER — Ambulatory Visit (INDEPENDENT_AMBULATORY_CARE_PROVIDER_SITE_OTHER): Payer: Medicare Other | Admitting: Family Medicine

## 2021-12-20 ENCOUNTER — Encounter: Payer: Self-pay | Admitting: Family Medicine

## 2021-12-20 VITALS — BP 140/90 | HR 67 | Temp 97.0°F | Ht 66.5 in | Wt 345.6 lb

## 2021-12-20 DIAGNOSIS — Z1231 Encounter for screening mammogram for malignant neoplasm of breast: Secondary | ICD-10-CM | POA: Diagnosis not present

## 2021-12-20 DIAGNOSIS — J4521 Mild intermittent asthma with (acute) exacerbation: Secondary | ICD-10-CM | POA: Diagnosis not present

## 2021-12-20 DIAGNOSIS — R112 Nausea with vomiting, unspecified: Secondary | ICD-10-CM

## 2021-12-20 DIAGNOSIS — K5909 Other constipation: Secondary | ICD-10-CM | POA: Diagnosis not present

## 2021-12-20 LAB — COMPREHENSIVE METABOLIC PANEL
ALT: 9 U/L (ref 0–35)
AST: 18 U/L (ref 0–37)
Albumin: 4.4 g/dL (ref 3.5–5.2)
Alkaline Phosphatase: 98 U/L (ref 39–117)
BUN: 12 mg/dL (ref 6–23)
CO2: 29 mEq/L (ref 19–32)
Calcium: 9.9 mg/dL (ref 8.4–10.5)
Chloride: 100 mEq/L (ref 96–112)
Creatinine, Ser: 0.76 mg/dL (ref 0.40–1.20)
GFR: 86.57 mL/min (ref >60.00)
Glucose, Bld: 102 mg/dL — ABNORMAL HIGH (ref 70–99)
Potassium: 3.7 mEq/L (ref 3.5–5.1)
Sodium: 140 mEq/L (ref 135–145)
Total Bilirubin: 0.6 mg/dL (ref 0.2–1.2)
Total Protein: 7.6 g/dL (ref 6.0–8.3)

## 2021-12-20 LAB — LIPASE: Lipase: 18 U/L (ref 11.0–59.0)

## 2021-12-20 LAB — CBC
HCT: 38.4 % (ref 36.0–46.0)
Hemoglobin: 12.6 g/dL (ref 12.0–15.0)
MCHC: 32.7 g/dL (ref 30.0–36.0)
MCV: 86.3 fl (ref 78.0–100.0)
Platelets: 324 10*3/uL (ref 150.0–400.0)
RBC: 4.45 Mil/uL (ref 3.87–5.11)
RDW: 15.1 % (ref 11.5–15.5)
WBC: 6.2 10*3/uL (ref 4.0–10.5)

## 2021-12-20 MED ORDER — PREDNISONE 20 MG PO TABS: ORAL_TABLET | ORAL | 0 refills | Status: AC

## 2021-12-20 MED ORDER — ALBUTEROL SULFATE HFA 108 (90 BASE) MCG/ACT IN AERS: 1.0000 | INHALATION_SPRAY | Freq: Four times a day (QID) | RESPIRATORY_TRACT | 0 refills | Status: DC | PRN

## 2021-12-20 MED ORDER — PROCHLORPERAZINE MALEATE 5 MG PO TABS: 5.0000 mg | ORAL_TABLET | Freq: Four times a day (QID) | ORAL | 0 refills | Status: DC | PRN

## 2021-12-20 NOTE — Progress Notes (Signed)
[facility] [address] [place] [address] Dept: [phone] Dept Fax: [phone]  Chronic Care Office Visit  Subjective:    Patient ID: Adaliah Marana, female    DOB: [birth year], 56 y.o..   MRN: [id number]  Chief Complaint  Patient presents with   Follow-up    10 day f/u. C/o still having persistent cough, constipation, N&V.   Wants to see about getting cancer screenings.     History of Present Illness:  Patient is in today for reassessment of chronic medical issues.  Ms. Maynor is in today for follow-up regarding her ongoing asthma exacerbation. Ms. Metro was seen at [facility] ED on 11/16/2021 and 11/26/2021. She had developed a respiratory illness. She had a cardiac workup and has had 2 chest x-rays. After her first visit, she was treated with Tessalon perles and Tylenol. After the 2nd visit, she was given Tussin DM cough syrup, an albuterol inhaler, and solution for an albuterol nebulizer. She has had two courses of prednisone. She continues to cough and is noting more shortness of breath when exerting herself.   Ms. Allcorn notes in the past few days, she has been having nausea with recurrent vomiting. She had previously been having difficult to manage dyspepsia. She also has a history of chronic constipation. She notes an ongoing issue of having only very small amounts with bowel movements. She is using metamucil. She took both Miralax and Ex-Lax last night, but has not had any relief as of yet. At her recent visit, I had referred her to GI for both evaluation of these GI complaints and for a colonoscopy.  Past Medical History: Patient Active Problem List   Diagnosis Date Noted   Chronic constipation 12/08/2021   History of bulimia nervosa 12/08/2021   Vitiligo 07/27/2021   Obstructive sleep apnea 03/24/2021   Stutter 03/24/2021   Paroxysmal SVT (supraventricular tachycardia) 11/18/2020   Normocytic anemia 10/20/2020    Spondylolisthesis at L5-S1 level 10/20/2020   Prediabetes 10/20/2020   Hypertrophic cardiomyopathy (HCC) 10/13/2020   Morbid obesity with BMI of 50.0-59.9, adult (HCC) 10/13/2020   Essential hypertension 10/13/2020   Chronic right-sided low back pain with right-sided sciatica 10/13/2020   Borderline hyperlipidemia 03/09/2020   Insomnia 03/09/2020   Mild intermittent asthma 03/09/2020   Moderate recurrent major depression (HCC) 03/09/2020   Past Surgical History:  Procedure Laterality Date   CESAREAN SECTION     GUM SURGERY     TUBAL LIGATION     Family History  Problem Relation Age of Onset   Heart disease Mother    COPD Mother    Congestive Heart Failure Mother    Hypertension Mother    Diabetes Mother    Dementia Father    Diabetes Maternal Grandmother    Colon cancer Maternal Grandmother    Dementia Paternal Grandmother    Dementia Paternal Grandfather    Cancer Half-Sister        Breast, brain metastasis   Cancer Half-Sister        Uterine   Outpatient Medications Prior to Visit  Medication Sig Dispense Refill   acetaminophen-codeine (TYLENOL #3) 300-30 MG tablet Take 1 tablet by mouth every 4 (four) hours as needed for moderate pain. 10 tablet 0   albuterol (PROVENTIL) (2.5 MG/3ML) 0.083% nebulizer solution Take 3 mLs (2.5 mg total) by nebulization every 6 (six) hours as needed for wheezing or shortness of breath. 75 mL 12   ibuprofen (ADVIL) 200 MG tablet Take 400 mg  by mouth every 6 (six) hours as needed for mild pain.     Melatonin 10 MG TABS Take 10 mg by mouth at bedtime as needed (sleep).     metoprolol succinate (TOPROL XL) 50 MG 24 hr tablet Take 0.5 tablets (25 mg total) by mouth daily. 90 tablet 3   prochlorperazine (COMPAZINE) 5 MG tablet Take 1 tablet (5 mg total) by mouth every 6 (six) hours as needed for nausea or vomiting. 30 tablet 0   terbinafine (LAMISIL) 250 MG tablet Take 1 tablet (250 mg total) by mouth daily. 90 tablet 0   venlafaxine XR (EFFEXOR  XR) 75 MG 24 hr capsule Take 1 capsule (75 mg total) by mouth daily with breakfast. Take togther with 150 mg dose (total dose= 225 mg) 90 capsule 3   venlafaxine XR (EFFEXOR-XR) 150 MG 24 hr capsule Take 1 capsule (150 mg total) by mouth daily with breakfast. Take together with 75 mg dose (total dose= 225 mg) 90 capsule 3   albuterol (VENTOLIN HFA) 108 (90 Base) MCG/ACT inhaler Inhale 1-2 puffs into the lungs every 6 (six) hours as needed for wheezing or shortness of breath. 18 g 0   dextromethorphan-guaiFENesin (TUSSIN DM) 10-100 MG/5ML liquid Take 5 mLs by mouth every 4 (four) hours as needed for cough. 180 mL 1   predniSONE (DELTASONE) 20 MG tablet Take 2 tablets (40 mg total) by mouth daily with breakfast for 4 days, THEN 1 tablet (20 mg total) daily with breakfast for 4 days, THEN 0.5 tablets (10 mg total) daily with breakfast for 4 days. 14 tablet 0   No facility-administered medications prior to visit.   Allergies  Allergen Reactions   Penicillin G     Other reaction(s): Unknown   Penicillins     Did it involve swelling of the face/tongue/throat, SOB, or low BP? Unknown Did it involve sudden or severe rash/hives, skin peeling, or any reaction on the inside of your mouth or nose? Unknown Did you need to seek medical attention at a hospital or doctor's office? Unknown When did it last happen?  Childhood    If all above answers are "NO", may proceed with cephalosporin use.     Objective:   Today's Vitals   12/20/21 1325  BP: (!) 144/90  Pulse: 67  Temp: (!) 97 F (36.1 C)  TempSrc: Temporal  SpO2: 97%  Weight: (!) 345 lb 9.6 oz (156.8 kg)  Height: 5' 6.5" (1.689 m)   Body mass index is 54.95 kg/m.   General: Well developed, well nourished. No acute distress. HEENT: Normocephalic, non-traumatic. External ears normal. EAC and TMs normal bilaterally. Eyes are   clear. Nose with mild congestion or rhinorrhea. Mucous membranes moist. Oropharynx clear. Good   dentition. Neck:  Supple. No lymphadenopathy. No thyromegaly. Lungs: Diffuse wheezing. No rales or rhonchi. CV: RRR without murmurs or rubs. Pulses 2+ bilaterally. Abdomen: Soft. Mildly tender in left abdomen.  Psych: Alert and oriented. Normal mood and affect.  Health Maintenance Due  Topic Date Due   HIV Screening  Never done   Hepatitis C Screening  Never done   MAMMOGRAM  Never done   Zoster Vaccines- Shingrix (1 of 2) Never done     Assessment & Plan:   1. Mild intermittent asthma with acute exacerbation Ms. Roam continues to have significant wheezing, despite two courses of steroids. I will have her use her albuterol every 4 hours while awake for the next 48 hours. I will also continue another course  of prednisone. I will refer her to pulmonology to assess the underlying issue. She needs PFTs and possibly a chest CT.  - albuterol (VENTOLIN HFA) 108 (90 Base) MCG/ACT inhaler; Inhale 1-2 puffs into the lungs every 6 (six) hours as needed for wheezing or shortness of breath.  Dispense: 18 g; Refill: 0 - predniSONE (DELTASONE) 20 MG tablet; Take 2 tablets (40 mg total) by mouth daily with breakfast for 4 days, THEN 1 tablet (20 mg total) daily with breakfast for 4 days, THEN 0.5 tablets (10 mg total) daily with breakfast for 4 days.  Dispense: 14 tablet; Refill: 0 - Ambulatory referral to Pulmonology  2. Chronic constipation This is likely the cause of her crampy abdominal pain. I had referred her recently to GI. I iwll have my MA provide her the phone number so she can follow up on scheduling for this.  3. Nausea and vomiting, unspecified vomiting type I suspect this is more of a factor of her chronic GI complaints rather than an acute issue. I will check some labs to exclude other causes of GI discomfort with N/V.  - CBC - Comprehensive metabolic panel - Lipase - prochlorperazine (COMPAZINE) 5 MG tablet; Take 1 tablet (5 mg total) by mouth every 6 (six) hours as needed for nausea or vomiting.   Dispense: 30 tablet; Refill: 0  4. Encounter for screening mammogram for malignant neoplasm of breast  - MM DIGITAL SCREENING BILATERAL; Future  Return in about 4 weeks (around 01/17/2022) for Reassessment.   Haydee Salter, MD

## 2022-01-10 ENCOUNTER — Institutional Professional Consult (permissible substitution) (HOSPITAL_BASED_OUTPATIENT_CLINIC_OR_DEPARTMENT_OTHER): Payer: Medicare Other | Admitting: Pulmonary Disease

## 2022-01-17 ENCOUNTER — Ambulatory Visit: Payer: Medicare Other | Admitting: Family Medicine

## 2022-01-17 NOTE — Progress Notes (Deleted)
SUBJECTIVE:   CHIEF COMPLAINT / HPI:   Post Concussion Syndome follow up:  As per the last visit, she had worsening symptoms of headaches, photophobia, phonophobia.  She had also been experiencing nausea.  At that time, she had recently lost her mom and had been experiencing grief which seem to be a component of her worsening symptoms.  She had a referral made for CBT.  She was also given a referral for neurology for posttraumatic headaches.  Instructed at that time to continue with metoprolol, Effexor, Compazine for nausea.   Today, she reports    See previous visit history below:  6/28: Slipped and fell 2 days ago at [facility] and struck her head on the floor, no loss of consciousness.  Reported to the ED after the event and had head CT, cervical CT, thoracic and lumbar spine CT which were all unremarkable for acute abnormalities or fractures.  Lumbar spine did demonstrate anterolisthesis, likely chronic and not acute.  She was diagnosed with a concussion and sent home with narcotics, muscle relaxer, and naproxen to help with muscle aches and headaches.  Today she reports that she continues with headaches averaging about 4 times per day and positive for phonophobia and photophobia with intermittent nausea.  Resting in a dark room with minimal to no sounds helps her headaches.  Muscle aches have worsened since her fall 2 days ago.  Mental and physical activity worsen her symptoms.  No episodes of emesis but does report continued intermittent nausea.  No prior history of concussion.  No focal neurological deficits, acute vision changes, syncope or presyncope, or recent repeat falls since the initial event.   7/17: Patient reports she is improved compared to last visit. Frontal headache her main complaint - comes on without any specific trigger. Taking tylenol for this. Also with nausea and dizziness. SCAT symptoms 22/22 with severity 76/132, improved.   8/14: Reports ~20% improvement in  symptoms from last visit Frontal and occipital headache remains, intermittently. Having nausea a couple times a week, would like a refill on Zofran Having memory issues as well Also has stress regarding mother now in palliative care SCAT symptoms 22/22 with severity 83/132, worsened.   9/13: Patient reports she's overall improved. Just recently went up on toprol. Doing well with vestibular therapy. Taking zofran about once weekly. Headaches on right side main issue now. SCAT symptoms 22/22 with severity 55/132.   11/13: Patient returns with worsening symptoms compared to last visit. Worse headaches, photophobia, phonophobia. She has nausea as well, takes compazine as needed. [address] twice recently due to dizziness as well. Taking metoprolol. SCAT symptoms 22/22 with severity  49/132  PERTINENT  PMH / PSH:   Past Medical History:  Diagnosis Date   Anxiety    CHF (congestive heart failure) (HCC)    Hypertension    SVT (supraventricular tachycardia)     OBJECTIVE:  There were no vitals taken for this visit. Physical Exam   Neuro: CN II: PERRL CN III, IV,VI: EOMI CV V: Normal sensation in V1, V2, V3 CVII: Symmetric smile and brow raise CN VIII: Normal hearing CN IX,X: Symmetric palate raise  CN XI: 5/5 shoulder shrug CN XII: Symmetric tongue protrusion  UE and LE strength 5/5 2+ UE and LE reflexes  Normal sensation in UE and LE bilaterally  No ataxia with finger to nose, normal heel to shin  Negative Rhomberg   Saccades:  Gaze instability  Head eye coordination   ASSESSMENT/PLAN:  There are no diagnoses  linked to this encounter. No follow-ups on file. Erskine Emery, MD 01/17/2022, 8:55 AM PGY-2, [facility] {    This will disappear when note is signed, click to select method of visit    :1}

## 2022-02-07 ENCOUNTER — Ambulatory Visit (INDEPENDENT_AMBULATORY_CARE_PROVIDER_SITE_OTHER): Payer: Medicare Other | Admitting: Family Medicine

## 2022-02-07 VITALS — BP 156/96 | Ht 67.0 in | Wt 380.0 lb

## 2022-02-07 DIAGNOSIS — F0781 Postconcussional syndrome: Secondary | ICD-10-CM | POA: Diagnosis not present

## 2022-02-07 NOTE — Patient Instructions (Signed)
Edgardo Roys, PsyD Address: [address], [place], [place] [address] Phone: [phone]

## 2022-02-07 NOTE — Progress Notes (Unsigned)
  Katherine Parker - 57 y.o. female MRN [id number]  Date of birth: 1965/04/26  SUBJECTIVE:  Including CC & ROS.  CC: Follow up for concussion  HPI: Patient is a 57 y.o. female here for concussion.   6/28: Slipped and fell 6/26 at [facility] her head on the floor, no LOC. Head and spinal CT all unremarkable.  Lumbar spine did demonstrate anterolisthesis, likely chronic and not acute.  She was diagnosed with a concussion and sent home with narcotics, muscle relaxer, and naproxen. Continues with headaches about 4x per day with phonophobia, photophobia, intermittent nausea.  Resting in a dark room with minimal to no sounds helps her headaches.  Muscle aches have worsened since her fall 2 days ago.  Mental and physical activity worsen her symptoms. No episodes of emesis but reports intermittent nausea.  No prior history of concussion.  No focal neurological deficits, acute vision changes, syncope or presyncope, or recent repeat falls since the initial event.   7/17: Patient reports she is improved compared to last visit. Frontal headache her main complaint - comes on without any specific trigger. Taking Tylenol. Also with nausea and dizziness. SCAT symptoms 22/22 with severity 76/132, improved.   8/14: Reports ~20% improvement in symptoms from last visit. Frontal and occipital headache remains, intermittently. Having nausea a couple times a week, would like a refill on Zofran. Having memory issues as well. Also has stress regarding mother now in palliative care. SCAT symptoms 22/22 with severity 83/132, worsened.   9/13: Patient reports she's overall improved. Just recently went up on toprol. Doing well with vestibular therapy. Taking zofran about once weekly. Headaches on right side main issue now. SCAT symptoms 22/22 with severity 55/132.  11/13: Patient returns with worsening symptoms compared to last visit. Worse headaches, photophobia, phonophobia. Compazine PRN for nausea. [address]  twice recently due to dizziness as well. Taking metoprolol. SCAT symptoms 22/22 with severity  96/132  02/07/22: Continuing to take metoprolol, venlafaxine. Has not refilled Compazine in a while but still has some nausea. Continues to have R sided headaches, some neck pain especially on R side.  Declines neuro at this time. Would think she'd benefit from psychotherapy. Was unable to follow up with previous referrals.  Endorses poor sleep, usually getting 4h of sleep per night. No longer taking melatonin. Does drink sodas before bed, but drinks decaf coffee during the day.  SCAT symptoms 22/22 with severity 51/132   HISTORY: Past Medical, Surgical, Social, and Family History Reviewed & Updated per EMR.   Pertinent Historical Findings include: Anxiety CHF, HTN, SVT   PHYSICAL EXAM:  VS: BP:(!) 156/96  HR: bpm  TEMP: ( )  RESP:   HT:5\' 7"  (170.2 cm)   WT:(!) 380 lb (172.4 kg)  BMI:59.5 PHYSICAL EXAM: Gen: NAD. Alert and oriented.  MSK Neck: No deformities or stepoffs. TTP on R side along trapezius. FROM.  Neuro: CN II-XII grossly intact.   ASSESSMENT & PLAN: See problem based charting & AVS for pt instructions. Postconcussion syndrome, s/p fall 06/28/21: patient feels much improved however continues to have lingering symptoms of headaches and nausea. Symptoms may be multifactorial with psychological stressors and poor sleep contributing. Advised patient on sleep hygiene including limiting sodas and foods before bed. Encouraged following up with psychology referral, or finding a provider through [email or web address].   Estevan Oaks, [facility]  Total visit time 20 minutes including documentation.

## 2022-02-08 ENCOUNTER — Telehealth: Payer: Self-pay | Admitting: Family Medicine

## 2022-02-08 ENCOUNTER — Encounter: Payer: Self-pay | Admitting: Gastroenterology

## 2022-02-08 ENCOUNTER — Encounter: Payer: Self-pay | Admitting: Family Medicine

## 2022-02-08 NOTE — Telephone Encounter (Signed)
I called patient because based on last appt with Dr Gena Fray in December he had wanted her to follow up in 4 weeks and I wanted to see if she would like to get that scheduled

## 2022-02-25 ENCOUNTER — Ambulatory Visit (INDEPENDENT_AMBULATORY_CARE_PROVIDER_SITE_OTHER): Payer: Medicare Other

## 2022-02-25 VITALS — Ht 66.5 in | Wt 360.0 lb

## 2022-02-25 DIAGNOSIS — Z0000 Encounter for general adult medical examination without abnormal findings: Secondary | ICD-10-CM

## 2022-02-25 NOTE — Patient Instructions (Signed)
[name] , Thank you for taking time to come for your Medicare Wellness Visit. I appreciate your ongoing commitment to your health goals. Please review the following plan we discussed and let me know if I can assist you in the future.   These are the goals we discussed:  Goals      Patient Stated     02/25/2022, wants to lose weight        This is a list of the screening recommended for you and due dates:  Health Maintenance  Topic Date Due   HIV Screening  Never done   Hepatitis C Screening: USPSTF Recommendation to screen - Ages 45-79 yo.  Never done   Mammogram  Never done   Zoster (Shingles) Vaccine (1 of 2) Never done   COVID-19 Vaccine (3 - 2023-24 season) 09/03/2021   Medicare Annual Wellness Visit  02/26/2023   Cologuard (Stool DNA test)  02/16/2024   Pap Smear  04/27/2024   DTaP/Tdap/Td vaccine (2 - Td or Tdap) 01/20/2031   Flu Shot  Completed   HPV Vaccine  Aged Out    Advanced directives: Advance directive discussed with you today.   Conditions/risks identified: none  Next appointment: Follow up in one year for your annual wellness visit.   Preventive Care 40-64 Years, Female Preventive care refers to lifestyle choices and visits with your health care provider that can promote health and wellness. What does preventive care include? A yearly physical exam. This is also called an annual well check. Dental exams once or twice a year. Routine eye exams. Ask your health care provider how often you should have your eyes checked. Personal lifestyle choices, including: Daily care of your teeth and gums. Regular physical activity. Eating a healthy diet. Avoiding tobacco and drug use. Limiting alcohol use. Practicing safe sex. Taking low-dose aspirin daily starting at age 68. Taking vitamin and mineral supplements as recommended by your health care provider. What happens during an annual well check? The services and screenings done by your health care provider  during your annual well check will depend on your age, overall health, lifestyle risk factors, and family history of disease. Counseling  Your health care provider may ask you questions about your: Alcohol use. Tobacco use. Drug use. Emotional well-being. Home and relationship well-being. Sexual activity. Eating habits. Work and work Statistician. Method of birth control. Menstrual cycle. Pregnancy history. Screening  You may have the following tests or measurements: Height, weight, and BMI. Blood pressure. Lipid and cholesterol levels. These may be checked every 5 years, or more frequently if you are over 30 years old. Skin check. Lung cancer screening. You may have this screening every year starting at age 40 if you have a 30-pack-year history of smoking and currently smoke or have quit within the past 15 years. Fecal occult blood test (FOBT) of the stool. You may have this test every year starting at age 37. Flexible sigmoidoscopy or colonoscopy. You may have a sigmoidoscopy every 5 years or a colonoscopy every 10 years starting at age 15. Hepatitis C blood test. Hepatitis B blood test. Sexually transmitted disease (STD) testing. Diabetes screening. This is done by checking your blood sugar (glucose) after you have not eaten for a while (fasting). You may have this done every 1-3 years. Mammogram. This may be done every 1-2 years. Talk to your health care provider about when you should start having regular mammograms. This may depend on whether you have a family history of breast cancer.  BRCA-related cancer screening. This may be done if you have a family history of breast, ovarian, tubal, or peritoneal cancers. Pelvic exam and Pap test. This may be done every 3 years starting at age 70. Starting at age 43, this may be done every 5 years if you have a Pap test in combination with an HPV test. Bone density scan. This is done to screen for osteoporosis. You may have this scan if you are  at high risk for osteoporosis. Discuss your test results, treatment options, and if necessary, the need for more tests with your health care provider. Vaccines  Your health care provider may recommend certain vaccines, such as: Influenza vaccine. This is recommended every year. Tetanus, diphtheria, and acellular pertussis (Tdap, Td) vaccine. You may need a Td booster every 10 years. Zoster vaccine. You may need this after age 61. Pneumococcal 13-valent conjugate (PCV13) vaccine. You may need this if you have certain conditions and were not previously vaccinated. Pneumococcal polysaccharide (PPSV23) vaccine. You may need one or two doses if you smoke cigarettes or if you have certain conditions. Talk to your health care provider about which screenings and vaccines you need and how often you need them. This information is not intended to replace advice given to you by your health care provider. Make sure you discuss any questions you have with your health care provider. Document Released: 01/16/2015 Document Revised: 09/09/2015 Document Reviewed: 10/21/2014 Elsevier Interactive Patient Education  2017 [place] Prevention in the Home Falls can cause injuries. They can happen to people of all ages. There are many things you can do to make your home safe and to help prevent falls. What can I do on the outside of my home? Regularly fix the edges of walkways and driveways and fix any cracks. Remove anything that might make you trip as you walk through a door, such as a raised step or threshold. Trim any bushes or trees on the path to your home. Use bright outdoor lighting. Clear any walking paths of anything that might make someone trip, such as rocks or tools. Regularly check to see if handrails are loose or broken. Make sure that both sides of any steps have handrails. Any raised decks and porches should have guardrails on the edges. Have any leaves, snow, or ice cleared  regularly. Use sand or salt on walking paths during winter. Clean up any spills in your garage right away. This includes oil or grease spills. What can I do in the bathroom? Use night lights. Install grab bars by the toilet and in the tub and shower. Do not use towel bars as grab bars. Use non-skid mats or decals in the tub or shower. If you need to sit down in the shower, use a plastic, non-slip stool. Keep the floor dry. Clean up any water that spills on the floor as soon as it happens. Remove soap buildup in the tub or shower regularly. Attach bath mats securely with double-sided non-slip rug tape. Do not have throw rugs and other things on the floor that can make you trip. What can I do in the bedroom? Use night lights. Make sure that you have a light by your bed that is easy to reach. Do not use any sheets or blankets that are too big for your bed. They should not hang down onto the floor. Have a firm chair that has side arms. You can use this for support while you get dressed. Do not have  throw rugs and other things on the floor that can make you trip. What can I do in the kitchen? Clean up any spills right away. Avoid walking on wet floors. Keep items that you use a lot in easy-to-reach places. If you need to reach something above you, use a strong step stool that has a grab bar. Keep electrical cords out of the way. Do not use floor polish or wax that makes floors slippery. If you must use wax, use non-skid floor wax. Do not have throw rugs and other things on the floor that can make you trip. What can I do with my stairs? Do not leave any items on the stairs. Make sure that there are handrails on both sides of the stairs and use them. Fix handrails that are broken or loose. Make sure that handrails are as long as the stairways. Check any carpeting to make sure that it is firmly attached to the stairs. Fix any carpet that is loose or worn. Avoid having throw rugs at the top or  bottom of the stairs. If you do have throw rugs, attach them to the floor with carpet tape. Make sure that you have a light switch at the top of the stairs and the bottom of the stairs. If you do not have them, ask someone to add them for you. What else can I do to help prevent falls? Wear shoes that: Do not have high heels. Have rubber bottoms. Are comfortable and fit you well. Are closed at the toe. Do not wear sandals. If you use a stepladder: Make sure that it is fully opened. Do not climb a closed stepladder. Make sure that both sides of the stepladder are locked into place. Ask someone to hold it for you, if possible. Clearly mark and make sure that you can see: Any grab bars or handrails. First and last steps. Where the edge of each step is. Use tools that help you move around (mobility aids) if they are needed. These include: Canes. Walkers. Scooters. Crutches. Turn on the lights when you go into a dark area. Replace any light bulbs as soon as they burn out. Set up your furniture so you have a clear path. Avoid moving your furniture around. If any of your floors are uneven, fix them. If there are any pets around you, be aware of where they are. Review your medicines with your doctor. Some medicines can make you feel dizzy. This can increase your chance of falling. Ask your doctor what other things that you can do to help prevent falls. This information is not intended to replace advice given to you by your health care provider. Make sure you discuss any questions you have with your health care provider. Document Released: 10/16/2008 Document Revised: 05/28/2015 Document Reviewed: 01/24/2014 Elsevier Interactive Patient Education  2017 [employer].

## 2022-02-25 NOTE — Progress Notes (Signed)
I connected with  Benard Rink on 02/25/22 by a audio enabled telemedicine application and verified that I am speaking with the correct person using two identifiers.  Patient Location: Home  Provider Location: Office/Clinic  I discussed the limitations of evaluation and management by telemedicine. The patient expressed understanding and agreed to proceed.  Subjective:   Katherine Parker is a 57 y.o. female who presents for Medicare Annual (Subsequent) preventive examination.  Review of Systems     Cardiac Risk Factors include: advanced age (>34mn, >>80women);dyslipidemia;hypertension;obesity (BMI >30kg/m2)     Objective:    Today's Vitals   02/25/22 0811 02/25/22 0812  Weight: (!) 360 lb (163.3 kg)   Height: 5' 6.5" (1.689 m)   PainSc:  6    Body mass index is 57.24 kg/m.     02/25/2022    8:17 AM 11/26/2021   10:22 AM 02/23/2021    8:23 AM 11/05/2020   12:12 PM 10/06/2020    4:31 PM 08/22/2020    7:45 PM 02/15/2020    7:22 PM  Advanced Directives  Does Patient Have a Medical Advance Directive? No No No No No No No  Would patient like information on creating a medical advance directive?  Yes (ED - Information included in AVS) No - Patient declined  No - Patient declined  No - Patient declined    Current Medications (verified) Outpatient Encounter Medications as of 02/25/2022  Medication Sig   acetaminophen-codeine (TYLENOL #3) 300-30 MG tablet Take 1 tablet by mouth every 4 (four) hours as needed for moderate pain.   albuterol (PROVENTIL) (2.5 MG/3ML) 0.083% nebulizer solution Take 3 mLs (2.5 mg total) by nebulization every 6 (six) hours as needed for wheezing or shortness of breath.   albuterol (VENTOLIN HFA) 108 (90 Base) MCG/ACT inhaler Inhale 1-2 puffs into the lungs every 6 (six) hours as needed for wheezing or shortness of breath.   ibuprofen (ADVIL) 200 MG tablet Take 400 mg by mouth every 6 (six) hours as needed for mild pain.   metoprolol succinate (TOPROL XL) 50 MG 24  hr tablet Take 0.5 tablets (25 mg total) by mouth daily.   prochlorperazine (COMPAZINE) 5 MG tablet Take 1 tablet (5 mg total) by mouth every 6 (six) hours as needed for nausea or vomiting.   terbinafine (LAMISIL) 250 MG tablet Take 1 tablet (250 mg total) by mouth daily.   venlafaxine XR (EFFEXOR XR) 75 MG 24 hr capsule Take 1 capsule (75 mg total) by mouth daily with breakfast. Take togther with 150 mg dose (total dose= 225 mg)   venlafaxine XR (EFFEXOR-XR) 150 MG 24 hr capsule Take 1 capsule (150 mg total) by mouth daily with breakfast. Take together with 75 mg dose (total dose= 225 mg)   Melatonin 10 MG TABS Take 10 mg by mouth at bedtime as needed (sleep). (Patient not taking: Reported on 02/25/2022)   No facility-administered encounter medications on file as of 02/25/2022.    Allergies (verified) Penicillin g and Penicillins   History: Past Medical History:  Diagnosis Date   Anxiety    CHF (congestive heart failure) (HCC)    Hypertension    SVT (supraventricular tachycardia)    Past Surgical History:  Procedure Laterality Date   CESAREAN SECTION     GUM SURGERY     TUBAL LIGATION     Family History  Problem Relation Age of Onset   Heart disease Mother    COPD Mother    Congestive Heart Failure Mother  Hypertension Mother    Diabetes Mother    Dementia Father    Diabetes Maternal Grandmother    Colon cancer Maternal Grandmother    Dementia Paternal Grandmother    Dementia Paternal Grandfather    Cancer Half-Sister        Breast, brain metastasis   Cancer Half-Sister        Uterine   Social History   Socioeconomic History   Marital status: Single    Spouse name: Not on file   Number of children: 2   Years of education: Not on file   Highest education level: High school graduate  Occupational History   Occupation: Unemployed  Tobacco Use   Smoking status: Never   Smokeless tobacco: Never  Vaping Use   Vaping Use: Never used  Substance and Sexual Activity    Alcohol use: Never   Drug use: Yes    Types: Codeine   Sexual activity: Not Currently  Other Topics Concern   Not on file  Social History Narrative   Not on file   Social Determinants of Health   Financial Resource Strain: Low Risk  (02/25/2022)   Overall Financial Resource Strain (CARDIA)    Difficulty of Paying Living Expenses: Not hard at all  Food Insecurity: No Food Insecurity (02/25/2022)   Hunger Vital Sign    Worried About Running Out of Food in the Last Year: Never true    Ran Out of Food in the Last Year: Never true  Transportation Needs: No Transportation Needs (02/25/2022)   PRAPARE - Hydrologist (Medical): No    Lack of Transportation (Non-Medical): No  Physical Activity: Inactive (02/25/2022)   Exercise Vital Sign    Days of Exercise per Week: 0 days    Minutes of Exercise per Session: 0 min  Stress: Stress Concern Present (02/25/2022)   [place]    Feeling of Stress : To some extent  Social Connections: Moderately Isolated (02/23/2021)   Social Connection and Isolation Panel [NHANES]    Frequency of Communication with Friends and Family: Twice a week    Frequency of Social Gatherings with Friends and Family: Twice a week    Attends Religious Services: More than 4 times per year    Active Member of [employer] or Organizations: No    Attends Music therapist: Never    Marital Status: Divorced    Tobacco Counseling Counseling given: Not Answered   Clinical Intake:  Pre-visit preparation completed: Yes  Pain : 0-10 Pain Score: 6  Pain Type: Chronic pain Pain Location: Neck Pain Descriptors / Indicators: Aching Pain Onset: More than a month ago Pain Frequency: Constant     Nutritional Status: BMI > 30  Obese Nutritional Risks: None Diabetes: No  How often do you need to have someone help you when you read instructions, pamphlets, or other written  materials from your doctor or pharmacy?: 1 - Never  Diabetic? no  Interpreter Needed?: No  Information entered by :: NAllen LPN   Activities of Daily Living    02/25/2022    8:19 AM  In your present state of health, do you have any difficulty performing the following activities:  Hearing? 0  Vision? 0  Difficulty concentrating or making decisions? 1  Walking or climbing stairs? 1  Dressing or bathing? 0  Doing errands, shopping? 0  Preparing Food and eating ? N  Using the Toilet? N  In the  past six months, have you accidently leaked urine? N  Do you have problems with loss of bowel control? N  Managing your Medications? N  Managing your Finances? N  Housekeeping or managing your Housekeeping? N    Patient Care Team: Haydee Salter, MD as PCP - General (Family Medicine) Stanford Breed Denice Bors, MD as Consulting Physician (Cardiology) Marzetta Board, DPM as Consulting Physician (Podiatry)  Indicate any recent Medical Services you may have received from other than [facility] providers in the past year (date may be approximate).     Assessment:   This is a routine wellness examination for Tanda.  Hearing/Vision screen Vision Screening - Comments:: No regular eye exams  Dietary issues and exercise activities discussed: Current Exercise Habits: The patient does not participate in regular exercise at present   Goals Addressed             This Visit's Progress    Patient Stated       02/25/2022, wants to lose weight       Depression Screen    02/25/2022    8:18 AM 12/20/2021    2:14 PM 07/27/2021    9:35 AM 04/27/2021    9:21 AM 02/23/2021    8:25 AM 02/23/2021    8:22 AM 01/19/2021   10:22 AM  PHQ 2/9 Scores  PHQ - 2 Score 0 '3 3 4 '$ 0 0 0  PHQ- 9 Score  '13 15 14   9    '$ Fall Risk    02/25/2022    8:17 AM 02/23/2021    8:25 AM  Fall Risk   Falls in the past year? 1 0  Comment was getting lightheaded   Number falls in past yr: 1 0  Injury with Fall? 1 0  Comment  headaches since fall   Risk for fall due to : Medication side effect No Fall Risks  Follow up Falls prevention discussed;Education provided;Falls evaluation completed Falls evaluation completed    FALL RISK PREVENTION PERTAINING TO THE HOME:  Any stairs in or around the home? Yes  If so, are there any without handrails? No  Home free of loose throw rugs in walkways, pet beds, electrical cords, etc? Yes  Adequate lighting in your home to reduce risk of falls? Yes   ASSISTIVE DEVICES UTILIZED TO PREVENT FALLS:  Life alert? No  Use of a cane, walker or w/c? No  Grab bars in the bathroom? No  Shower chair or bench in shower? No  Elevated toilet seat or a handicapped toilet? Yes   TIMED UP AND GO:  Was the test performed? No .      Cognitive Function:        02/25/2022    8:20 AM  6CIT Screen  What Year? 0 points  What month? 0 points  What time? 0 points  Count back from 20 0 points  Months in reverse 4 points  Repeat phrase 6 points  Total Score 10 points    Immunizations Immunization History  Administered Date(s) Administered   Influenza,inj,Quad PF,6+ Mos 10/13/2020, 09/27/2021   PFIZER(Purple Top)SARS-COV-2 Vaccination 03/04/2019, 04/02/2019   PNEUMOCOCCAL CONJUGATE-20 01/19/2021   Tdap 01/19/2021    TDAP status: Up to date  Flu Vaccine status: Up to date  Pneumococcal vaccine status: Up to date  Covid-19 vaccine status: Completed vaccines  Qualifies for Shingles Vaccine? Yes   Zostavax completed No   Shingrix Completed?: No.    Education has been provided regarding the importance of this vaccine.  Patient has been advised to call insurance company to determine out of pocket expense if they have not yet received this vaccine. Advised may also receive vaccine at local pharmacy or Health Dept. Verbalized acceptance and understanding.  Screening Tests Health Maintenance  Topic Date Due   HIV Screening  Never done   Hepatitis C Screening  Never done    MAMMOGRAM  Never done   Zoster Vaccines- Shingrix (1 of 2) Never done   COVID-19 Vaccine (3 - 2023-24 season) 09/03/2021   Medicare Annual Wellness (AWV)  02/23/2022   Fecal DNA (Cologuard)  02/16/2024   PAP SMEAR-Modifier  04/27/2024   DTaP/Tdap/Td (2 - Td or Tdap) 01/20/2031   INFLUENZA VACCINE  Completed   HPV VACCINES  Aged Out    Health Maintenance  Health Maintenance Due  Topic Date Due   HIV Screening  Never done   Hepatitis C Screening  Never done   MAMMOGRAM  Never done   Zoster Vaccines- Shingrix (1 of 2) Never done   COVID-19 Vaccine (3 - 2023-24 season) 09/03/2021   Medicare Annual Wellness (AWV)  02/23/2022    Colorectal cancer screening: Type of screening: Cologuard. Completed 02/15/2021. Repeat every 3 years  Mammogram status: scheduled for next month  Bone Density status: n/a  Lung Cancer Screening: (Low Dose CT Chest recommended if Age 77-80 years, 30 pack-year currently smoking OR have quit w/in 15years.) does not qualify.   Lung Cancer Screening Referral: no  Additional Screening:  Hepatitis C Screening: does qualify;   Vision Screening: Recommended annual ophthalmology exams for early detection of glaucoma and other disorders of the eye. Is the patient up to date with their annual eye exam?  No  Who is the provider or what is the name of the office in which the patient attends annual eye exams? none If pt is not established with a provider, would they like to be referred to a provider to establish care? No .   Dental Screening: Recommended annual dental exams for proper oral hygiene  Community Resource Referral / Chronic Care Management: CRR required this visit?  No   CCM required this visit?  No      Plan:     I have personally reviewed and noted the following in the patient's chart:   Medical and social history Use of alcohol, tobacco or illicit drugs  Current medications and supplements including opioid prescriptions. Patient is  currently taking opioid prescriptions. Information provided to patient regarding non-opioid alternatives. Patient advised to discuss non-opioid treatment plan with their provider. Functional ability and status Nutritional status Physical activity Advanced directives List of other physicians Hospitalizations, surgeries, and ER visits in previous 12 months Vitals Screenings to include cognitive, depression, and falls Referrals and appointments  In addition, I have reviewed and discussed with patient certain preventive protocols, quality metrics, and best practice recommendations. A written personalized care plan for preventive services as well as general preventive health recommendations were provided to patient.     Kellie Simmering, LPN   [id number]   Nurse Notes: none  Due to this being a virtual visit, the after visit summary with patients personalized plan was offered to patient via mail or my-chart. Patient would like to access on my-chart

## 2022-03-03 ENCOUNTER — Ambulatory Visit: Payer: Medicare Other | Admitting: Family Medicine

## 2022-03-14 ENCOUNTER — Ambulatory Visit: Payer: Medicare Other | Admitting: Family Medicine

## 2022-03-14 ENCOUNTER — Telehealth: Payer: Self-pay | Admitting: Family Medicine

## 2022-03-14 NOTE — Telephone Encounter (Signed)
Pt was a no show for an OV with Dr Gena Fray on 03/14/22, I did not send a no show letter.

## 2022-03-15 NOTE — Telephone Encounter (Signed)
No show 10/14/2021 No show 12/02/2021 No show 03/14/2022  Please advise if you would like to proceed with dismissal.

## 2022-03-16 NOTE — Telephone Encounter (Signed)
LVM for pt to call and reschedule her visit.  I will mail and send final warning via mychart.

## 2022-03-16 NOTE — Telephone Encounter (Signed)
Pt called back. Appt scheduled for 3/18 10:00a. Pt apologized for missing her appt and said Dr. Gena Fray is such a special doctor. She said she called and was 4th on hold and had to hang up and forgot to call back to reschedule the 3/11 visit.

## 2022-03-21 ENCOUNTER — Encounter: Payer: Self-pay | Admitting: Family Medicine

## 2022-03-21 ENCOUNTER — Other Ambulatory Visit: Payer: Self-pay

## 2022-03-21 ENCOUNTER — Ambulatory Visit (INDEPENDENT_AMBULATORY_CARE_PROVIDER_SITE_OTHER): Payer: Medicare Other | Admitting: Family Medicine

## 2022-03-21 VITALS — BP 134/80 | HR 72 | Temp 97.6°F | Ht 66.5 in | Wt 343.6 lb

## 2022-03-21 DIAGNOSIS — G8929 Other chronic pain: Secondary | ICD-10-CM

## 2022-03-21 DIAGNOSIS — J4520 Mild intermittent asthma, uncomplicated: Secondary | ICD-10-CM | POA: Diagnosis not present

## 2022-03-21 DIAGNOSIS — M5441 Lumbago with sciatica, right side: Secondary | ICD-10-CM | POA: Diagnosis not present

## 2022-03-21 DIAGNOSIS — I10 Essential (primary) hypertension: Secondary | ICD-10-CM | POA: Diagnosis not present

## 2022-03-21 DIAGNOSIS — Z6841 Body Mass Index (BMI) 40.0 and over, adult: Secondary | ICD-10-CM

## 2022-03-21 DIAGNOSIS — F418 Other specified anxiety disorders: Secondary | ICD-10-CM

## 2022-03-21 MED ORDER — BUSPIRONE HCL 5 MG PO TABS: 5.0000 mg | ORAL_TABLET | Freq: Two times a day (BID) | ORAL | 3 refills | Status: DC

## 2022-03-21 MED ORDER — ACETAMINOPHEN-CODEINE 300-30 MG PO TABS: 1.0000 | ORAL_TABLET | ORAL | 0 refills | Status: DC | PRN

## 2022-03-21 NOTE — Patient Instructions (Signed)
[facility]

## 2022-03-21 NOTE — Progress Notes (Signed)
[facility] [address] [place] [address] Dept: [phone] Dept Fax: [phone]  Chronic Care Office Visit  Subjective:    Patient ID: Sarenity Haitz, female    DOB: [birth year], 57 y.o..   MRN: [id number]  Chief Complaint  Patient presents with   Medical Management of Chronic Issues    F/u meds. C/o anxiety meds.    History of Present Illness:  Patient is in today for reassessment of chronic medical issues.  Ms. Welson has a history of hypertension. She is managed on metoprolol 25 mg daily for this and for a history of SVT.   Ms. Thornes has a history of mild intermittent asthma. She uses an albuterol inhaler for this. She had a flare of asthma this winter, but this is improved at present.   Ms. Dalal continues to express concern over ongoing weight gain. She is interested in medication management,. but is not able to afford some of the options not covered by her insurance.   Ms. Oppermann has a history of depression. She is managed on venlafaxine XR 225 mg daily. She still notes issues with anxiety and depression. One of her current worries has been for the status of her sister, who is currently in a nursing home up [place]. She would like to bring her back to [place] to live with her, but is meeting barriers from her niece, who has Power of [employer]. Ms. Widmer also notes ongoing grief over her mother's death.  Past Medical History: Patient Active Problem List   Diagnosis Date Noted   Chronic constipation 12/08/2021   History of bulimia nervosa 12/08/2021   Vitiligo 07/27/2021   Obstructive sleep apnea 03/24/2021   Stutter 03/24/2021   Paroxysmal SVT (supraventricular tachycardia) 11/18/2020   Normocytic anemia 10/20/2020   Spondylolisthesis at L5-S1 level 10/20/2020   Prediabetes 10/20/2020   Hypertrophic cardiomyopathy ([place]) 10/13/2020   Morbid obesity with BMI of 50.0-59.9, adult ([place]) 10/13/2020   Essential  hypertension 10/13/2020   Chronic right-sided low back pain with right-sided sciatica 10/13/2020   Borderline hyperlipidemia 03/09/2020   Insomnia 03/09/2020   Mild intermittent asthma 03/09/2020   Moderate recurrent major depression ([place] [place]) 03/09/2020   Past Surgical History:  Procedure Laterality Date   CESAREAN SECTION     GUM SURGERY     TUBAL LIGATION     Family History  Problem Relation Age of Onset   Heart disease Mother    COPD Mother    Congestive Heart Failure Mother    Hypertension Mother    Diabetes Mother    Dementia Father    Diabetes Maternal Grandmother    Colon cancer Maternal Grandmother    Dementia Paternal Grandmother    Dementia Paternal Grandfather    Cancer Half-Sister        Breast, brain metastasis   Cancer Half-Sister        Uterine   Outpatient Medications Prior to Visit  Medication Sig Dispense Refill   albuterol (PROVENTIL) (2.5 MG/3ML) 0.083% nebulizer solution Take 3 mLs (2.5 mg total) by nebulization every 6 (six) hours as needed for wheezing or shortness of breath. 75 mL 12   albuterol (VENTOLIN HFA) 108 (90 Base) MCG/ACT inhaler Inhale 1-2 puffs into the lungs every 6 (six) hours as needed for wheezing or shortness of breath. 18 g 0   ibuprofen (ADVIL) 200 MG tablet Take 400 mg by mouth every 6 (six) hours as needed for mild pain.     Melatonin 10 MG  TABS Take 10 mg by mouth at bedtime as needed (sleep).     metoprolol succinate (TOPROL XL) 50 MG 24 hr tablet Take 0.5 tablets (25 mg total) by mouth daily. 90 tablet 3   prochlorperazine (COMPAZINE) 5 MG tablet Take 1 tablet (5 mg total) by mouth every 6 (six) hours as needed for nausea or vomiting. 30 tablet 0   terbinafine (LAMISIL) 250 MG tablet Take 1 tablet (250 mg total) by mouth daily. 90 tablet 0   venlafaxine XR (EFFEXOR XR) 75 MG 24 hr capsule Take 1 capsule (75 mg total) by mouth daily with breakfast. Take togther with 150 mg dose (total dose= 225 mg) 90 capsule 3   venlafaxine XR  (EFFEXOR-XR) 150 MG 24 hr capsule Take 1 capsule (150 mg total) by mouth daily with breakfast. Take together with 75 mg dose (total dose= 225 mg) 90 capsule 3   acetaminophen-codeine (TYLENOL #3) 300-30 MG tablet Take 1 tablet by mouth every 4 (four) hours as needed for moderate pain. (Patient not taking: Reported on 03/21/2022) 10 tablet 0   No facility-administered medications prior to visit.   Allergies  Allergen Reactions   Penicillin G     Other reaction(s): Unknown   Penicillins     Did it involve swelling of the face/tongue/throat, SOB, or low BP? Unknown Did it involve sudden or severe rash/hives, skin peeling, or any reaction on the inside of your mouth or nose? Unknown Did you need to seek medical attention at a hospital or doctor's office? Unknown When did it last happen?  Childhood    If all above answers are "NO", may proceed with cephalosporin use.     Objective:   Today's Vitals   03/21/22 0956  BP: 134/80  Pulse: 72  Temp: 97.6 F (36.4 C)  TempSrc: Temporal  SpO2: 97%  Weight: (!) 343 lb 9.6 oz (155.9 kg)  Height: 5' 6.5" (1.689 m)   Body mass index is 54.63 kg/m.   General: Well developed, well nourished. No acute distress. Lungs: Clear to auscultation bilaterally. No wheezing, rales or rhonchi. Psych: Alert and oriented. Normal mood and affect.  Health Maintenance Due  Topic Date Due   HIV Screening  Never done   Hepatitis C Screening  Never done   MAMMOGRAM  Never done   Zoster Vaccines- Shingrix (1 of 2) Never done     Assessment & Plan:   Problem List Items Addressed This Visit       Cardiovascular and Mediastinum   Essential hypertension - Primary    BP is adequately controlled. Continue metoprolol succinate 50 mg daily.        Respiratory   Mild intermittent asthma    Well controlled. Continue as needed albuterol.        Nervous and Auditory   Chronic right-sided low back pain with right-sided sciatica    Stable. Using 2-3 Tylenol  #3 each week.      Relevant Medications   acetaminophen-codeine (TYLENOL #3) 300-30 MG tablet   busPIRone (BUSPAR) 5 MG tablet     Other   Morbid obesity with BMI of 50.0-59.9, adult ([place])    Discussed options to potentially access a compounded GLP1 agent.      Other Visit Diagnoses     Depression with anxiety       I will start her on buspirone 5 mg bid as an adjunct to the venlafaxine 225 mg dialy.   Relevant Medications   busPIRone (BUSPAR) 5 MG tablet  Return in about 4 weeks (around 04/18/2022) for Reassessment.   Haydee Salter, MD

## 2022-03-21 NOTE — Assessment & Plan Note (Signed)
Stable. Using 2-3 Tylenol #3 each week.

## 2022-03-21 NOTE — Assessment & Plan Note (Signed)
Well-controlled.  Continue as needed albuterol 

## 2022-03-21 NOTE — Assessment & Plan Note (Signed)
BP is adequately controlled. Continue metoprolol succinate 50 mg daily.

## 2022-03-21 NOTE — Assessment & Plan Note (Signed)
Discussed options to potentially access a compounded GLP1 agent.

## 2022-03-21 NOTE — Telephone Encounter (Signed)
Received notification that Tylenol-codeine #3 is on long term back order. Please send to the [facility] per patient.  Thanks. Dm/cma

## 2022-04-03 ENCOUNTER — Other Ambulatory Visit: Payer: Self-pay | Admitting: Family Medicine

## 2022-04-03 DIAGNOSIS — F418 Other specified anxiety disorders: Secondary | ICD-10-CM

## 2022-04-04 MED ORDER — BUSPIRONE HCL 5 MG PO TABS: 5.0000 mg | ORAL_TABLET | Freq: Two times a day (BID) | ORAL | 2 refills | Status: DC

## 2022-04-04 NOTE — Addendum Note (Signed)
Addended by: Haydee Salter on: 04/04/2022 02:15 PM   Modules accepted: Orders

## 2022-04-11 ENCOUNTER — Ambulatory Visit: Payer: Medicare Other | Admitting: Gastroenterology

## 2022-04-18 ENCOUNTER — Encounter: Payer: Self-pay | Admitting: Family Medicine

## 2022-04-18 ENCOUNTER — Ambulatory Visit (INDEPENDENT_AMBULATORY_CARE_PROVIDER_SITE_OTHER): Payer: Medicare Other | Admitting: Family Medicine

## 2022-04-18 VITALS — BP 130/84 | HR 83 | Temp 98.3°F | Ht 66.5 in | Wt 346.2 lb

## 2022-04-18 DIAGNOSIS — I422 Other hypertrophic cardiomyopathy: Secondary | ICD-10-CM

## 2022-04-18 DIAGNOSIS — I10 Essential (primary) hypertension: Secondary | ICD-10-CM

## 2022-04-18 DIAGNOSIS — J4521 Mild intermittent asthma with (acute) exacerbation: Secondary | ICD-10-CM | POA: Diagnosis not present

## 2022-04-18 DIAGNOSIS — F331 Major depressive disorder, recurrent, moderate: Secondary | ICD-10-CM

## 2022-04-18 MED ORDER — ALBUTEROL SULFATE HFA 108 (90 BASE) MCG/ACT IN AERS: 1.0000 | INHALATION_SPRAY | Freq: Four times a day (QID) | RESPIRATORY_TRACT | 0 refills | Status: DC | PRN

## 2022-04-18 MED ORDER — CETIRIZINE HCL 10 MG PO TABS: 10.0000 mg | ORAL_TABLET | Freq: Every day | ORAL | 11 refills | Status: DC

## 2022-04-18 MED ORDER — PREDNISONE 20 MG PO TABS: 20.0000 mg | ORAL_TABLET | Freq: Every day | ORAL | 0 refills | Status: DC

## 2022-04-18 NOTE — Assessment & Plan Note (Signed)
Currently is having a flare. I will prescribe a 1-week course of prednisone. Discussed using her albuterol every 4-6 hours for 48 hours and then back to as needed.

## 2022-04-18 NOTE — Assessment & Plan Note (Signed)
BP is adequately controlled, though she may benefit from further lowering. I would consider adding an ACE-I in light of her cardiomyopathy. We will monitor for now.

## 2022-04-18 NOTE — Assessment & Plan Note (Signed)
Ms. Vanweelden is having some DOE and has mild edema. I will refer her back to Dr. Jens Som for ongoing monitoring for any progressive heart failure.

## 2022-04-18 NOTE — Assessment & Plan Note (Signed)
Katherine Parker continues to struggle with stressors which feed her depression. I allowed her to discuss her stress and provided empathy. We will continue her on venlafaxine 225 mg daily. I will see her again in 1 month.

## 2022-04-18 NOTE — Progress Notes (Signed)
[facility] [address] [place] [address] Dept: [phone] Dept Fax: [phone]  Chronic Care Office Visit  Subjective:    Patient ID: Katherine Parker, female    DOB: Sep 14, 1965, 57 y.o..   MRN: [id number]  Chief Complaint  Patient presents with   Medical Management of Chronic Issues    4 week f/u.  C/o having cough, SOB x 1 week.  Have been taken Robitussin DM.    History of Present Illness:  Patient is in today for reassessment of chronic medical issues.  Katherine Parker has a history of hypertension. She is managed on metoprolol 25 mg daily for this and for a history of SVT. She had been oted to have some hypertrophic cardiomyopathy with [facility] on an echocardiogram in 2022. She Admits to some dyspnea with exertion. She is unsure if her legs are more swollen than normal.   Katherine Parker has a history of mild intermittent asthma. She uses an albuterol inhaler for this. She has been having some recent increased cough and chest tightness. She also admits to nasal congestion and puffy eyes at times. She is not on any specific allergy meds. She does use albuterol inhaler/nebs as needed.   Katherine Parker has a history of depression. She is managed on venlafaxine XR 225 mg daily. She still notes issues with anxiety and depression. IN addition to other family issues and grief, her daughter is currently admitted to [facility] with severe Crohn's disease. I had tried prescribign Buspar at her last visit, but [name] never took this.  Past Medical History: Patient Active Problem List   Diagnosis Date Noted   Chronic constipation 12/08/2021   History of bulimia nervosa 12/08/2021   Vitiligo 07/27/2021   Obstructive sleep apnea 03/24/2021   Stutter 03/24/2021   Paroxysmal SVT (supraventricular tachycardia) 11/18/2020   Normocytic anemia 10/20/2020   Spondylolisthesis at L5-S1 level 10/20/2020   Prediabetes 10/20/2020   Hypertrophic cardiomyopathy  10/13/2020   Morbid obesity with BMI of 50.0-59.9, adult 10/13/2020   Essential hypertension 10/13/2020   Chronic right-sided low back pain with right-sided sciatica 10/13/2020   Borderline hyperlipidemia 03/09/2020   Insomnia 03/09/2020   Mild intermittent asthma 03/09/2020   Moderate recurrent major depression 03/09/2020   Past Surgical History:  Procedure Laterality Date   CESAREAN SECTION     GUM SURGERY     TUBAL LIGATION     Family History  Problem Relation Age of Onset   Heart disease Mother    COPD Mother    Congestive Heart Failure Mother    Hypertension Mother    Diabetes Mother    Dementia Father    Diabetes Maternal Grandmother    Colon cancer Maternal Grandmother    Dementia Paternal Grandmother    Dementia Paternal Grandfather    Cancer Half-Sister        Breast, brain metastasis   Cancer Half-Sister        Uterine   Outpatient Medications Prior to Visit  Medication Sig Dispense Refill   acetaminophen-codeine (TYLENOL #3) 300-30 MG tablet Take 1 tablet by mouth every 4 (four) hours as needed for moderate pain. 10 tablet 0   albuterol (PROVENTIL) (2.5 MG/3ML) 0.083% nebulizer solution Take 3 mLs (2.5 mg total) by nebulization every 6 (six) hours as needed for wheezing or shortness of breath. 75 mL 12   busPIRone (BUSPAR) 5 MG tablet Take 1 tablet (5 mg total) by mouth 2 (two) times daily. 180 tablet 2  ibuprofen (ADVIL) 200 MG tablet Take 400 mg by mouth every 6 (six) hours as needed for mild pain.     Melatonin 10 MG TABS Take 10 mg by mouth at bedtime as needed (sleep).     metoprolol succinate (TOPROL XL) 50 MG 24 hr tablet Take 0.5 tablets (25 mg total) by mouth daily. 90 tablet 3   prochlorperazine (COMPAZINE) 5 MG tablet Take 1 tablet (5 mg total) by mouth every 6 (six) hours as needed for nausea or vomiting. 30 tablet 0   terbinafine (LAMISIL) 250 MG tablet Take 1 tablet (250 mg total) by mouth daily. 90 tablet 0   venlafaxine XR (EFFEXOR XR) 75 MG 24 hr  capsule Take 1 capsule (75 mg total) by mouth daily with breakfast. Take togther with 150 mg dose (total dose= 225 mg) 90 capsule 3   venlafaxine XR (EFFEXOR-XR) 150 MG 24 hr capsule Take 1 capsule (150 mg total) by mouth daily with breakfast. Take together with 75 mg dose (total dose= 225 mg) 90 capsule 3   albuterol (VENTOLIN HFA) 108 (90 Base) MCG/ACT inhaler Inhale 1-2 puffs into the lungs every 6 (six) hours as needed for wheezing or shortness of breath. 18 g 0   No facility-administered medications prior to visit.   Allergies  Allergen Reactions   Penicillin G     Other reaction(s): Unknown   Penicillins     Did it involve swelling of the face/tongue/throat, SOB, or low BP? Unknown Did it involve sudden or severe rash/hives, skin peeling, or any reaction on the inside of your mouth or nose? Unknown Did you need to seek medical attention at a hospital or doctor's office? Unknown When did it last happen?  Childhood    If all above answers are "NO", may proceed with cephalosporin use.     Objective:   Today's Vitals   04/18/22 0921  BP: 130/84  Pulse: 83  Temp: 98.3 F (36.8 C)  TempSrc: Temporal  SpO2: 97%  Weight: (!) 346 lb 3.2 oz (157 kg)  Height: 5' 6.5" (1.689 m)   Body mass index is 55.04 kg/m.   General: Well developed, well nourished. No acute distress. Lungs: Prolonged expiratory phase to breathing. +/- wheezing. No rhonchi or rales. Extremities: Trace to 1+ edema noted. Psych: Alert and oriented. Normal mood and affect.  Health Maintenance Due  Topic Date Due   HIV Screening  Never done   Hepatitis C Screening  Never done   MAMMOGRAM  Never done     Assessment & Plan:   Problem List Items Addressed This Visit       Cardiovascular and Mediastinum   Hypertrophic cardiomyopathy    [name] is having some DOE and has mild edema. I will refer her back to Dr. Jens Som for ongoing monitoring for any progressive heart failure.      Relevant Orders    Ambulatory referral to Cardiology   Essential hypertension - Primary    BP is adequately controlled, though she may benefit from further lowering. I would consider adding an ACE-I in light of her cardiomyopathy. We will monitor for now.        Respiratory   Mild intermittent asthma    Currently is having a flare. I will prescribe a 1-week course of prednisone. Discussed using her albuterol every 4-6 hours for 48 hours and then back to as needed.      Relevant Medications   predniSONE (DELTASONE) 20 MG tablet   albuterol (VENTOLIN HFA)  108 (90 Base) MCG/ACT inhaler   cetirizine (ZYRTEC) 10 MG tablet     Other   Moderate recurrent major depression    Ms. Stinson continues to struggle with stressors which feed her depression. I allowed her to discuss her stress and provided empathy. We will continue her on venlafaxine 225 mg daily. I will see her again in 1 month.       Return in about 4 weeks (around 05/16/2022) for Reassessment.   Loyola Mast, MD

## 2022-05-12 ENCOUNTER — Other Ambulatory Visit: Payer: Self-pay | Admitting: Family Medicine

## 2022-05-12 DIAGNOSIS — J4521 Mild intermittent asthma with (acute) exacerbation: Secondary | ICD-10-CM

## 2022-05-18 ENCOUNTER — Encounter: Payer: Self-pay | Admitting: Family Medicine

## 2022-05-18 ENCOUNTER — Ambulatory Visit (INDEPENDENT_AMBULATORY_CARE_PROVIDER_SITE_OTHER): Payer: Medicare Other | Admitting: Family Medicine

## 2022-05-18 VITALS — BP 134/82 | HR 80 | Temp 97.4°F | Ht 66.5 in | Wt 350.8 lb

## 2022-05-18 DIAGNOSIS — K219 Gastro-esophageal reflux disease without esophagitis: Secondary | ICD-10-CM | POA: Diagnosis not present

## 2022-05-18 DIAGNOSIS — I10 Essential (primary) hypertension: Secondary | ICD-10-CM | POA: Diagnosis not present

## 2022-05-18 DIAGNOSIS — R053 Chronic cough: Secondary | ICD-10-CM | POA: Diagnosis not present

## 2022-05-18 MED ORDER — OMEPRAZOLE 20 MG PO CPDR: 20.0000 mg | DELAYED_RELEASE_CAPSULE | Freq: Every day | ORAL | 3 refills | Status: DC

## 2022-05-18 MED ORDER — AMLODIPINE BESYLATE 5 MG PO TABS: 5.0000 mg | ORAL_TABLET | Freq: Every day | ORAL | 3 refills | Status: DC

## 2022-05-18 NOTE — Assessment & Plan Note (Signed)
BP remains mildly high. I will plan to add a low-dose of amlodipine to see if we can get this down under 130/80. We did discuss watching for possible lower leg edema. I will reassess her in 1 month.

## 2022-05-18 NOTE — Progress Notes (Signed)
[facility] [address] [place] [address] Dept: [phone] Dept Fax: [phone]  Chronic Care Office Visit  Subjective:    Patient ID: Katherine Parker, female    DOB: Sep 02, 1965, 57 y.o..   MRN: [id number]  Chief Complaint  Patient presents with   Medical Management of Chronic Issues    4 week f/u.  C/o still having a cough    History of Present Illness:  Patient is in today for reassessment of chronic medical issues.  Katherine Parker has a history of hypertension. She is managed on metoprolol 25 mg daily for this and for a history of SVT. She had been noted to have some hypertrophic cardiomyopathy with [facility] on an echocardiogram in 2022.   Katherine Parker has a history of mild intermittent asthma. She uses an albuterol inhaler for this. She continues to have increased cough now going on for several months. She admits to mild nasal congestion. She has not been using her Zyrtec. She does use albuterol inhaler/nebs as needed. She also does admit to regular heartburn issues. She is not taking any specific medication for this.   Past Medical History: Patient Active Problem List   Diagnosis Date Noted   Chronic constipation 12/08/2021   History of bulimia nervosa 12/08/2021   Vitiligo 07/27/2021   Obstructive sleep apnea 03/24/2021   Stutter 03/24/2021   Paroxysmal SVT (supraventricular tachycardia) 11/18/2020   Normocytic anemia 10/20/2020   Spondylolisthesis at L5-S1 level 10/20/2020   Prediabetes 10/20/2020   Hypertrophic cardiomyopathy ([facility]) 10/13/2020   Morbid obesity with BMI of 50.0-59.9, adult ([facility]) 10/13/2020   Essential hypertension 10/13/2020   Chronic right-sided low back pain with right-sided sciatica 10/13/2020   Borderline hyperlipidemia 03/09/2020   Insomnia 03/09/2020   Mild intermittent asthma 03/09/2020   Moderate recurrent major depression ([facility]) 03/09/2020   Past Surgical History:  Procedure Laterality Date    CESAREAN SECTION     GUM SURGERY     TUBAL LIGATION     Family History  Problem Relation Age of Onset   Heart disease Mother    COPD Mother    Congestive Heart Failure Mother    Hypertension Mother    Diabetes Mother    Dementia Father    Diabetes Maternal Grandmother    Colon cancer Maternal Grandmother    Dementia Paternal Grandmother    Dementia Paternal Grandfather    Cancer Half-Sister        Breast, brain metastasis   Cancer Half-Sister        Uterine   Outpatient Medications Prior to Visit  Medication Sig Dispense Refill   acetaminophen-codeine (TYLENOL #3) 300-30 MG tablet Take 1 tablet by mouth every 4 (four) hours as needed for moderate pain. 10 tablet 0   albuterol (PROVENTIL) (2.5 MG/3ML) 0.083% nebulizer solution Take 3 mLs (2.5 mg total) by nebulization every 6 (six) hours as needed for wheezing or shortness of breath. 75 mL 12   albuterol (VENTOLIN HFA) 108 (90 Base) MCG/ACT inhaler INHALE 1-2 PUFFS BY MOUTH EVERY 6 HOURS AS NEEDED FOR WHEEZE OR SHORTNESS OF BREATH 18 each 1   busPIRone (BUSPAR) 5 MG tablet Take 1 tablet (5 mg total) by mouth 2 (two) times daily. 180 tablet 2   cetirizine (ZYRTEC) 10 MG tablet Take 1 tablet (10 mg total) by mouth daily. 30 tablet 11   ibuprofen (ADVIL) 200 MG tablet Take 400 mg by mouth every 6 (six) hours as needed for mild pain.  Melatonin 10 MG TABS Take 10 mg by mouth at bedtime as needed (sleep).     metoprolol succinate (TOPROL XL) 50 MG 24 hr tablet Take 0.5 tablets (25 mg total) by mouth daily. 90 tablet 3   predniSONE (DELTASONE) 20 MG tablet Take 1 tablet (20 mg total) by mouth daily with breakfast. 7 tablet 0   prochlorperazine (COMPAZINE) 5 MG tablet Take 1 tablet (5 mg total) by mouth every 6 (six) hours as needed for nausea or vomiting. 30 tablet 0   terbinafine (LAMISIL) 250 MG tablet Take 1 tablet (250 mg total) by mouth daily. 90 tablet 0   venlafaxine XR (EFFEXOR XR) 75 MG 24 hr capsule Take 1 capsule (75 mg  total) by mouth daily with breakfast. Take togther with 150 mg dose (total dose= 225 mg) 90 capsule 3   venlafaxine XR (EFFEXOR-XR) 150 MG 24 hr capsule Take 1 capsule (150 mg total) by mouth daily with breakfast. Take together with 75 mg dose (total dose= 225 mg) 90 capsule 3   No facility-administered medications prior to visit.   Allergies  Allergen Reactions   Penicillin G     Other reaction(s): Unknown   Penicillins     Did it involve swelling of the face/tongue/throat, SOB, or low BP? Unknown Did it involve sudden or severe rash/hives, skin peeling, or any reaction on the inside of your mouth or nose? Unknown Did you need to seek medical attention at a hospital or doctor's office? Unknown When did it last happen?  Childhood    If all above answers are "NO", may proceed with cephalosporin use.     Objective:   Today's Vitals   05/18/22 0843  BP: 134/82  Pulse: 80  Temp: (!) 97.4 F (36.3 C)  TempSrc: Temporal  SpO2: 99%  Weight: (!) 350 lb 12.8 oz (159.1 kg)  Height: 5' 6.5" (1.689 m)   Body mass index is 55.77 kg/m.   General: Well developed, well nourished. No acute distress. Lungs: Clear to auscultation bilaterally. No wheezing, rales or rhonchi. CV: RRR without murmurs or rubs. Pulses 2+ bilaterally. Psych: Alert and oriented. Normal mood and affect.  Health Maintenance Due  Topic Date Due   HIV Screening  Never done   Hepatitis C Screening  Never done   MAMMOGRAM  Never done     Assessment & Plan:   Problem List Items Addressed This Visit       Cardiovascular and Mediastinum   Essential hypertension - Primary    BP remains mildly high. I will plan to add a low-dose of amlodipine to see if we can get this down under 130/80. We did discuss watching for possible lower leg edema. I will reassess her in 1 month.      Relevant Medications   amLODipine (NORVASC) 5 MG tablet     Digestive   Gastroesophageal reflux disease without esophagitis    By  history, Katherine Parker is having considerable heartburn. I will give her a trial of a PPI to see if this will reduce heartburn and potentially reduce or stop her chronic cough.      Relevant Medications   omeprazole (PRILOSEC) 20 MG capsule   Other Visit Diagnoses     Chronic cough       May be multifactorial. Recommend she take her allergy pill. Will add a PPI for chronic heartburn.       Return in about 4 weeks (around 06/15/2022) for Reassessment, labs.   Loyola Mast,  MD

## 2022-05-18 NOTE — Assessment & Plan Note (Signed)
By history, Katherine Parker is having considerable heartburn. I will give her a trial of a PPI to see if this will reduce heartburn and potentially reduce or stop her chronic cough.

## 2022-06-14 ENCOUNTER — Encounter: Payer: Medicare Other | Admitting: Psychology

## 2022-06-15 ENCOUNTER — Encounter: Payer: Self-pay | Admitting: Family Medicine

## 2022-06-15 ENCOUNTER — Ambulatory Visit (INDEPENDENT_AMBULATORY_CARE_PROVIDER_SITE_OTHER): Payer: Medicare Other | Admitting: Family Medicine

## 2022-06-15 VITALS — BP 124/84 | HR 85 | Temp 98.2°F | Ht 66.5 in | Wt 350.0 lb

## 2022-06-15 DIAGNOSIS — Z6841 Body Mass Index (BMI) 40.0 and over, adult: Secondary | ICD-10-CM | POA: Diagnosis not present

## 2022-06-15 DIAGNOSIS — I10 Essential (primary) hypertension: Secondary | ICD-10-CM | POA: Diagnosis not present

## 2022-06-15 DIAGNOSIS — Z1231 Encounter for screening mammogram for malignant neoplasm of breast: Secondary | ICD-10-CM

## 2022-06-15 NOTE — Assessment & Plan Note (Signed)
BP is improved. Continue metoprolol succinate 50 mg daily and amlodipine 5 mg daily.

## 2022-06-15 NOTE — Assessment & Plan Note (Signed)
I will try referring Ms. Gilles to the [facility] clinic.

## 2022-06-15 NOTE — Progress Notes (Signed)
[facility] [address] [place] [address] Dept: [phone] Dept Fax: [phone]  Chronic Care Office Visit  Subjective:    Patient ID: Katherine Parker, female    DOB: 29-Sep-1965, 57 y.o..   MRN: [id number]  Chief Complaint  Patient presents with   Medical Management of Chronic Issues    4 week f/u.  No concerns.    History of Present Illness:  Patient is in today for reassessment of chronic medical issues.  Katherine Parker has a history of hypertension. She has been managed on metoprolol 25 mg daily for this and for a history of SVT. She had been noted to have some hypertrophic cardiomyopathy with LVH on an echocardiogram in 2022. At her last visit, we added amlodipine 5 mg daily, as she had continued to show elevated BPs. She is tolerating this well and has not noted any increased swelling.  Katherine Parker has a history of obesity. She has worries about the impact of her weight on her health. She notes one of her issues is binge eating.   Past Medical History: Patient Active Problem List   Diagnosis Date Noted   Gastroesophageal reflux disease without esophagitis 05/18/2022   Chronic constipation 12/08/2021   History of bulimia nervosa 12/08/2021   Vitiligo 07/27/2021   Obstructive sleep apnea 03/24/2021   Stutter 03/24/2021   Paroxysmal SVT (supraventricular tachycardia) 11/18/2020   Normocytic anemia 10/20/2020   Spondylolisthesis at L5-S1 level 10/20/2020   Prediabetes 10/20/2020   Hypertrophic cardiomyopathy (HCC) 10/13/2020   Morbid obesity with BMI of 50.0-59.9, adult (HCC) 10/13/2020   Essential hypertension 10/13/2020   Chronic right-sided low back pain with right-sided sciatica 10/13/2020   Borderline hyperlipidemia 03/09/2020   Insomnia 03/09/2020   Mild intermittent asthma 03/09/2020   Moderate recurrent major depression (HCC) 03/09/2020   Past Surgical History:  Procedure Laterality Date   CESAREAN SECTION      GUM SURGERY     TUBAL LIGATION     Family History  Problem Relation Age of Onset   Heart disease Mother    COPD Mother    Congestive Heart Failure Mother    Hypertension Mother    Diabetes Mother    Dementia Father    Diabetes Maternal Grandmother    Colon cancer Maternal Grandmother    Dementia Paternal Grandmother    Dementia Paternal Grandfather    Cancer Half-Sister        Breast, brain metastasis   Cancer Half-Sister        Uterine   Outpatient Medications Prior to Visit  Medication Sig Dispense Refill   acetaminophen-codeine (TYLENOL #3) 300-30 MG tablet Take 1 tablet by mouth every 4 (four) hours as needed for moderate pain. 10 tablet 0   albuterol (PROVENTIL) (2.5 MG/3ML) 0.083% nebulizer solution Take 3 mLs (2.5 mg total) by nebulization every 6 (six) hours as needed for wheezing or shortness of breath. 75 mL 12   albuterol (VENTOLIN HFA) 108 (90 Base) MCG/ACT inhaler INHALE 1-2 PUFFS BY MOUTH EVERY 6 HOURS AS NEEDED FOR WHEEZE OR SHORTNESS OF BREATH 18 each 1   amLODipine (NORVASC) 5 MG tablet Take 1 tablet (5 mg total) by mouth daily. 90 tablet 3   busPIRone (BUSPAR) 5 MG tablet Take 1 tablet (5 mg total) by mouth 2 (two) times daily. 180 tablet 2   cetirizine (ZYRTEC) 10 MG tablet Take 1 tablet (10 mg total) by mouth daily. 30 tablet 11   ibuprofen (ADVIL) 200 MG tablet  Take 400 mg by mouth every 6 (six) hours as needed for mild pain.     Melatonin 10 MG TABS Take 10 mg by mouth at bedtime as needed (sleep).     metoprolol succinate (TOPROL XL) 50 MG 24 hr tablet Take 0.5 tablets (25 mg total) by mouth daily. 90 tablet 3   omeprazole (PRILOSEC) 20 MG capsule Take 1 capsule (20 mg total) by mouth daily. 30 capsule 3   predniSONE (DELTASONE) 20 MG tablet Take 1 tablet (20 mg total) by mouth daily with breakfast. 7 tablet 0   prochlorperazine (COMPAZINE) 5 MG tablet Take 1 tablet (5 mg total) by mouth every 6 (six) hours as needed for nausea or vomiting. 30 tablet 0    terbinafine (LAMISIL) 250 MG tablet Take 1 tablet (250 mg total) by mouth daily. 90 tablet 0   venlafaxine XR (EFFEXOR XR) 75 MG 24 hr capsule Take 1 capsule (75 mg total) by mouth daily with breakfast. Take togther with 150 mg dose (total dose= 225 mg) 90 capsule 3   venlafaxine XR (EFFEXOR-XR) 150 MG 24 hr capsule Take 1 capsule (150 mg total) by mouth daily with breakfast. Take together with 75 mg dose (total dose= 225 mg) 90 capsule 3   No facility-administered medications prior to visit.   Allergies  Allergen Reactions   Penicillin G     Other reaction(s): Unknown   Penicillins     Did it involve swelling of the face/tongue/throat, SOB, or low BP? Unknown Did it involve sudden or severe rash/hives, skin peeling, or any reaction on the inside of your mouth or nose? Unknown Did you need to seek medical attention at a hospital or doctor's office? Unknown When did it last happen?  Childhood    If all above answers are "NO", may proceed with cephalosporin use.     Objective:   Today's Vitals   06/15/22 0838  BP: 124/84  Pulse: 85  Temp: 98.2 F (36.8 C)  TempSrc: Temporal  SpO2: 98%  Weight: (!) 350 lb (158.8 kg)  Height: 5' 6.5" (1.689 m)   Body mass index is 55.65 kg/m.   General: Well developed, well nourished. No acute distress. Extremities: No edema. Psych: Alert and oriented. Normal mood and affect.  Health Maintenance Due  Topic Date Due   HIV Screening  Never done   Hepatitis C Screening  Never done   MAMMOGRAM  Never done     Assessment & Plan:   Problem List Items Addressed This Visit       Cardiovascular and Mediastinum   Essential hypertension - Primary    BP is improved. Continue metoprolol succinate 50 mg daily and amlodipine 5 mg daily.        Other   Morbid obesity with BMI of 50.0-59.9, adult [facility])    I will try referring Katherine Parker to the [facility] clinic.      Relevant Orders   Amb Ref to Medical Weight Management   Other  Visit Diagnoses     Encounter for screening mammogram for malignant neoplasm of breast       Relevant Orders   MM DIGITAL SCREENING BILATERAL       Return in about 3 months (around 09/15/2022) for Reassessment.   Loyola Mast, MD

## 2022-06-29 ENCOUNTER — Encounter: Payer: Self-pay | Admitting: Gastroenterology

## 2022-06-29 ENCOUNTER — Ambulatory Visit: Payer: Medicare Other | Admitting: Gastroenterology

## 2022-06-29 VITALS — BP 120/72 | HR 50 | Ht 66.5 in | Wt 352.0 lb

## 2022-06-29 DIAGNOSIS — Z8379 Family history of other diseases of the digestive system: Secondary | ICD-10-CM | POA: Diagnosis not present

## 2022-06-29 DIAGNOSIS — K5909 Other constipation: Secondary | ICD-10-CM | POA: Diagnosis not present

## 2022-06-29 DIAGNOSIS — K219 Gastro-esophageal reflux disease without esophagitis: Secondary | ICD-10-CM | POA: Diagnosis not present

## 2022-06-29 DIAGNOSIS — Z1211 Encounter for screening for malignant neoplasm of colon: Secondary | ICD-10-CM

## 2022-06-29 DIAGNOSIS — R12 Heartburn: Secondary | ICD-10-CM

## 2022-06-29 DIAGNOSIS — Z800 Family history of malignant neoplasm of digestive organs: Secondary | ICD-10-CM

## 2022-06-29 DIAGNOSIS — Z6841 Body Mass Index (BMI) 40.0 and over, adult: Secondary | ICD-10-CM

## 2022-06-29 MED ORDER — CLENPIQ 10-3.5-12 MG-GM -GM/175ML PO SOLN: 1.0000 | Freq: Once | ORAL | 0 refills | Status: AC

## 2022-06-29 NOTE — Patient Instructions (Addendum)
You have been scheduled for an endoscopy and colonoscopy at [facility]. Please follow the written instructions given to you at your visit today. Please pick up your prep supplies at the pharmacy within the next 1-3 days. If you use inhalers (even only as needed), please bring them with you on the day of your procedure.   _______________________________________________________  If your blood pressure at your visit was 140/90 or greater, please contact your primary care physician to follow up on this.  _______________________________________________________  If you are age 79 or older, your body mass index should be between 23-30. Your Body mass index is 55.96 kg/m. If this is out of the aforementioned range listed, please consider follow up with your Primary Care Provider.  If you are age 49 or younger, your body mass index should be between 19-25. Your Body mass index is 55.96 kg/m. If this is out of the aformentioned range listed, please consider follow up with your Primary Care Provider.   __________________________________________________________  The [facility] providers would like to encourage you to use [facility] to communicate with providers for non-urgent requests or questions.  Due to long hold times on the telephone, sending your provider a message by [facility] may be a faster and more efficient way to get a response.  Please allow 48 business hours for a response.  Please remember that this is for non-urgent requests.   Due to recent changes in healthcare laws, you may see the results of your imaging and laboratory studies on MyChart before your provider has had a chance to review them.  We understand that in some cases there may be results that are confusing or concerning to you. Not all laboratory results come back in the same time frame and the provider may be waiting for multiple results in order to interpret others.  Please give [place] 48 hours in order for your provider to  thoroughly review all the results before contacting the office for clarification of your results.   Thank you for choosing me and [facility].  Vito Cirigliano, D.O.

## 2022-06-29 NOTE — Progress Notes (Signed)
Chief Complaint: Constipation, heartburn   Referring Provider:     Loyola Mast, MD   HPI:     Katherine Parker is a 57 y.o. female with a history of HTN, SVT, hypertrophic cardiomyopathy, obesity, OSA, asthma, depression, h/o bulimia, GERD, referred to the Gastroenterology Clinic for evaluation of chronic constipation and heartburn.  Has had chronic constipation for many years.  No history of hematochezia, melena. Can go a few days without BM with episodic straining to have BM. Previously used laxatives regularly in her teens and early 70s, but that was actually more of a anorexia weight control issue rather than constipation at that time.  In the last several years has used laxatives at times for constipation.  Separately, history of heartburn, belching for many years as well.  No dysphagia. Sxs can occur independent of eating. Was recently started on Prilosec 20 mg daily last month by her PCM for GERD, but she hasn't noticed much improvement, and stopped taking.   Separately, feel like a "weight in my stomach". Can occur after eating and with certain movements like side bending, twisting, etc.  Daughter has Crohns and sister with Ulcerative colitis. Grandmother and [place] grandmother with colon cancer.   No previous EGD/Colonoscopy.    Reviewed most recent labs from 12/2021 with normal CBC, CMP, lipase.  No recent abdominal imaging for review.  Past Medical History:  Diagnosis Date   Anxiety    CHF (congestive heart failure) (HCC)    Hypertension    Irritable bowel syndrome    SVT (supraventricular tachycardia)      Past Surgical History:  Procedure Laterality Date   CESAREAN SECTION     GUM SURGERY     TUBAL LIGATION     Family History  Problem Relation Age of Onset   Heart disease Mother    COPD Mother    Congestive Heart Failure Mother    Hypertension Mother    Diabetes Mother    Dementia Father    Breast cancer Sister    Colitis Sister     Diabetes Maternal Grandmother    Colon cancer Maternal Grandmother    Dementia Paternal Grandmother    Dementia Paternal Grandfather    Crohn's disease Daughter    Cancer Half-Sister        Breast, brain metastasis   Cancer Half-Sister        Uterine   Colon cancer Other        mat great gm   Esophageal cancer Maternal Aunt    Social History   Tobacco Use   Smoking status: Never   Smokeless tobacco: Never  Vaping Use   Vaping Use: Never used  Substance Use Topics   Alcohol use: Never   Drug use: Never   Current Outpatient Medications  Medication Sig Dispense Refill   acetaminophen-codeine (TYLENOL #3) 300-30 MG tablet Take 1 tablet by mouth every 4 (four) hours as needed for moderate pain. 10 tablet 0   albuterol (PROVENTIL) (2.5 MG/3ML) 0.083% nebulizer solution Take 3 mLs (2.5 mg total) by nebulization every 6 (six) hours as needed for wheezing or shortness of breath. 75 mL 12   albuterol (VENTOLIN HFA) 108 (90 Base) MCG/ACT inhaler INHALE 1-2 PUFFS BY MOUTH EVERY 6 HOURS AS NEEDED FOR WHEEZE OR SHORTNESS OF BREATH 18 each 1   amLODipine (NORVASC) 5 MG tablet Take 1 tablet (5 mg total) by mouth daily. 90 tablet 3  busPIRone (BUSPAR) 5 MG tablet Take 1 tablet (5 mg total) by mouth 2 (two) times daily. 180 tablet 2   cetirizine (ZYRTEC) 10 MG tablet Take 1 tablet (10 mg total) by mouth daily. 30 tablet 11   ibuprofen (ADVIL) 200 MG tablet Take 400 mg by mouth every 6 (six) hours as needed for mild pain.     Melatonin 10 MG TABS Take 10 mg by mouth at bedtime as needed (sleep).     metoprolol succinate (TOPROL XL) 50 MG 24 hr tablet Take 0.5 tablets (25 mg total) by mouth daily. 90 tablet 3   omeprazole (PRILOSEC) 20 MG capsule Take 1 capsule (20 mg total) by mouth daily. 30 capsule 3   predniSONE (DELTASONE) 20 MG tablet Take 1 tablet (20 mg total) by mouth daily with breakfast. 7 tablet 0   prochlorperazine (COMPAZINE) 5 MG tablet Take 1 tablet (5 mg total) by mouth every 6  (six) hours as needed for nausea or vomiting. 30 tablet 0   terbinafine (LAMISIL) 250 MG tablet Take 1 tablet (250 mg total) by mouth daily. 90 tablet 0   venlafaxine XR (EFFEXOR XR) 75 MG 24 hr capsule Take 1 capsule (75 mg total) by mouth daily with breakfast. Take togther with 150 mg dose (total dose= 225 mg) 90 capsule 3   venlafaxine XR (EFFEXOR-XR) 150 MG 24 hr capsule Take 1 capsule (150 mg total) by mouth daily with breakfast. Take together with 75 mg dose (total dose= 225 mg) 90 capsule 3   No current facility-administered medications for this visit.   Allergies  Allergen Reactions   Penicillin G     Other reaction(s): Unknown   Penicillins     Did it involve swelling of the face/tongue/throat, SOB, or low BP? Unknown Did it involve sudden or severe rash/hives, skin peeling, or any reaction on the inside of your mouth or nose? Unknown Did you need to seek medical attention at a hospital or doctor's office? Unknown When did it last happen?  Childhood    If all above answers are "NO", may proceed with cephalosporin use.       Review of Systems: All systems reviewed and negative except where noted in HPI.     Physical Exam:    Wt Readings from Last 3 Encounters:  06/29/22 (!) 352 lb (159.7 kg)  06/15/22 (!) 350 lb (158.8 kg)  05/18/22 (!) 350 lb 12.8 oz (159.1 kg)    BP 120/72   Pulse (!) 50   Ht 5' 6.5" (1.689 m)   Wt (!) 352 lb (159.7 kg)   BMI 55.96 kg/m  Constitutional:  Pleasant, in no acute distress. Psychiatric: Normal mood and affect. Behavior is normal. Cardiovascular: Normal rate, regular rhythm. No edema Pulmonary/chest: Effort normal and breath sounds normal. No wheezing, rales or rhonchi. Abdominal: Soft, nondistended, nontender. Bowel sounds active throughout. There are no masses palpable. No hepatomegaly. Neurological: Alert and oriented to person place and time. Skin: Skin is warm and dry. No rashes noted.   ASSESSMENT AND PLAN;   1)  Heartburn 2) Belching - EGD to evaluate for erosive esophagitis, LES laxity, hiatal hernia - Not much improvement with trial of omeprazole.  Will hold off on introducing new medications for the time being and per conversation with patient today - Antireflux lifestyle/dietary modifications in the interim  3) Chronic constipation Discussed broad DDx for chronic constipation at length with patient today - Colonoscopy to evaluate for mucosal extraluminal pathology - Will need extended 2-day  bowel preparation  4) Colon cancer screening 5) Family history of colon cancer 6) Family history of Crohn's Disease and Ulcerative Colitis - Colonoscopy as above  7) Morbid obesity (BMI 55.9) 8) OSA 9) Hypertrophic cardiomyopathy - Procedures to be scheduled at [facility] due to elevated periprocedural risks from underlying comorbidities   The indications, risks, and benefits of EGD and colonoscopy were explained to the patient in detail. Risks include but are not limited to bleeding, perforation, adverse reaction to medications, and cardiopulmonary compromise. Sequelae include but are not limited to the possibility of surgery, hospitalization, and mortality. The patient verbalized understanding and wished to proceed. All questions answered, referred to scheduler and bowel prep ordered. Further recommendations pending results of the exam.     Shellia Cleverly, DO, FACG  06/29/2022, 9:41 AM   Veto Kemps, Bertram Millard, MD

## 2022-07-14 ENCOUNTER — Encounter: Payer: Self-pay | Admitting: Gastroenterology

## 2022-07-18 ENCOUNTER — Ambulatory Visit
Admission: RE | Admit: 2022-07-18 | Discharge: 2022-07-18 | Disposition: A | Discharge status: Home / Self Care | Payer: Medicare Other | Source: Ambulatory Visit

## 2022-07-18 DIAGNOSIS — Z1231 Encounter for screening mammogram for malignant neoplasm of breast: Secondary | ICD-10-CM | POA: Diagnosis not present

## 2022-07-21 ENCOUNTER — Other Ambulatory Visit: Payer: Self-pay | Admitting: Family Medicine

## 2022-07-21 DIAGNOSIS — R928 Other abnormal and inconclusive findings on diagnostic imaging of breast: Secondary | ICD-10-CM

## 2022-07-27 ENCOUNTER — Other Ambulatory Visit: Payer: Self-pay | Admitting: Family Medicine

## 2022-07-27 ENCOUNTER — Ambulatory Visit
Admission: RE | Admit: 2022-07-27 | Discharge: 2022-07-27 | Disposition: A | Discharge status: Home / Self Care | Payer: Medicare Other | Source: Ambulatory Visit | Attending: Family Medicine | Admitting: Family Medicine

## 2022-07-27 DIAGNOSIS — N6310 Unspecified lump in the right breast, unspecified quadrant: Secondary | ICD-10-CM

## 2022-07-27 DIAGNOSIS — N6314 Unspecified lump in the right breast, lower inner quadrant: Secondary | ICD-10-CM | POA: Diagnosis not present

## 2022-07-27 DIAGNOSIS — R928 Other abnormal and inconclusive findings on diagnostic imaging of breast: Secondary | ICD-10-CM

## 2022-07-28 ENCOUNTER — Encounter (HOSPITAL_COMMUNITY): Admission: RE | Payer: Self-pay | Source: Home / Self Care

## 2022-07-28 ENCOUNTER — Encounter (HOSPITAL_COMMUNITY): Payer: Self-pay

## 2022-07-28 ENCOUNTER — Ambulatory Visit (HOSPITAL_COMMUNITY): Admission: RE | Admit: 2022-07-28 | Payer: Medicare Other | Source: Home / Self Care | Admitting: Gastroenterology

## 2022-07-28 SURGERY — COLONOSCOPY WITH PROPOFOL
Anesthesia: Monitor Anesthesia Care

## 2022-07-29 ENCOUNTER — Telehealth: Payer: Self-pay

## 2022-07-29 NOTE — Telephone Encounter (Signed)
Called patient LVM regarding to reschedule colonoscopy at the hospital. Sent patient MyChart message as well.

## 2022-07-29 NOTE — Telephone Encounter (Signed)
-----   Message from Nurse Kyra Searles sent at 07/28/2022 11:16 AM EDT ----- Regarding: pt canceled for 7/25 Hey, Katherine Parker is canceling her egd/colon today. She stated she never received the prep and also she thought it was scheduled for next month. I let her know the office should call to reschedule her.  Roselie Awkward, RN

## 2022-07-30 ENCOUNTER — Encounter (HOSPITAL_COMMUNITY): Payer: Self-pay

## 2022-07-30 ENCOUNTER — Other Ambulatory Visit (HOSPITAL_COMMUNITY): Payer: Self-pay | Admitting: Emergency Medicine

## 2022-07-30 ENCOUNTER — Emergency Department (HOSPITAL_COMMUNITY): Payer: Medicare Other

## 2022-07-30 ENCOUNTER — Ambulatory Visit (HOSPITAL_COMMUNITY)
Admission: RE | Admit: 2022-07-30 | Discharge: 2022-07-30 | Disposition: A | Discharge status: Home / Self Care | Payer: Medicare Other | Source: Ambulatory Visit | Attending: Emergency Medicine | Admitting: Emergency Medicine

## 2022-07-30 ENCOUNTER — Emergency Department (HOSPITAL_COMMUNITY)
Admission: EM | Admit: 2022-07-30 | Discharge: 2022-07-30 | Disposition: A | Discharge status: Home / Self Care | Payer: Medicare Other | Attending: Emergency Medicine | Admitting: Emergency Medicine

## 2022-07-30 ENCOUNTER — Other Ambulatory Visit: Payer: Self-pay

## 2022-07-30 DIAGNOSIS — I5030 Unspecified diastolic (congestive) heart failure: Secondary | ICD-10-CM | POA: Diagnosis not present

## 2022-07-30 DIAGNOSIS — R0602 Shortness of breath: Secondary | ICD-10-CM | POA: Insufficient documentation

## 2022-07-30 DIAGNOSIS — I110 Hypertensive heart disease with heart failure: Secondary | ICD-10-CM | POA: Diagnosis not present

## 2022-07-30 DIAGNOSIS — I10 Essential (primary) hypertension: Secondary | ICD-10-CM

## 2022-07-30 DIAGNOSIS — Z1152 Encounter for screening for COVID-19: Secondary | ICD-10-CM | POA: Insufficient documentation

## 2022-07-30 DIAGNOSIS — Z79899 Other long term (current) drug therapy: Secondary | ICD-10-CM | POA: Insufficient documentation

## 2022-07-30 DIAGNOSIS — M7989 Other specified soft tissue disorders: Secondary | ICD-10-CM | POA: Diagnosis not present

## 2022-07-30 DIAGNOSIS — I5032 Chronic diastolic (congestive) heart failure: Secondary | ICD-10-CM | POA: Diagnosis not present

## 2022-07-30 LAB — I-STAT CHEM 8, ED
BUN: 16 mg/dL (ref 6–20)
Calcium, Ion: 1.24 mmol/L (ref 1.15–1.40)
Chloride: 105 mmol/L (ref 98–111)
Creatinine, Ser: 0.6 mg/dL (ref 0.44–1.00)
Glucose, Bld: 102 mg/dL — ABNORMAL HIGH (ref 70–99)
HCT: 37 % (ref 36.0–46.0)
Hemoglobin: 12.6 g/dL (ref 12.0–15.0)
Potassium: 3.8 mmol/L (ref 3.5–5.1)
Sodium: 141 mmol/L (ref 135–145)
TCO2: 25 mmol/L (ref 22–32)

## 2022-07-30 LAB — BLOOD GAS, VENOUS
Acid-Base Excess: 1.6 mmol/L (ref 0.0–2.0)
Bicarbonate: 27.2 mmol/L (ref 20.0–28.0)
O2 Saturation: 87.3 %
Patient temperature: 37
pCO2, Ven: 46 mmHg (ref 44–60)
pH, Ven: 7.38 (ref 7.25–7.43)
pO2, Ven: 52 mmHg — ABNORMAL HIGH (ref 32–45)

## 2022-07-30 LAB — BRAIN NATRIURETIC PEPTIDE: B Natriuretic Peptide: 42.1 pg/mL (ref 0.0–100.0)

## 2022-07-30 LAB — CBC WITH DIFFERENTIAL/PLATELET
Abs Immature Granulocytes: 0.07 10*3/uL (ref 0.00–0.07)
Basophils Absolute: 0 10*3/uL (ref 0.0–0.1)
Basophils Relative: 0 %
Eosinophils Absolute: 0.1 10*3/uL (ref 0.0–0.5)
Eosinophils Relative: 2 %
HCT: 37.5 % (ref 36.0–46.0)
Hemoglobin: 11.5 g/dL — ABNORMAL LOW (ref 12.0–15.0)
Immature Granulocytes: 1 %
Lymphocytes Relative: 28 %
Lymphs Abs: 1.7 10*3/uL (ref 0.7–4.0)
MCH: 26.9 pg (ref 26.0–34.0)
MCHC: 30.7 g/dL (ref 30.0–36.0)
MCV: 87.8 fL (ref 80.0–100.0)
Monocytes Absolute: 0.4 10*3/uL (ref 0.1–1.0)
Monocytes Relative: 7 %
Neutro Abs: 3.7 10*3/uL (ref 1.7–7.7)
Neutrophils Relative %: 62 %
Platelets: 256 10*3/uL (ref 150–400)
RBC: 4.27 MIL/uL (ref 3.87–5.11)
RDW: 13.5 % (ref 11.5–15.5)
WBC: 6 10*3/uL (ref 4.0–10.5)
nRBC: 0 % (ref 0.0–0.2)

## 2022-07-30 LAB — SARS CORONAVIRUS 2 BY RT PCR: SARS Coronavirus 2 by RT PCR: NEGATIVE

## 2022-07-30 LAB — COMPREHENSIVE METABOLIC PANEL
ALT: 11 U/L (ref 0–44)
AST: 19 U/L (ref 15–41)
Albumin: 3.7 g/dL (ref 3.5–5.0)
Alkaline Phosphatase: 87 U/L (ref 38–126)
Anion gap: 7 (ref 5–15)
BUN: 17 mg/dL (ref 6–20)
CO2: 25 mmol/L (ref 22–32)
Calcium: 9.2 mg/dL (ref 8.9–10.3)
Chloride: 107 mmol/L (ref 98–111)
Creatinine, Ser: 0.56 mg/dL (ref 0.44–1.00)
GFR, Estimated: >60 mL/min (ref >60)
Glucose, Bld: 107 mg/dL — ABNORMAL HIGH (ref 70–99)
Potassium: 3.8 mmol/L (ref 3.5–5.1)
Sodium: 139 mmol/L (ref 135–145)
Total Bilirubin: 0.3 mg/dL (ref 0.3–1.2)
Total Protein: 7.6 g/dL (ref 6.5–8.1)

## 2022-07-30 LAB — TROPONIN I (HIGH SENSITIVITY)
Troponin I (High Sensitivity): 5 ng/L (ref <18)
Troponin I (High Sensitivity): 5 ng/L (ref <18)

## 2022-07-30 MED ORDER — AMLODIPINE BESYLATE 5 MG PO TABS
5.0000 mg | ORAL_TABLET | Freq: Every day | ORAL | 1 refills | Status: DC
Start: 2022-07-30 — End: 2023-01-12

## 2022-07-30 MED ORDER — HYDRALAZINE HCL 20 MG/ML IJ SOLN
10.0000 mg | Freq: Once | INTRAMUSCULAR | Status: AC
  Administered 2022-07-30: 10 mg via INTRAVENOUS
  Filled 2022-07-30: qty 1

## 2022-07-30 MED ORDER — ALBUTEROL SULFATE HFA 108 (90 BASE) MCG/ACT IN AERS
2.0000 | INHALATION_SPRAY | Freq: Once | RESPIRATORY_TRACT | Status: AC
  Administered 2022-07-30: 2 via RESPIRATORY_TRACT
  Filled 2022-07-30: qty 6.7

## 2022-07-30 MED ORDER — IOHEXOL 350 MG/ML SOLN
75.0000 mL | Freq: Once | INTRAVENOUS | Status: AC | PRN
  Administered 2022-07-30: 75 mL via INTRAVENOUS

## 2022-07-30 MED ORDER — IPRATROPIUM-ALBUTEROL 0.5-2.5 (3) MG/3ML IN SOLN
3.0000 mL | Freq: Once | RESPIRATORY_TRACT | Status: AC
  Administered 2022-07-30: 3 mL via RESPIRATORY_TRACT
  Filled 2022-07-30: qty 3

## 2022-07-30 MED ORDER — AMLODIPINE BESYLATE 5 MG PO TABS
10.0000 mg | ORAL_TABLET | Freq: Once | ORAL | Status: AC
  Administered 2022-07-30: 10 mg via ORAL
  Filled 2022-07-30: qty 2

## 2022-07-30 MED ORDER — IOHEXOL 350 MG/ML SOLN: 100.0000 mL | Freq: Once | INTRAVENOUS | Status: DC | PRN

## 2022-07-30 MED ORDER — SODIUM CHLORIDE (PF) 0.9 % IJ SOLN
INTRAMUSCULAR | Status: AC
  Filled 2022-07-30: qty 50

## 2022-07-30 NOTE — Discharge Instructions (Addendum)
Your blood pressure was elevated and that is likely causing your symptoms  I have prescribed Norvasc 5 mg daily.  You need to see your doctor in a week  You also should take albuterol 2 puffs every 4 hours as needed for shortness of breath  Return to ER if you have worse shortness of breath or chest pain or leg swelling

## 2022-07-30 NOTE — ED Provider Notes (Signed)
[place] EMERGENCY DEPARTMENT AT [facility] Provider Note   CSN: [id number] Arrival date & time: 07/30/22  1531     History  Chief Complaint  Patient presents with   Palpitations        Shortness of Breath    Katherine Parker is a 57 y.o. female hx of hypertension, diastolic heart failure here presenting with shortness of breath.  Patient has been having shortness of breath with exertion for the last week or so.  Patient states that he got progressively worse and now whenever she walks to the bathroom she gets short of breath.  She also has some leg swelling as well.  Denies any fevers or chills or sick contacts.  The history is provided by the patient.       Home Medications Prior to Admission medications   Medication Sig Start Date End Date Taking? Authorizing Provider  acetaminophen-codeine (TYLENOL #3) 300-30 MG tablet Take 1 tablet by mouth every 4 (four) hours as needed for moderate pain. 03/21/22   Loyola Mast, MD  albuterol (PROVENTIL) (2.5 MG/3ML) 0.083% nebulizer solution Take 3 mLs (2.5 mg total) by nebulization every 6 (six) hours as needed for wheezing or shortness of breath. 11/26/21   Cristopher Peru, PA-C  albuterol (VENTOLIN HFA) 108 (90 Base) MCG/ACT inhaler INHALE 1-2 PUFFS BY MOUTH EVERY 6 HOURS AS NEEDED FOR WHEEZE OR SHORTNESS OF BREATH 05/12/22   Loyola Mast, MD  amLODipine (NORVASC) 5 MG tablet Take 1 tablet (5 mg total) by mouth daily. 05/18/22   Loyola Mast, MD  busPIRone (BUSPAR) 5 MG tablet Take 1 tablet (5 mg total) by mouth 2 (two) times daily. 04/04/22   Loyola Mast, MD  cetirizine (ZYRTEC) 10 MG tablet Take 1 tablet (10 mg total) by mouth daily. 04/18/22   Loyola Mast, MD  ibuprofen (ADVIL) 200 MG tablet Take 400 mg by mouth every 6 (six) hours as needed for mild pain.    [provider]  Melatonin 10 MG TABS Take 10 mg by mouth at bedtime as needed (sleep).    [provider]  metoprolol succinate (TOPROL XL)  50 MG 24 hr tablet Take 0.5 tablets (25 mg total) by mouth daily. 09/08/21   Loyola Mast, MD  omeprazole (PRILOSEC) 20 MG capsule Take 1 capsule (20 mg total) by mouth daily. 05/18/22   Loyola Mast, MD  predniSONE (DELTASONE) 20 MG tablet Take 1 tablet (20 mg total) by mouth daily with breakfast. 04/18/22   Loyola Mast, MD  prochlorperazine (COMPAZINE) 5 MG tablet Take 1 tablet (5 mg total) by mouth every 6 (six) hours as needed for nausea or vomiting. 12/20/21   Loyola Mast, MD  terbinafine (LAMISIL) 250 MG tablet Take 1 tablet (250 mg total) by mouth daily. 11/29/21   Loyola Mast, MD  venlafaxine XR (EFFEXOR XR) 75 MG 24 hr capsule Take 1 capsule (75 mg total) by mouth daily with breakfast. Take togther with 150 mg dose (total dose= 225 mg) 11/29/21   Loyola Mast, MD  venlafaxine XR (EFFEXOR-XR) 150 MG 24 hr capsule Take 1 capsule (150 mg total) by mouth daily with breakfast. Take together with 75 mg dose (total dose= 225 mg) 11/29/21   Loyola Mast, MD      Allergies    Penicillin g and Penicillins    Review of Systems   Review of Systems  Respiratory:  Positive for shortness of breath.  Cardiovascular:  Positive for palpitations.  All other systems reviewed and are negative.   Physical Exam Updated Vital Signs BP (!) 202/98 (BP Location: Right Arm)   Pulse 97   Temp 98.9 F (37.2 C) (Oral)   Resp (!) 26   Ht 5\' 6"  (1.676 m)   Wt (!) 159 kg   SpO2 100%   BMI 56.58 kg/m  Physical Exam Vitals and nursing note reviewed.  Constitutional:      Comments: Tachypneic  HENT:     Head: Normocephalic.     Mouth/Throat:     Mouth: Mucous membranes are moist.     Pharynx: Oropharynx is clear.  Eyes:     Extraocular Movements: Extraocular movements intact.     Pupils: Pupils are equal, round, and reactive to light.  Cardiovascular:     Rate and Rhythm: Normal rate and regular rhythm.  Pulmonary:     Comments: Tachypneic and diminished bilateral bases.  No  obvious wheezing or crackles Musculoskeletal:        General: Normal range of motion.     Cervical back: Normal range of motion and neck supple.     Comments: 1 + edema   Skin:    General: Skin is warm.     Capillary Refill: Capillary refill takes less than 2 seconds.  Neurological:     General: No focal deficit present.     Mental Status: She is oriented to person, place, and time.  Psychiatric:        Mood and Affect: Mood normal.        Behavior: Behavior normal.     ED Results / Procedures / Treatments   Labs (all labs ordered are listed, but only abnormal results are displayed) Labs Reviewed  SARS CORONAVIRUS 2 BY RT PCR  CBC WITH DIFFERENTIAL/PLATELET  COMPREHENSIVE METABOLIC PANEL  BRAIN NATRIURETIC PEPTIDE  BLOOD GAS, VENOUS  I-STAT CHEM 8, ED  TROPONIN I (HIGH SENSITIVITY)    EKG None  Radiology No results found.  Procedures Procedures    Medications Ordered in ED Medications  hydrALAZINE (APRESOLINE) injection 10 mg (has no administration in time range)  ipratropium-albuterol (DUONEB) 0.5-2.5 (3) MG/3ML nebulizer solution 3 mL (3 mLs Nebulization Given 07/30/22 1600)    ED Course/ Medical Decision Making/ A&P                             Medical Decision Making Katherine Parker is a 57 y.o. female here with shortness of breath.  She has shortness of breath with exertion.  Consider heart failure or ACS or hypertensive urgency.  Symptoms going on for a week and I do not think she has a dissection.  Plan to get CBC and CMP and BNP and chest x-ray.  Patient's blood pressure is 200 and will give hydralazine.  7:57 PM Patient's blood pressure is down to 180s.  I reviewed patient's labs and independently interpreted imaging study.  Patient's labs and VBG unremarkable.  COVID test is negative.  Troponin negative x 2 and BNP normal.  Chest x-ray is clear.  Patient is feeling better.  Patient has not been taking her Norvasc we will represcribe her Norvasc and have her  follow-up with her PCP.  She likely has symptomatic hypertension  Problems Addressed: Hypertension, unspecified type: chronic illness or injury Shortness of breath: acute illness or injury  Amount and/or Complexity of Data Reviewed Labs: ordered. Decision-making details documented in ED Course. Radiology:  ordered and independent interpretation performed. Decision-making details documented in ED Course. ECG/medicine tests: ordered and independent interpretation performed. Decision-making details documented in ED Course.  Risk Prescription drug management.    Final Clinical Impression(s) / ED Diagnoses Final diagnoses:  None    Rx / DC Orders ED Discharge Orders     None         Charlynne Pander, MD 07/30/22 1958

## 2022-08-01 ENCOUNTER — Ambulatory Visit
Admission: RE | Admit: 2022-08-01 | Discharge: 2022-08-01 | Disposition: A | Discharge status: Home / Self Care | Payer: Medicare Other | Source: Ambulatory Visit | Attending: Family Medicine | Admitting: Family Medicine

## 2022-08-01 ENCOUNTER — Telehealth: Payer: Self-pay

## 2022-08-01 ENCOUNTER — Other Ambulatory Visit (HOSPITAL_COMMUNITY): Payer: Self-pay | Admitting: Diagnostic Radiology

## 2022-08-01 DIAGNOSIS — N6314 Unspecified lump in the right breast, lower inner quadrant: Secondary | ICD-10-CM | POA: Diagnosis not present

## 2022-08-01 DIAGNOSIS — N6310 Unspecified lump in the right breast, unspecified quadrant: Secondary | ICD-10-CM

## 2022-08-01 DIAGNOSIS — D241 Benign neoplasm of right breast: Secondary | ICD-10-CM | POA: Diagnosis not present

## 2022-08-01 HISTORY — PX: BREAST BIOPSY: SHX20

## 2022-08-01 NOTE — Transitions of Care (Post Inpatient/ED Visit) (Signed)
   08/01/2022  Name: Katherine Parker MRN: [id number] DOB: December 09, 1965  Today's TOC FU Call Status: Today's TOC FU Call Status:: Successful TOC FU Call Competed TOC FU Call Complete Date: 08/01/22  Transition Care Management Follow-up Telephone Call Date of Discharge: 07/30/22 Discharge Facility: [facility]) Type of Discharge: Emergency Department How have you been since you were released from the hospital?: Better Any questions or concerns?: No  Items Reviewed: Did you receive and understand the discharge instructions provided?: Yes Any new allergies since your discharge?: No Dietary orders reviewed?: No  Medications Reviewed Today: Medications Reviewed Today   Medications were not reviewed in this encounter     Home Care and Equipment/Supplies: Were Home Health Services Ordered?: NA Any new equipment or medical supplies ordered?: NA  Functional Questionnaire: Do you need assistance with bathing/showering or dressing?: No Do you need assistance with meal preparation?: No Do you need assistance with eating?: No Do you have difficulty maintaining continence: No Do you need assistance with getting out of bed/getting out of a chair/moving?: No Do you have difficulty managing or taking your medications?: No  Follow up appointments reviewed: PCP Follow-up appointment confirmed?: Yes Date of PCP follow-up appointment?: 08/04/22 Follow-up Provider: [facility] Follow-up appointment confirmed?: NA Do you need transportation to your follow-up appointment?: No Do you understand care options if your condition(s) worsen?: Yes-patient verbalized understanding    SIGNATURE Arvil Persons, BSN, RN

## 2022-08-01 NOTE — Transitions of Care (Post Inpatient/ED Visit) (Signed)
@  [email or web address]@  08/01/2022  Name: Katherine Parker MRN: [id number] DOB: May 16, 1965  Today's TOC FU Call Status: Today's TOC FU Call Status:: Unsuccessul Call (1st Attempt) Unsuccessful Call (1st Attempt) Date: 08/01/22  Attempted to reach the patient regarding the most recent Inpatient/ED visit.  Follow Up Plan: Additional outreach attempts will be made to reach the patient to complete the Transitions of Care (Post Inpatient/ED visit) call.   Signature Leilynn Pilat CMA

## 2022-08-03 NOTE — Telephone Encounter (Signed)
Called patient. Voice mail box was full, unable to leave VM in regards to reschedule hospital colonoscopy. Patient hasn't read MyChart message that was sent on 7/26 to call us back rescheduling the procedure. Letter sent to patient's home address.

## 2022-08-04 ENCOUNTER — Encounter: Payer: Self-pay | Admitting: Family Medicine

## 2022-08-04 ENCOUNTER — Ambulatory Visit (INDEPENDENT_AMBULATORY_CARE_PROVIDER_SITE_OTHER): Payer: Medicare Other | Admitting: Family Medicine

## 2022-08-04 VITALS — BP 134/82 | HR 81 | Temp 98.2°F | Ht 66.0 in | Wt 350.4 lb

## 2022-08-04 DIAGNOSIS — D249 Benign neoplasm of unspecified breast: Secondary | ICD-10-CM | POA: Insufficient documentation

## 2022-08-04 DIAGNOSIS — I422 Other hypertrophic cardiomyopathy: Secondary | ICD-10-CM | POA: Diagnosis not present

## 2022-08-04 DIAGNOSIS — I471 Supraventricular tachycardia, unspecified: Secondary | ICD-10-CM

## 2022-08-04 DIAGNOSIS — R0600 Dyspnea, unspecified: Secondary | ICD-10-CM

## 2022-08-04 DIAGNOSIS — B351 Tinea unguium: Secondary | ICD-10-CM

## 2022-08-04 DIAGNOSIS — I10 Essential (primary) hypertension: Secondary | ICD-10-CM | POA: Diagnosis not present

## 2022-08-04 DIAGNOSIS — G44329 Chronic post-traumatic headache, not intractable: Secondary | ICD-10-CM | POA: Insufficient documentation

## 2022-08-04 DIAGNOSIS — F418 Other specified anxiety disorders: Secondary | ICD-10-CM

## 2022-08-04 MED ORDER — BUSPIRONE HCL 5 MG PO TABS
5.0000 mg | ORAL_TABLET | Freq: Two times a day (BID) | ORAL | 2 refills | Status: DC
Start: 2022-08-04 — End: 2023-01-12

## 2022-08-04 MED ORDER — TERBINAFINE HCL 250 MG PO TABS: 250.0000 mg | ORAL_TABLET | Freq: Every day | ORAL | 0 refills | Status: DC

## 2022-08-04 MED ORDER — METOPROLOL SUCCINATE ER 50 MG PO TB24
25.0000 mg | ORAL_TABLET | Freq: Every day | ORAL | 3 refills | Status: DC
Start: 2022-08-04 — End: 2023-10-24

## 2022-08-04 MED ORDER — VENLAFAXINE HCL ER 75 MG PO CP24
75.0000 mg | ORAL_CAPSULE | Freq: Every day | ORAL | 3 refills | Status: DC
Start: 2022-08-04 — End: 2023-01-12

## 2022-08-04 MED ORDER — VENLAFAXINE HCL ER 150 MG PO CP24
150.0000 mg | ORAL_CAPSULE | Freq: Every day | ORAL | 3 refills | Status: DC
Start: 2022-08-04 — End: 2023-01-12

## 2022-08-04 MED ORDER — PROCHLORPERAZINE MALEATE 5 MG PO TABS
5.0000 mg | ORAL_TABLET | Freq: Four times a day (QID) | ORAL | 0 refills | Status: DC | PRN
Start: 2022-08-04 — End: 2023-10-24

## 2022-08-04 NOTE — Assessment & Plan Note (Signed)
Katherine Parker is having some DOE and has mild edema. I will refer her back to Dr. Jens Som for ongoing monitoring for any progressive heart failure. I believe her current issues may represent strain rather than actual heart failure, esp. in light of the normal BNP.

## 2022-08-04 NOTE — Progress Notes (Signed)
[facility] [address] [place] [address] Dept: [phone] Dept Fax: [phone]  Office Visit  Subjective:    Patient ID: Katherine Parker, female    DOB: Jul 27, 1965, 57 y.o..   MRN: [id number]  Chief Complaint  Patient presents with   Hospitalization Follow-up    Hospital f/u from 07/30/22 for palpitations.  Having SOB with activity.    History of Present Illness:  Patient is in today for follow-up from a recent ED visit. She was seen at [facility] ED on 7/29 due to a 35-month history of worsening dyspnea with exertion. She admitted to intermittent leg swelling. she had been off of her amlodipine for about a month. She notes she thought she might not need to continue this medicine. She is now back on her amlodipine 5 mg daily. She still feels some mild dyspnea. She has also had some intermittent palpitations. [name] has a history of a hypertrophic cardiomyopathy and PSVT.  Past Medical History: Patient Active Problem List   Diagnosis Date Noted   Chronic post-traumatic headache, not intractable 08/04/2022   Gastroesophageal reflux disease without esophagitis 05/18/2022   Chronic constipation 12/08/2021   History of bulimia nervosa 12/08/2021   Vitiligo 07/27/2021   Obstructive sleep apnea 03/24/2021   Stutter 03/24/2021   Paroxysmal SVT (supraventricular tachycardia) 11/18/2020   Normocytic anemia 10/20/2020   Spondylolisthesis at L5-S1 level 10/20/2020   Prediabetes 10/20/2020   Hypertrophic cardiomyopathy (HCC) 10/13/2020   Morbid obesity with BMI of 50.0-59.9, adult (HCC) 10/13/2020   Essential hypertension 10/13/2020   Chronic right-sided low back pain with right-sided sciatica 10/13/2020   Borderline hyperlipidemia 03/09/2020   Insomnia 03/09/2020   Mild intermittent asthma 03/09/2020   Moderate recurrent major depression (HCC) 03/09/2020   Past Surgical History:  Procedure Laterality Date   BREAST BIOPSY Right  08/01/2022   [place] RT BREAST BX W LOC DEV 1ST LESION IMG BX SPEC US GUIDE 08/01/2022 [facility] MAMMOGRAPHY   CESAREAN SECTION     GUM SURGERY     TUBAL LIGATION     Family History  Problem Relation Age of Onset   Heart disease Mother    COPD Mother    Congestive Heart Failure Mother    Hypertension Mother    Diabetes Mother    Dementia Father    Breast cancer Sister    Colitis Sister    Diabetes Maternal Grandmother    Colon cancer Maternal Grandmother    Dementia Paternal Grandmother    Dementia Paternal Grandfather    Crohn's disease Daughter    Cancer Half-Sister        Breast, brain metastasis   Cancer Half-Sister        Uterine   Colon cancer Other        mat great gm   Esophageal cancer Maternal Aunt    Outpatient Medications Prior to Visit  Medication Sig Dispense Refill   albuterol (PROVENTIL) (2.5 MG/3ML) 0.083% nebulizer solution Take 3 mLs (2.5 mg total) by nebulization every 6 (six) hours as needed for wheezing or shortness of breath. 75 mL 12   albuterol (VENTOLIN HFA) 108 (90 Base) MCG/ACT inhaler INHALE 1-2 PUFFS BY MOUTH EVERY 6 HOURS AS NEEDED FOR WHEEZE OR SHORTNESS OF BREATH (Patient taking differently: Inhale 1-2 puffs into the lungs every 6 (six) hours as needed for wheezing or shortness of breath.) 18 each 1   amLODipine (NORVASC) 5 MG tablet Take 1 tablet (5 mg total) by mouth daily. 90 tablet  1   cetirizine (ZYRTEC) 10 MG tablet Take 1 tablet (10 mg total) by mouth daily. (Patient taking differently: Take 10 mg by mouth daily as needed for allergies or rhinitis.) 30 tablet 11   ibuprofen (ADVIL) 200 MG tablet Take 400 mg by mouth every 6 (six) hours as needed for mild pain.     omeprazole (PRILOSEC) 20 MG capsule Take 1 capsule (20 mg total) by mouth daily. (Patient taking differently: Take 20 mg by mouth daily as needed (for reflux).) 30 capsule 3   metoprolol succinate (TOPROL XL) 50 MG 24 hr tablet Take 0.5 tablets (25 mg total) by mouth daily. 90 tablet 3    prochlorperazine (COMPAZINE) 5 MG tablet Take 1 tablet (5 mg total) by mouth every 6 (six) hours as needed for nausea or vomiting. 30 tablet 0   terbinafine (LAMISIL) 250 MG tablet Take 1 tablet (250 mg total) by mouth daily. 90 tablet 0   venlafaxine XR (EFFEXOR XR) 75 MG 24 hr capsule Take 1 capsule (75 mg total) by mouth daily with breakfast. Take togther with 150 mg dose (total dose= 225 mg) 90 capsule 3   venlafaxine XR (EFFEXOR-XR) 150 MG 24 hr capsule Take 1 capsule (150 mg total) by mouth daily with breakfast. Take together with 75 mg dose (total dose= 225 mg) 90 capsule 3   acetaminophen-codeine (TYLENOL #3) 300-30 MG tablet Take 1 tablet by mouth every 4 (four) hours as needed for moderate pain. (Patient not taking: Reported on 07/30/2022) 10 tablet 0   busPIRone (BUSPAR) 5 MG tablet Take 1 tablet (5 mg total) by mouth 2 (two) times daily. (Patient not taking: Reported on 08/04/2022) 180 tablet 2   Melatonin 10 MG TABS Take 10 mg by mouth at bedtime as needed (sleep).     predniSONE (DELTASONE) 20 MG tablet Take 1 tablet (20 mg total) by mouth daily with breakfast. (Patient not taking: Reported on 07/30/2022) 7 tablet 0   No facility-administered medications prior to visit.   Allergies  Allergen Reactions   Penicillins Other (See Comments)    Patient is unsure if she is still allergic to this in 2024- from childhood      Objective:   Today's Vitals   08/04/22 0918  BP: 134/82  Pulse: 81  Temp: 98.2 F (36.8 C)  TempSrc: Temporal  SpO2: 98%  Weight: (!) 350 lb 6.4 oz (158.9 kg)  Height: 5\' 6"  (1.676 m)   Body mass index is 56.56 kg/m.   General: Well developed, well nourished. No acute distress. CV: RRR without murmurs or rubs. Pulses 2+ bilaterally. Feet: Nails remain thickened and discolored. They are currently quite long and curving over the distal end of the   toes. Psych: Alert and oriented. Normal mood and affect.  Health Maintenance Due  Topic Date Due   HIV  Screening  Never done   Hepatitis C Screening  Never done   Zoster Vaccines- Shingrix (1 of 2) Never done   EKG: Normal sinus rhythm (rate = 68) with left atrial enlargement and possible LVH.  Lab Results    Latest Ref Rng & Units 07/30/2022    4:20 PM 07/30/2022    4:09 PM 12/20/2021    2:05 PM  CBC  WBC 4.0 - 10.5 K/uL  6.0  6.2   Hemoglobin 12.0 - 15.0 g/dL 60.6  30.1  60.1   Hematocrit 36.0 - 46.0 % 37.0  37.5  38.4   Platelets 150 - 400 K/uL  256  324.0        Latest Ref Rng & Units 07/30/2022    4:20 PM 07/30/2022    4:09 PM 12/20/2021    2:05 PM  CMP  Glucose 70 - 99 mg/dL 621  308  657   BUN 6 - 20 mg/dL 16  17  12    Creatinine 0.44 - 1.00 mg/dL 8.46  9.62  9.52   Sodium 135 - 145 mmol/L 141  139  140   Potassium 3.5 - 5.1 mmol/L 3.8  3.8  3.7   Chloride 98 - 111 mmol/L 105  107  100   CO2 22 - 32 mmol/L  25  29   Calcium 8.9 - 10.3 mg/dL  9.2  9.9   Total Protein 6.5 - 8.1 g/dL  7.6  7.6   Total Bilirubin 0.3 - 1.2 mg/dL  0.3  0.6   Alkaline Phos 38 - 126 U/L  87  98   AST 15 - 41 U/L  19  18   ALT 0 - 44 U/L  11  9    Component Ref Range & Units 5 d ago (07/30/22) 8 mo ago (11/26/21) 8 mo ago (11/16/21) 1 yr ago (10/06/20) 1 yr ago (08/22/20)  B Natriuretic Peptide 0.0 - 100.0 pg/mL 42.1 22.4 CM 20.3 CM 24.9 CM 21.5 CM      Assessment & Plan:   Problem List Items Addressed This Visit       Cardiovascular and Mediastinum   Essential hypertension    BP is improved compared to the ED. It appears she may have not been taking her metoprolol consistently, based on refill history. I recommend she resume metoprolol succinate 50 mg daily and amlodipine 5 mg daily.      Relevant Medications   metoprolol succinate (TOPROL XL) 50 MG 24 hr tablet   Other Relevant Orders   Ambulatory referral to Cardiology   Hypertrophic cardiomyopathy (HCC)    Ms. Hirose is having some DOE and has mild edema. I will refer her back to Dr. Jens Som for ongoing monitoring for any  progressive heart failure. I believe her current issues may represent strain rather than actual heart failure, esp. in light of the normal BNP.      Relevant Medications   metoprolol succinate (TOPROL XL) 50 MG 24 hr tablet   Other Relevant Orders   Ambulatory referral to Cardiology   Paroxysmal SVT (supraventricular tachycardia)    Ms. Mcspadden has noted some palpitations recently. I suspect this has been being off of the metoprolol. I recommend she resume this.      Relevant Medications   metoprolol succinate (TOPROL XL) 50 MG 24 hr tablet   Other Relevant Orders   Ambulatory referral to Cardiology     Other   Chronic post-traumatic headache, not intractable    Stable. I will renew her prochlorperazine for intermittent use.      Relevant Medications   metoprolol succinate (TOPROL XL) 50 MG 24 hr tablet   prochlorperazine (COMPAZINE) 5 MG tablet   venlafaxine XR (EFFEXOR XR) 75 MG 24 hr capsule   venlafaxine XR (EFFEXOR-XR) 150 MG 24 hr capsule   Other Visit Diagnoses     Dyspnea, unspecified type    -  Primary   Likely due to uncontroleld HTN and cardiac strain.   Relevant Orders   EKG 12-Lead   Ambulatory referral to Cardiology   Depression with anxiety       Stable. Continue buspirone 5 mg bid and  venlafaxine 225 mg daily.   Relevant Medications   busPIRone (BUSPAR) 5 MG tablet   venlafaxine XR (EFFEXOR XR) 75 MG 24 hr capsule   venlafaxine XR (EFFEXOR-XR) 150 MG 24 hr capsule   Onychomycosis       Recommend she follow up with podiatry.   Relevant Medications   terbinafine (LAMISIL) 250 MG tablet       No follow-ups on file.   Loyola Mast, MD

## 2022-08-04 NOTE — Assessment & Plan Note (Signed)
Katherine Parker has noted some palpitations recently. I suspect this has been being off of the metoprolol. I recommend she resume this.

## 2022-08-04 NOTE — Assessment & Plan Note (Signed)
Stable. I will renew her prochlorperazine for intermittent use.

## 2022-08-04 NOTE — Assessment & Plan Note (Signed)
BP is improved compared to the ED. It appears she may have not been taking her metoprolol consistently, based on refill history. I recommend she resume metoprolol succinate 50 mg daily and amlodipine 5 mg daily.

## 2022-08-11 NOTE — Progress Notes (Deleted)
Katherine Humphrey, MD Reason for referral-SVT, dyspnea and hypertension  HPI: 57 year old female for evaluation of dyspnea and hypertension at request of Katherine Drape, MD.  Patient last seen November 19, 2020.  Echocardiogram June 2022 showed normal LV function, moderate left ventricular hypertrophy, mild biatrial enlargement.  Venous Dopplers November 2023 showed no DVT.  Patient seen at [facility] emergency room July 29 with dyspnea on exertion.  Initial blood pressure 202/98.  CTA July 2024 showed no central pulmonary embolus, dilated main pulmonary artery suggestive of pulmonary hypertension.  Patient seen recently with dyspnea.  Troponin was normal as well as BNP.  Creatinine 0.56 with normal liver functions.  Hemoglobin 11.5 with MCV 87.8.  Current Outpatient Medications  Medication Sig Dispense Refill   albuterol (PROVENTIL) (2.5 MG/3ML) 0.083% nebulizer solution Take 3 mLs (2.5 mg total) by nebulization every 6 (six) hours as needed for wheezing or shortness of breath. 75 mL 12   albuterol (VENTOLIN HFA) 108 (90 Base) MCG/ACT inhaler INHALE 1-2 PUFFS BY MOUTH EVERY 6 HOURS AS NEEDED FOR WHEEZE OR SHORTNESS OF BREATH (Patient taking differently: Inhale 1-2 puffs into the lungs every 6 (six) hours as needed for wheezing or shortness of breath.) 18 each 1   amLODipine (NORVASC) 5 MG tablet Take 1 tablet (5 mg total) by mouth daily. 90 tablet 1   busPIRone (BUSPAR) 5 MG tablet Take 1 tablet (5 mg total) by mouth 2 (two) times daily. 180 tablet 2   cetirizine (ZYRTEC) 10 MG tablet Take 1 tablet (10 mg total) by mouth daily. (Patient taking differently: Take 10 mg by mouth daily as needed for allergies or rhinitis.) 30 tablet 11   ibuprofen (ADVIL) 200 MG tablet Take 400 mg by mouth every 6 (six) hours as needed for mild pain.     metoprolol succinate (TOPROL XL) 50 MG 24 hr tablet Take 0.5 tablets (25 mg total) by mouth daily. 90 tablet 3   omeprazole (PRILOSEC) 20 MG capsule Take 1  capsule (20 mg total) by mouth daily. (Patient taking differently: Take 20 mg by mouth daily as needed (for reflux).) 30 capsule 3   prochlorperazine (COMPAZINE) 5 MG tablet Take 1 tablet (5 mg total) by mouth every 6 (six) hours as needed for nausea or vomiting. 30 tablet 0   terbinafine (LAMISIL) 250 MG tablet Take 1 tablet (250 mg total) by mouth daily. 90 tablet 0   venlafaxine XR (EFFEXOR XR) 75 MG 24 hr capsule Take 1 capsule (75 mg total) by mouth daily with breakfast. Take togther with 150 mg dose (total dose= 225 mg) 90 capsule 3   venlafaxine XR (EFFEXOR-XR) 150 MG 24 hr capsule Take 1 capsule (150 mg total) by mouth daily with breakfast. Take together with 75 mg dose (total dose= 225 mg) 90 capsule 3   No current facility-administered medications for this visit.    Allergies  Allergen Reactions   Penicillins Other (See Comments)    Patient is unsure if she is still allergic to this in 2024- from childhood      Past Medical History:  Diagnosis Date   Anxiety    Bulimia    CHF (congestive heart failure) (HCC)    Hypertension    Irritable bowel syndrome    SVT (supraventricular tachycardia)     Past Surgical History:  Procedure Laterality Date   BREAST BIOPSY Right 08/01/2022   [place] RT BREAST BX W LOC DEV 1ST LESION IMG BX SPEC US GUIDE 08/01/2022 [facility]AMMO[facility]APHY  CESAREAN SECTION     GUM SURGERY     TUBAL LIGATION      Social History   Socioeconomic History   Marital status: Divorced    Spouse name: Not on file   Number of children: 2   Years of education: Not on file   Highest education level: High school graduate  Occupational History   Occupation: Unemployed  Tobacco Use   Smoking status: Never   Smokeless tobacco: Never  Vaping Use   Vaping status: Never Used  Substance and Sexual Activity   Alcohol use: Never   Drug use: Never   Sexual activity: Not Currently  Other Topics Concern   Not on file  Social History Narrative   Not on file   Social  Determinants of Health   Financial Resource Strain: Low Risk  (02/25/2022)   Overall Financial Resource Strain (CARDIA)    Difficulty of Paying Living Expenses: Not hard at all  Food Insecurity: No Food Insecurity (02/25/2022)   Hunger Vital Sign    Worried About Running Out of Food in the Last Year: Never true    Ran Out of Food in the Last Year: Never true  Transportation Needs: No Transportation Needs (02/25/2022)   PRAPARE - Administrator, Civil Service (Medical): No    Lack of Transportation (Non-Medical): No  Physical Activity: Inactive (02/25/2022)   Exercise Vital Sign    Days of Exercise per Week: 0 days    Minutes of Exercise per Session: 0 min  Stress: Stress Concern Present (02/25/2022)   [employer] of Occupational Health - Occupational Stress Questionnaire    Feeling of Stress : To some extent  Social Connections: Moderately Isolated (02/23/2021)   Social Connection and Isolation Panel [NHANES]    Frequency of Communication with Friends and Family: Twice a week    Frequency of Social Gatherings with Friends and Family: Twice a week    Attends Religious Services: More than 4 times per year    Active Member of [employer] or Organizations: No    Attends Banker Meetings: Never    Marital Status: Divorced  Catering manager Violence: Not At Risk (02/23/2021)   Humiliation, Afraid, Rape, and Kick questionnaire    Fear of Current or Ex-Partner: No    Emotionally Abused: No    Physically Abused: No    Sexually Abused: No    Family History  Problem Relation Age of Onset   Heart disease Mother    COPD Mother    Congestive Heart Failure Mother    Hypertension Mother    Diabetes Mother    Dementia Father    Breast cancer Sister    Colitis Sister    Diabetes Maternal Grandmother    Colon cancer Maternal Grandmother    Dementia Paternal Grandmother    Dementia Paternal Grandfather    Crohn's disease Daughter    Cancer Half-Sister        Breast,  brain metastasis   Cancer Half-Sister        Uterine   Colon cancer Other        mat great gm   Esophageal cancer Maternal Aunt     ROS: no fevers or chills, productive cough, hemoptysis, dysphasia, odynophagia, melena, hematochezia, dysuria, hematuria, rash, seizure activity, orthopnea, PND, pedal edema, claudication. Remaining systems are negative.  Physical Exam:   There were no vitals taken for this visit.  General:  Well developed/well nourished in NAD Skin warm/dry Patient not depressed No  peripheral clubbing Back-normal HEENT-normal/normal eyelids Neck supple/normal carotid upstroke bilaterally; no bruits; no JVD; no thyromegaly chest - CTA/ normal expansion CV - RRR/normal S1 and S2; no murmurs, rubs or gallops;  PMI nondisplaced Abdomen -NT/ND, no HSM, no mass, + bowel sounds, no bruit 2+ femoral pulses, no bruits Ext-no edema, chords, 2+ DP Neuro-grossly nonfocal  ECG - personally reviewed  A/P  1 supraventricular tachycardia-continue Toprol.  Previously referred to electrophysiology for consideration of ablation.  2 question hypertrophic cardiomyopathy-patient carries this diagnosis but I can find no confirmation.  3 dyspnea-  4 hypertension-  5 history of dilated pulmonary artery suggestive of pulmonary hypertension-this previously was felt secondary to obesity hypoventilation syndrome in combination with sleep apnea.  6 snoring-we previously scheduled her to have a sleep study.  7 morbid obesity-we discussed the importance of weight loss.  Olga Millers, MD

## 2022-08-18 ENCOUNTER — Encounter (INDEPENDENT_AMBULATORY_CARE_PROVIDER_SITE_OTHER): Payer: Self-pay

## 2022-08-19 ENCOUNTER — Ambulatory Visit: Payer: Medicare Other | Admitting: Cardiology

## 2022-08-19 ENCOUNTER — Encounter: Payer: Self-pay | Admitting: Cardiology

## 2022-09-15 ENCOUNTER — Ambulatory Visit: Payer: Medicare Other | Admitting: Family Medicine

## 2022-10-11 ENCOUNTER — Ambulatory Visit (INDEPENDENT_AMBULATORY_CARE_PROVIDER_SITE_OTHER): Payer: Medicare Other | Admitting: Family Medicine

## 2022-10-11 ENCOUNTER — Encounter: Payer: Self-pay | Admitting: Family Medicine

## 2022-10-11 VITALS — BP 124/76 | HR 75 | Temp 98.2°F | Ht 66.0 in | Wt 346.8 lb

## 2022-10-11 DIAGNOSIS — K219 Gastro-esophageal reflux disease without esophagitis: Secondary | ICD-10-CM

## 2022-10-11 DIAGNOSIS — I10 Essential (primary) hypertension: Secondary | ICD-10-CM

## 2022-10-11 DIAGNOSIS — I422 Other hypertrophic cardiomyopathy: Secondary | ICD-10-CM

## 2022-10-11 DIAGNOSIS — E785 Hyperlipidemia, unspecified: Secondary | ICD-10-CM

## 2022-10-11 DIAGNOSIS — Z23 Encounter for immunization: Secondary | ICD-10-CM

## 2022-10-11 DIAGNOSIS — F331 Major depressive disorder, recurrent, moderate: Secondary | ICD-10-CM

## 2022-10-11 DIAGNOSIS — R7303 Prediabetes: Secondary | ICD-10-CM

## 2022-10-11 LAB — LIPID PANEL
Cholesterol: 230 mg/dL — ABNORMAL HIGH (ref 0–200)
HDL: 75 mg/dL (ref >39.00)
LDL Cholesterol: 147 mg/dL — ABNORMAL HIGH (ref 0–99)
NonHDL: 154.87
Total CHOL/HDL Ratio: 3
Triglycerides: 39 mg/dL (ref 0.0–149.0)
VLDL: 7.8 mg/dL (ref 0.0–40.0)

## 2022-10-11 LAB — HEMOGLOBIN A1C: Hgb A1c MFr Bld: 6.1 % (ref 4.6–6.5)

## 2022-10-11 MED ORDER — OMEPRAZOLE 40 MG PO CPDR
40.0000 mg | DELAYED_RELEASE_CAPSULE | Freq: Every day | ORAL | 3 refills | Status: DC
Start: 2022-10-11 — End: 2023-01-12

## 2022-10-11 NOTE — Assessment & Plan Note (Signed)
Katherine Parker' dyspnea is improved. In light of her limited finances currently, I will keep an eye on this for now. I do think she would benefit from a repeat echocardiogram, when she can afford to return to cardiology.

## 2022-10-11 NOTE — Assessment & Plan Note (Signed)
BP is at goal. Continue metoprolol succinate 50 mg 1/2 tab daily and amlodipine 5 mg daily.

## 2022-10-11 NOTE — Assessment & Plan Note (Addendum)
I will check her annual A1c.

## 2022-10-11 NOTE — Assessment & Plan Note (Signed)
I will step her omeprazole dose up to 40 mg daily. I recommend she try Maalox or Mylanta as needed for acute heartburn.

## 2022-10-11 NOTE — Assessment & Plan Note (Signed)
I will check lipids to assess the current status of this.

## 2022-10-11 NOTE — Progress Notes (Signed)
[facility] [address] [place] [address] Dept: [phone] Dept Fax: [phone]  Chronic Care Office Visit  Subjective:    Patient ID: Katherine Parker, female    DOB: [birth year], 57 y.o..   MRN: [id number]  Chief Complaint  Patient presents with   Follow-up    3 month f/u.  Wants to get shingles vaccine.    History of Present Illness:  Patient is in today for reassessment of chronic medical issues.  Katherine Parker has a history of hypertension. She has been managed on metoprolol 50 mg 1/2 tab daily for this and for a history of SVT. She had been noted to have some hypertrophic cardiomyopathy with LVH on an echocardiogram in 2022. She is also on amlodipine 5 mg daily. She had been in the ED earlier this summer with dyspnea. This is doing better. She was unable to follow through with seeing her cardiologist, as she did not have the funds to pay for her co-pay for that visit.  Katherine Parker has a history of depression. She is managed on venlafaxine XR 225 mg (150 mg + 75 mg) daily. She feels her anxiety and depression are improved. She has discussed with me about concerns for her daughter related to alcohol abuse and psychiatric issues. On a positive side, Katherine Parker sister is moving back to live with her, where she can keep a closer eye on her, esp. related to dementia.  Katherine Parker has a history of GERD. She is having some breakthrough heartburn at times. She finds Tums/Rolaids to be constipating.  Past Medical History: Patient Active Problem List   Diagnosis Date Noted   Chronic post-traumatic headache, not intractable 08/04/2022   Fibroadenoma of breast determined by biopsy 08/04/2022   Gastroesophageal reflux disease without esophagitis 05/18/2022   Chronic constipation 12/08/2021   History of bulimia nervosa 12/08/2021   Vitiligo 07/27/2021   Obstructive sleep apnea 03/24/2021   Stutter 03/24/2021   Paroxysmal SVT  (supraventricular tachycardia) (HCC) 11/18/2020   Spondylolisthesis at L5-S1 level 10/20/2020   Prediabetes 10/20/2020   Hypertrophic cardiomyopathy (HCC) 10/13/2020   Morbid obesity with BMI of 50.0-59.9, adult (HCC) 10/13/2020   Essential hypertension 10/13/2020   Chronic right-sided low back pain with right-sided sciatica 10/13/2020   Borderline hyperlipidemia 03/09/2020   Insomnia 03/09/2020   Mild intermittent asthma 03/09/2020   Moderate recurrent major depression (HCC) 03/09/2020   Past Surgical History:  Procedure Laterality Date   BREAST BIOPSY Right 08/01/2022   [place] RT BREAST BX W LOC DEV 1ST LESION IMG BX SPEC US GUIDE 08/01/2022 [facility] MAMMO[facility]AP[facility]   CESAREAN SECTION     GUM SURGERY     TUBAL LIGATION     Family History  Problem Relation Age of Onset   Heart disease Mother    COPD Mother    Congestive Heart Failure Mother    Hypertension Mother    Diabetes Mother    Dementia Father    Breast cancer Sister    Colitis Sister    Diabetes Maternal Grandmother    Colon cancer Maternal Grandmother    Dementia Paternal Grandmother    Dementia Paternal Grandfather    Crohn's disease Daughter    Cancer Half-Sister        Breast, brain metastasis   Cancer Half-Sister        Uterine   Colon cancer Other        mat great gm   Esophageal cancer Maternal Aunt    Outpatient  Medications Prior to Visit  Medication Sig Dispense Refill   albuterol (PROVENTIL) (2.5 MG/3ML) 0.083% nebulizer solution Take 3 mLs (2.5 mg total) by nebulization every 6 (six) hours as needed for wheezing or shortness of breath. 75 mL 12   albuterol (VENTOLIN HFA) 108 (90 Base) MCG/ACT inhaler INHALE 1-2 PUFFS BY MOUTH EVERY 6 HOURS AS NEEDED FOR WHEEZE OR SHORTNESS OF BREATH (Patient taking differently: Inhale 1-2 puffs into the lungs every 6 (six) hours as needed for wheezing or shortness of breath.) 18 each 1   amLODipine (NORVASC) 5 MG tablet Take 1 tablet (5 mg total) by mouth daily. 90 tablet 1    busPIRone (BUSPAR) 5 MG tablet Take 1 tablet (5 mg total) by mouth 2 (two) times daily. 180 tablet 2   cetirizine (ZYRTEC) 10 MG tablet Take 1 tablet (10 mg total) by mouth daily. (Patient taking differently: Take 10 mg by mouth daily as needed for allergies or rhinitis.) 30 tablet 11   ibuprofen (ADVIL) 200 MG tablet Take 400 mg by mouth every 6 (six) hours as needed for mild pain.     metoprolol succinate (TOPROL XL) 50 MG 24 hr tablet Take 0.5 tablets (25 mg total) by mouth daily. 90 tablet 3   prochlorperazine (COMPAZINE) 5 MG tablet Take 1 tablet (5 mg total) by mouth every 6 (six) hours as needed for nausea or vomiting. 30 tablet 0   terbinafine (LAMISIL) 250 MG tablet Take 1 tablet (250 mg total) by mouth daily. 90 tablet 0   venlafaxine XR (EFFEXOR XR) 75 MG 24 hr capsule Take 1 capsule (75 mg total) by mouth daily with breakfast. Take togther with 150 mg dose (total dose= 225 mg) 90 capsule 3   venlafaxine XR (EFFEXOR-XR) 150 MG 24 hr capsule Take 1 capsule (150 mg total) by mouth daily with breakfast. Take together with 75 mg dose (total dose= 225 mg) 90 capsule 3   omeprazole (PRILOSEC) 20 MG capsule Take 1 capsule (20 mg total) by mouth daily. (Patient taking differently: Take 20 mg by mouth daily as needed (for reflux).) 30 capsule 3   No facility-administered medications prior to visit.   Allergies  Allergen Reactions   Penicillins Other (See Comments)    Patient is unsure if she is still allergic to this in 2024- from childhood    Objective:   Today's Vitals   10/11/22 0920  BP: 124/76  Pulse: 75  Temp: 98.2 F (36.8 C)  TempSrc: Temporal  SpO2: 98%  Weight: (!) 346 lb 12.8 oz (157.3 kg)  Height: 5\' 6"  (1.676 m)   Body mass index is 55.97 kg/m.   General: Well developed, well nourished. No acute distress. Psych: Alert and oriented. Normal mood and affect.  Health Maintenance Due  Topic Date Due   HIV Screening  Never done   Hepatitis C Screening  Never done    Zoster Vaccines- Shingrix (1 of 2) Never done     Assessment & Plan:   Problem List Items Addressed This Visit       Cardiovascular and Mediastinum   Essential hypertension - Primary    BP is at goal. Continue metoprolol succinate 50 mg 1/2 tab daily and amlodipine 5 mg daily.      Hypertrophic cardiomyopathy (HCC)    Katherine Parker' dyspnea is improved. In light of her limited finances currently, I will keep an eye on this for now. I do think she would benefit from a repeat echocardiogram, when she can afford to  return to cardiology.        Digestive   Gastroesophageal reflux disease without esophagitis    I will step her omeprazole dose up to 40 mg daily. I recommend she try Maalox or Mylanta as needed for acute heartburn.      Relevant Medications   omeprazole (PRILOSEC) 40 MG capsule     Other   Borderline hyperlipidemia    I will check lipids to assess the current status of this.      Relevant Orders   Lipid panel   Moderate recurrent major depression (HCC)    Stable. Continue venlafaxine 150 mg daily and 75 mg daily (total daily dose =225 mg).      Prediabetes    I will check her annual A1c.      Relevant Orders   Hemoglobin A1c   Other Visit Diagnoses     Need for shingles vaccine       Relevant Orders   Varicella-zoster vaccine IM (Completed)       Return in about 3 months (around 01/11/2023) for Reassessment.   Loyola Mast, MD

## 2022-10-11 NOTE — Assessment & Plan Note (Signed)
Stable. Continue venlafaxine 150 mg daily and 75 mg daily (total daily dose =225 mg).

## 2022-10-26 ENCOUNTER — Other Ambulatory Visit: Payer: Self-pay

## 2022-10-26 DIAGNOSIS — R7303 Prediabetes: Secondary | ICD-10-CM

## 2023-01-12 ENCOUNTER — Encounter: Payer: Self-pay | Admitting: Family Medicine

## 2023-01-12 ENCOUNTER — Ambulatory Visit (INDEPENDENT_AMBULATORY_CARE_PROVIDER_SITE_OTHER): Payer: Medicare Other | Admitting: Family Medicine

## 2023-01-12 VITALS — BP 124/76 | HR 72 | Temp 98.0°F | Ht 66.0 in | Wt 334.6 lb

## 2023-01-12 DIAGNOSIS — K219 Gastro-esophageal reflux disease without esophagitis: Secondary | ICD-10-CM

## 2023-01-12 DIAGNOSIS — B351 Tinea unguium: Secondary | ICD-10-CM

## 2023-01-12 DIAGNOSIS — Z23 Encounter for immunization: Secondary | ICD-10-CM | POA: Diagnosis not present

## 2023-01-12 DIAGNOSIS — F418 Other specified anxiety disorders: Secondary | ICD-10-CM | POA: Diagnosis not present

## 2023-01-12 DIAGNOSIS — J4521 Mild intermittent asthma with (acute) exacerbation: Secondary | ICD-10-CM

## 2023-01-12 DIAGNOSIS — I10 Essential (primary) hypertension: Secondary | ICD-10-CM

## 2023-01-12 DIAGNOSIS — Z6841 Body Mass Index (BMI) 40.0 and over, adult: Secondary | ICD-10-CM

## 2023-01-12 MED ORDER — VENLAFAXINE HCL ER 75 MG PO CP24: 75.0000 mg | ORAL_CAPSULE | Freq: Every day | ORAL | 3 refills | Status: DC

## 2023-01-12 MED ORDER — TERBINAFINE HCL 250 MG PO TABS: 250.0000 mg | ORAL_TABLET | Freq: Every day | ORAL | 0 refills | Status: DC

## 2023-01-12 MED ORDER — AMLODIPINE BESYLATE 5 MG PO TABS: 5.0000 mg | ORAL_TABLET | Freq: Every day | ORAL | 3 refills | Status: DC

## 2023-01-12 MED ORDER — CETIRIZINE HCL 10 MG PO TABS: 10.0000 mg | ORAL_TABLET | Freq: Every day | ORAL | 3 refills | Status: AC | PRN

## 2023-01-12 MED ORDER — OMEPRAZOLE 40 MG PO CPDR: 40.0000 mg | DELAYED_RELEASE_CAPSULE | Freq: Every day | ORAL | 3 refills | Status: AC

## 2023-01-12 MED ORDER — VENLAFAXINE HCL ER 150 MG PO CP24: 150.0000 mg | ORAL_CAPSULE | Freq: Every day | ORAL | 3 refills | Status: DC

## 2023-01-12 MED ORDER — BUSPIRONE HCL 5 MG PO TABS: 5.0000 mg | ORAL_TABLET | Freq: Two times a day (BID) | ORAL | 3 refills | Status: DC

## 2023-01-12 NOTE — Assessment & Plan Note (Signed)
Improved.  Continue omeprazole 40 mg daily.

## 2023-01-12 NOTE — Assessment & Plan Note (Signed)
 I will renew her terbinafine for 3 months.

## 2023-01-12 NOTE — Assessment & Plan Note (Signed)
 Stable. I will renew her cetirizine.

## 2023-01-12 NOTE — Assessment & Plan Note (Signed)
BP is at goal. Continue metoprolol succinate 50 mg 1/2 tab daily and amlodipine 5 mg daily.

## 2023-01-12 NOTE — Addendum Note (Signed)
 Addended by: Loyola Mast on: 01/12/2023 11:17 AM   Modules accepted: Orders

## 2023-01-12 NOTE — Assessment & Plan Note (Signed)
 Stable. Continue venlafaxine XR 225 mg (150 mg + 75 mg) dail and buspirone 5 mg twice a day as needed.

## 2023-01-12 NOTE — Progress Notes (Signed)
 [facility] [address] [place] [address] Dept: [phone] Dept Fax: [phone]  Chronic Care Office Visit  Subjective:    Patient ID: Katherine Parker, female    DOB: [birth year], 58 y.o..   MRN: [id number]  Chief Complaint  Patient presents with   Hypertension    3 month f/u HTN. No concerns.  Not fasting today.    History of Present Illness:  Patient is in today for reassessment of chronic medical issues.  Ms. Uselton has a history of hypertension. She has been managed on metoprolol  50 mg 1/2 tab daily for this and for a history of SVT. She had been noted to have some hypertrophic cardiomyopathy with LVH on an echocardiogram in 2022. She is also on amlodipine  5 mg daily. She is not having any current dyspnea.   Ms. Carta has a history of depression. She is managed on venlafaxine  XR 225 mg (150 mg + 75 mg) dail and buspirone  5 mg twice a day as needed. She feels her anxiety and depression are improved. Ms. Marlis sister has moved back in with her. Although this is stressful, she feels she is managing with this well.    Ms. Genter has a history of GERD. She is managed on omeprazole  40 mg daily.  Ms. Ramo has a history of onychomycosis. She had improvement with terbinafine  use, but notes she has been off of this for some time.   Past Medical History: Patient Active Problem List   Diagnosis Date Noted   Chronic post-traumatic headache, not intractable 08/04/2022   Fibroadenoma of breast determined by biopsy 08/04/2022   Gastroesophageal reflux disease without esophagitis 05/18/2022   Chronic constipation 12/08/2021   History of bulimia nervosa 12/08/2021   Vitiligo 07/27/2021   Obstructive sleep apnea 03/24/2021   Stutter 03/24/2021   Paroxysmal SVT (supraventricular tachycardia) (HCC) 11/18/2020   Spondylolisthesis at L5-S1 level 10/20/2020   Prediabetes 10/20/2020   Hypertrophic cardiomyopathy (HCC) 10/13/2020    Morbid obesity with BMI of 50.0-59.9, adult (HCC) 10/13/2020   Essential hypertension 10/13/2020   Chronic right-sided low back pain with right-sided sciatica 10/13/2020   Borderline hyperlipidemia 03/09/2020   Insomnia 03/09/2020   Mild intermittent asthma 03/09/2020   Moderate recurrent major depression (HCC) 03/09/2020   Past Surgical History:  Procedure Laterality Date   BREAST BIOPSY Right 08/01/2022   US  RT BREAST BX W LOC DEV 1ST LESION IMG BX SPEC US  GUIDE 08/01/2022 [facility] MAMMOGRAPHY   CESAREAN SECTION     GUM SURGERY     TUBAL LIGATION     Family History  Problem Relation Age of Onset   Heart disease Mother    COPD Mother    Congestive Heart Failure Mother    Hypertension Mother    Diabetes Mother    Dementia Father    Breast cancer Sister    Colitis Sister    Diabetes Maternal Grandmother    Colon cancer Maternal Grandmother    Dementia Paternal Grandmother    Dementia Paternal Grandfather    Crohn's disease Daughter    Cancer Half-Sister        Breast, brain metastasis   Cancer Half-Sister        Uterine   Colon cancer Other        mat great gm   Esophageal cancer Maternal Aunt    Outpatient Medications Prior to Visit  Medication Sig Dispense Refill   albuterol  (PROVENTIL ) (2.5 MG/3ML) 0.083% nebulizer solution Take 3 mLs (2.5 mg  total) by nebulization every 6 (six) hours as needed for wheezing or shortness of breath. 75 mL 12   albuterol  (VENTOLIN  HFA) 108 (90 Base) MCG/ACT inhaler INHALE 1-2 PUFFS BY MOUTH EVERY 6 HOURS AS NEEDED FOR WHEEZE OR SHORTNESS OF BREATH (Patient taking differently: Inhale 1-2 puffs into the lungs every 6 (six) hours as needed for wheezing or shortness of breath.) 18 each 1   ibuprofen (ADVIL) 200 MG tablet Take 400 mg by mouth every 6 (six) hours as needed for mild pain.     metoprolol  succinate (TOPROL  XL) 50 MG 24 hr tablet Take 0.5 tablets (25 mg total) by mouth daily. 90 tablet 3   prochlorperazine  (COMPAZINE ) 5 MG tablet Take 1  tablet (5 mg total) by mouth every 6 (six) hours as needed for nausea or vomiting. 30 tablet 0   amLODipine  (NORVASC ) 5 MG tablet Take 1 tablet (5 mg total) by mouth daily. 90 tablet 1   busPIRone  (BUSPAR ) 5 MG tablet Take 1 tablet (5 mg total) by mouth 2 (two) times daily. 180 tablet 2   cetirizine  (ZYRTEC ) 10 MG tablet Take 1 tablet (10 mg total) by mouth daily. (Patient taking differently: Take 10 mg by mouth daily as needed for allergies or rhinitis.) 30 tablet 11   omeprazole  (PRILOSEC) 40 MG capsule Take 1 capsule (40 mg total) by mouth daily. 90 capsule 3   terbinafine  (LAMISIL ) 250 MG tablet Take 1 tablet (250 mg total) by mouth daily. 90 tablet 0   venlafaxine  XR (EFFEXOR  XR) 75 MG 24 hr capsule Take 1 capsule (75 mg total) by mouth daily with breakfast. Take togther with 150 mg dose (total dose= 225 mg) 90 capsule 3   venlafaxine  XR (EFFEXOR -XR) 150 MG 24 hr capsule Take 1 capsule (150 mg total) by mouth daily with breakfast. Take together with 75 mg dose (total dose= 225 mg) 90 capsule 3   No facility-administered medications prior to visit.   Allergies  Allergen Reactions   Penicillins Other (See Comments)    Patient is unsure if she is still allergic to this in 2024- from childhood    Objective:   Today's Vitals   01/12/23 0912  BP: 124/76  Pulse: 72  Temp: 98 F (36.7 C)  TempSrc: Temporal  SpO2: 100%  Weight: (!) 334 lb 9.6 oz (151.8 kg)  Height: 5' 6 (1.676 m)   Body mass index is 54.01 kg/m.   General: Well developed, well nourished. No acute distress. Foot: Most nails are thickened, discolored and show some curving of the nail. Psych: Alert and oriented. Normal mood and affect.  Health Maintenance Due  Topic Date Due   HIV Screening  Never done   Hepatitis C Screening  Never done   Zoster Vaccines- Shingrix  (2 of 2) 12/06/2022   Medicare Annual Wellness (AWV)  02/26/2023      Assessment & Plan:   Problem List Items Addressed This Visit        Cardiovascular and Mediastinum   Essential hypertension - Primary   BP is at goal. Continue metoprolol  succinate 50 mg 1/2 tab daily and amlodipine  5 mg daily.      Relevant Medications   amLODipine  (NORVASC ) 5 MG tablet     Respiratory   Mild intermittent asthma   Stable. I will renew her cetirizine .      Relevant Medications   cetirizine  (ZYRTEC ) 10 MG tablet     Digestive   Gastroesophageal reflux disease without esophagitis   Improved. Continue omeprazole   40 mg daily.      Relevant Medications   omeprazole  (PRILOSEC) 40 MG capsule     Musculoskeletal and Integument   Onychomycosis   I will renew her terbinafine  for 3 months.      Relevant Medications   terbinafine  (LAMISIL ) 250 MG tablet     Other   Depression with anxiety   Stable. Continue venlafaxine  XR 225 mg (150 mg + 75 mg) dail and buspirone  5 mg twice a day as needed.      Relevant Medications   busPIRone  (BUSPAR ) 5 MG tablet   venlafaxine  XR (EFFEXOR  XR) 75 MG 24 hr capsule   venlafaxine  XR (EFFEXOR -XR) 150 MG 24 hr capsule   Morbid obesity with BMI of 50.0-59.9, adult (HCC)   Maximum weight: 380 lbs (02/2022) Current weight: 324 lbs Weight change since last visit: - 12 lbs Total weight loss: -46 lbs (12.1%)  I encourage Ms. Bounds to continue her efforts. She attributes much of this to fasting at times, which she does more for religious reasons.       Other Visit Diagnoses       Need for shingles vaccine       Relevant Orders   Varicella-zoster vaccine IM       Return in about 3 months (around 04/12/2023) for Reassessment.   Garnette CHRISTELLA Simpler, MD

## 2023-01-12 NOTE — Assessment & Plan Note (Signed)
 Maximum weight: 380 lbs (02/2022) Current weight: 324 lbs Weight change since last visit: - 12 lbs Total weight loss: -46 lbs (12.1%)  I encourage Katherine Parker to continue her efforts. She attributes much of this to fasting at times, which she does more for religious reasons.

## 2023-01-22 IMAGING — DX DG CHEST 1V PORT
1 series · 1 of 1 positions shown · non-contrast
Comparison: 10/06/2020

CLINICAL DATA: Shortness of breath

EXAM:
PORTABLE CHEST 1 VIEW

[chest ap]
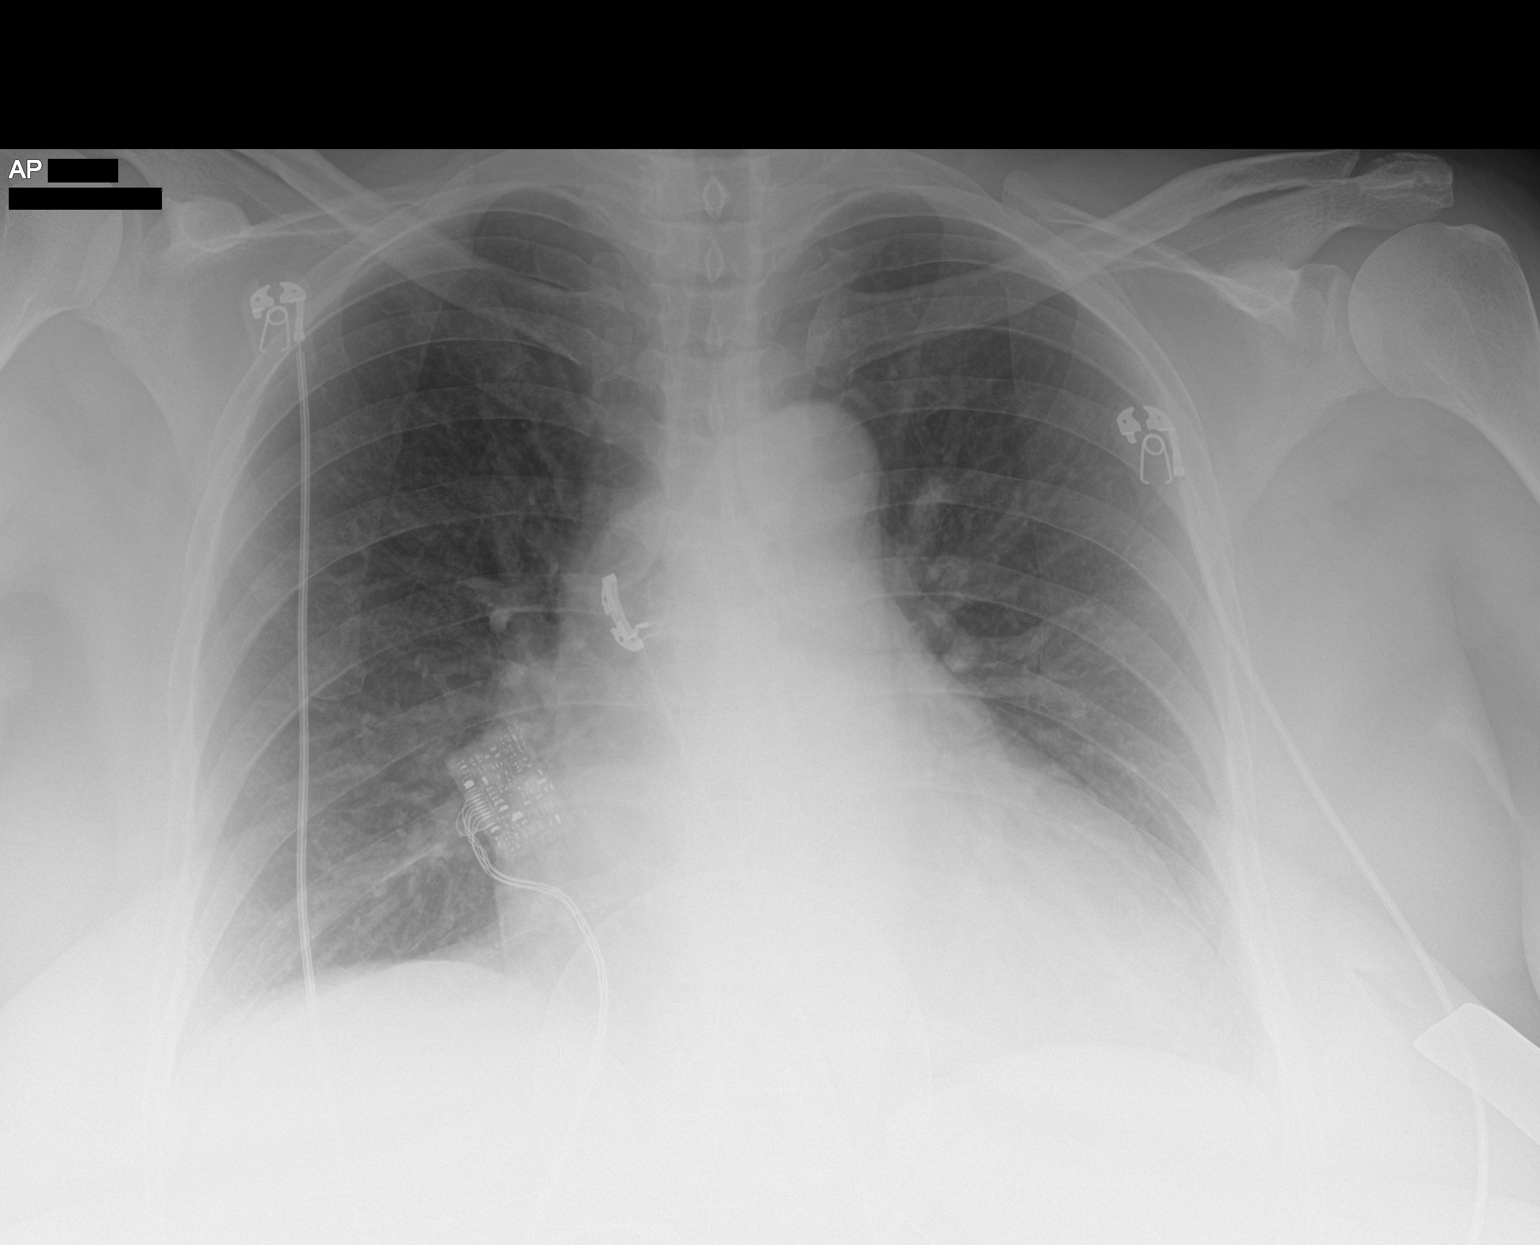

[1 of 1 positions shown; findings below may reference images not displayed]

FINDINGS: Cardiomegaly. Both lungs are clear. The visualized skeletal
structures are unremarkable.
IMPRESSION: Cardiomegaly without acute abnormality of the lungs in AP portable
projection.

## 2023-02-11 IMAGING — MR MR HEAD W/O CM
10 series · 48 of 48 positions shown · non-contrast
Comparison: None.

CLINICAL DATA: Stroke suspected, slurred speech

EXAM:
MRI HEAD WITHOUT CONTRAST
TECHNIQUE: Multiplanar, multiecho pulse sequences of the brain and surrounding
structures were obtained without intravenous contrast.

[Series 5: DWI · axial · 3.0mm · 1.36mm/px · z∈[-27,+114]mm · 9 of 96 slices shown (1 of 2)]
[im 1/96]
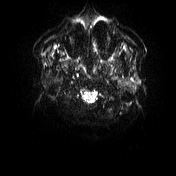
[im 12/96]
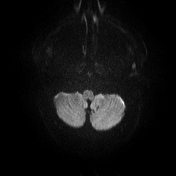
[im 24/96]
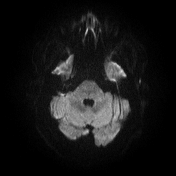
[im 36/96]
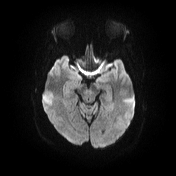
[im 48/96]
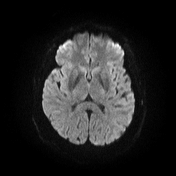
[im 60/96]
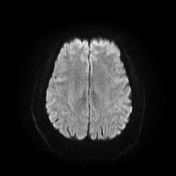
[im 72/96]
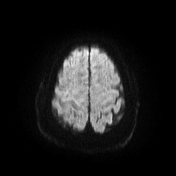
[im 84/96]
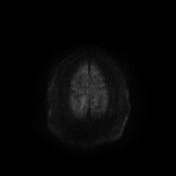
[im 96/96]
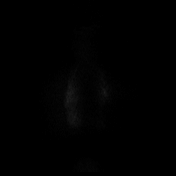

[Series 6: DWI · axial · 3.0mm · 1.36mm/px · z∈[-27,+114]mm · 5 of 48 slices shown (2 of 2)]
[im 1/48]
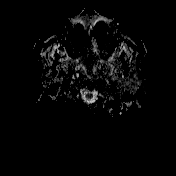
[im 12/48]
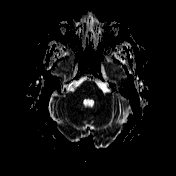
[im 24/48]
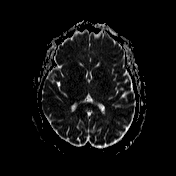
[im 36/48]
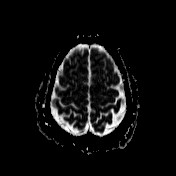
[im 48/48]
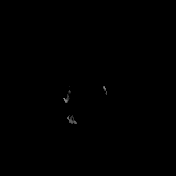

[Series 7: T1 · sagittal · 5.0mm · 0.75mm/px · 2 of 24 slices shown (1 of 2)]
[im 1/24]
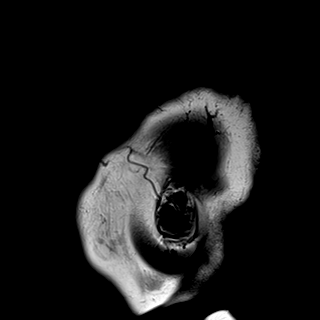
[im 24/24]
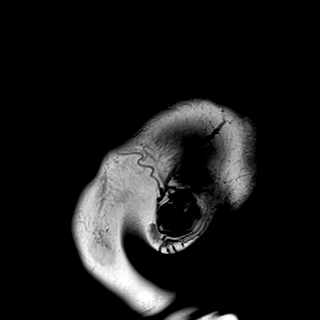

[Series 8: T2 · axial · 5.0mm · 0.62mm/px · z∈[-39,+124]mm · 2 of 26 slices shown (1 of 2)]
[im 1/26]
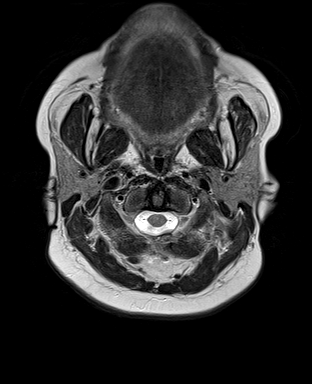
[im 26/26]
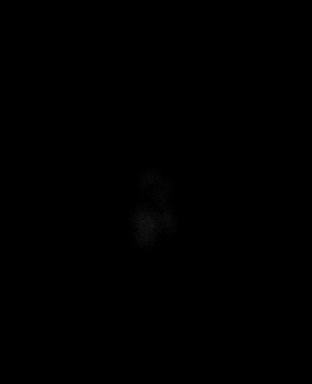

[Series 9: swi_images · axial · 3.0mm · 0.75mm/px · z∈[-40,+125]mm · 5 of 56 slices shown]
[im 1/56]
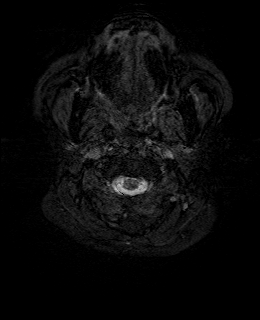
[im 14/56]
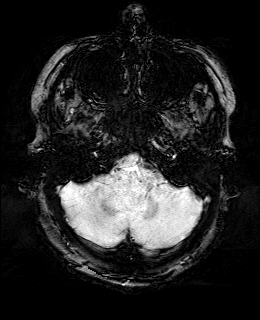
[im 28/56]
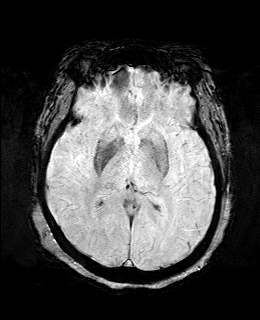
[im 42/56]
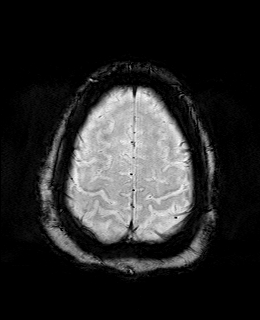
[im 56/56]
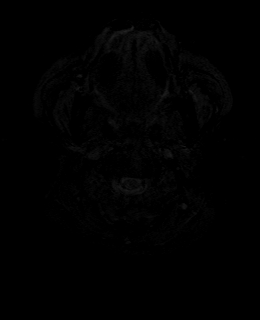

[Series 11: FLAIR · axial · 3.0mm · 0.75mm/px · z∈[-34,+119]mm · 4 of 52 slices shown]
[im 1/52]
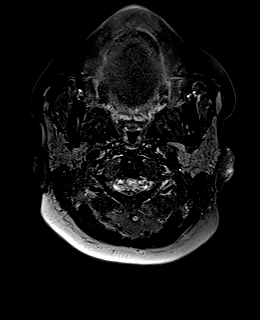
[im 18/52]
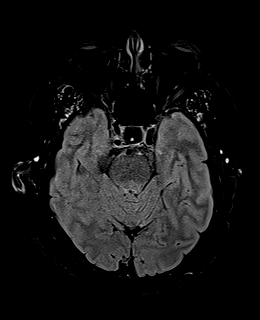
[im 35/52]
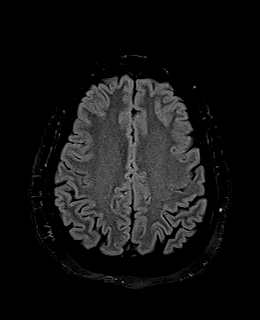
[im 52/52]
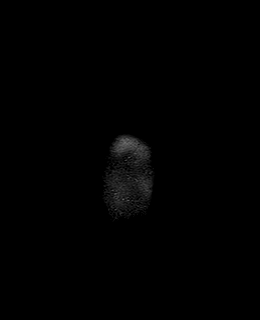

[Series 12: T1 · axial · 1.0mm · 0.94mm/px · z∈[-26,+116]mm · 12 of 144 slices shown (2 of 2)]
[im 1/144]
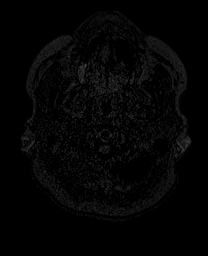
[im 14/144]
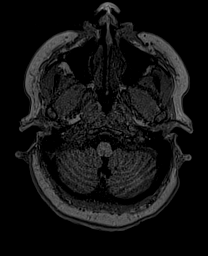
[im 27/144]
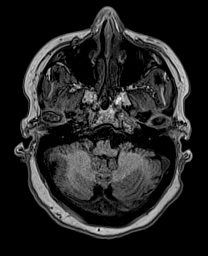
[im 40/144]
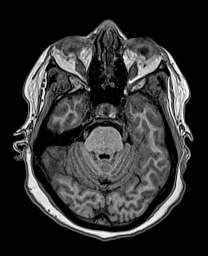
[im 53/144]
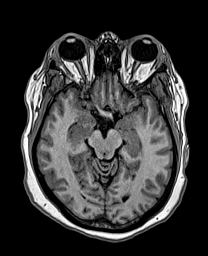
[im 66/144]
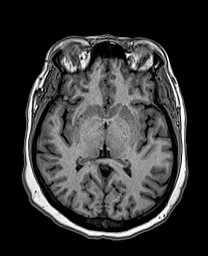
[im 79/144]
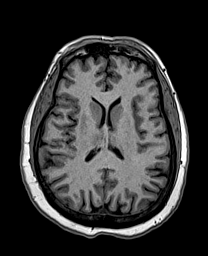
[im 92/144]
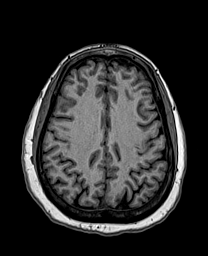
[im 105/144]
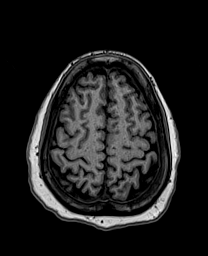
[im 118/144]
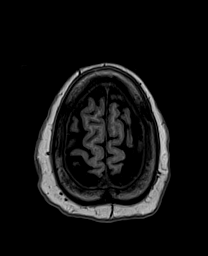
[im 131/144]
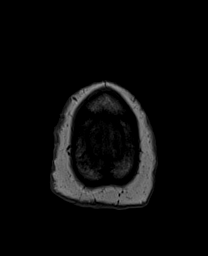
[im 144/144]
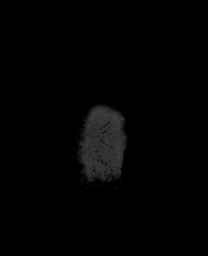

[Series 13: cor dwi_tracew · coronal · 5.0mm · 1.53mm/px · 5 of 56 slices shown]
[im 1/56]
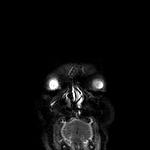
[im 14/56]
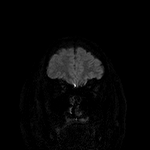
[im 28/56]
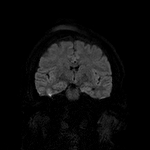
[im 42/56]
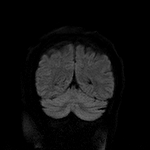
[im 56/56]
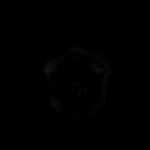

[Series 14: cor dwi_adc · coronal · 5.0mm · 1.53mm/px · 2 of 28 slices shown]
[im 1/28]
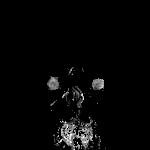
[im 28/28]
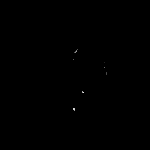

[Series 15: T2 · coronal · 5.0mm · 0.57mm/px · 2 of 28 slices shown (2 of 2)]
[im 1/28]
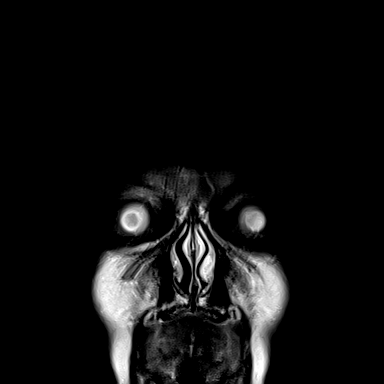
[im 28/28]
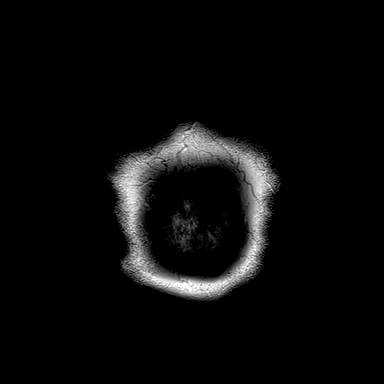

[48 of 48 positions shown; findings below may reference images not displayed]

FINDINGS: Brain: No restricted diffusion to suggest acute or subacute infarct.
No acute hemorrhage, cerebral edema, mass, mass effect, or midline
shift. No hemosiderin deposition to suggest remote hemorrhage.

Vascular: Normal flow voids.

Skull and upper cervical spine: Normal marrow signal.

Sinuses/Orbits: Negative.

Other: The mastoids are well aerated.
IMPRESSION: No acute intracranial process.

## 2023-02-27 ENCOUNTER — Ambulatory Visit (INDEPENDENT_AMBULATORY_CARE_PROVIDER_SITE_OTHER): Payer: Medicare Other

## 2023-02-27 DIAGNOSIS — Z0000 Encounter for general adult medical examination without abnormal findings: Secondary | ICD-10-CM | POA: Diagnosis not present

## 2023-02-27 NOTE — Patient Instructions (Signed)
 [name] , Thank you for taking time to come for your Medicare Wellness Visit. I appreciate your ongoing commitment to your health goals. Please review the following plan we discussed and let me know if I can assist you in the future.   Referrals/Orders/Follow-Ups/Clinician Recommendations: none  This is a list of the screening recommended for you and due dates:  Health Maintenance  Topic Date Due   HIV Screening  Never done   Hepatitis C Screening  Never done   COVID-19 Vaccine (3 - Pfizer risk series) 04/30/2019   Flu Shot  04/03/2023*   Cologuard (Stool DNA test)  02/16/2024   Medicare Annual Wellness Visit  02/27/2024   Mammogram  07/17/2024   Pap with HPV screening  04/28/2026   DTaP/Tdap/Td vaccine (2 - Td or Tdap) 01/20/2031   Pneumococcal Vaccination  Completed   Zoster (Shingles) Vaccine  Completed   HPV Vaccine  Aged Out  *Topic was postponed. The date shown is not the original due date.    Advanced directives: (ACP Link)Information on Advanced Care Planning can be found at [facility] Advance Health Care Directives Advance Health Care Directives ([email or web address]   Next Medicare Annual Wellness Visit scheduled for next year: Yes  insert Preventive Care attachment Insert FALL PREVENTION attachment if needed

## 2023-02-27 NOTE — Progress Notes (Signed)
 Subjective:   Takyla Kuchera is a 58 y.o. who presents for a Medicare Wellness preventive visit.  Visit Complete: Virtual I connected with  Laqueta Jean on 02/27/23 by a audio enabled telemedicine application and verified that I am speaking with the correct person using two identifiers.  Patient Location: Home  Provider Location: Office/Clinic  I discussed the limitations of evaluation and management by telemedicine. The patient expressed understanding and agreed to proceed.  Vital Signs: Because this visit was a virtual/telehealth visit, some criteria may be missing or patient reported. Any vitals not documented were not able to be obtained and vitals that have been documented are patient reported.  VideoError- Librarian, academic were attempted between this provider and patient, however failed, due to patient having technical difficulties OR patient did not have access to video capability.  We continued and completed visit with audio only.   AWV Questionnaire: No: Patient Medicare AWV questionnaire was not completed prior to this visit.  Cardiac Risk Factors include: advanced age (>60men, >23 women);dyslipidemia;hypertension     Objective:    Today's Vitals   There is no height or weight on file to calculate BMI.     02/27/2023    8:13 AM 02/25/2022    8:17 AM 11/26/2021   10:22 AM 02/23/2021    8:23 AM 11/05/2020   12:12 PM 10/06/2020    4:31 PM 08/22/2020    7:45 PM  Advanced Directives  Does Patient Have a Medical Advance Directive? No No No No No No No  Would patient like information on creating a medical advance directive?   Yes (ED - Information included in AVS) No - Patient declined  No - Patient declined     Current Medications (verified) Outpatient Encounter Medications as of 02/27/2023  Medication Sig   albuterol (PROVENTIL) (2.5 MG/3ML) 0.083% nebulizer solution Take 3 mLs (2.5 mg total) by nebulization every 6 (six) hours as needed for  wheezing or shortness of breath.   albuterol (VENTOLIN HFA) 108 (90 Base) MCG/ACT inhaler INHALE 1-2 PUFFS BY MOUTH EVERY 6 HOURS AS NEEDED FOR WHEEZE OR SHORTNESS OF BREATH (Patient taking differently: Inhale 1-2 puffs into the lungs every 6 (six) hours as needed for wheezing or shortness of breath.)   amLODipine (NORVASC) 5 MG tablet Take 1 tablet (5 mg total) by mouth daily.   busPIRone (BUSPAR) 5 MG tablet Take 1 tablet (5 mg total) by mouth 2 (two) times daily.   cetirizine (ZYRTEC) 10 MG tablet Take 1 tablet (10 mg total) by mouth daily as needed for allergies or rhinitis.   ibuprofen (ADVIL) 200 MG tablet Take 400 mg by mouth every 6 (six) hours as needed for mild pain.   metoprolol succinate (TOPROL XL) 50 MG 24 hr tablet Take 0.5 tablets (25 mg total) by mouth daily.   omeprazole (PRILOSEC) 40 MG capsule Take 1 capsule (40 mg total) by mouth daily.   prochlorperazine (COMPAZINE) 5 MG tablet Take 1 tablet (5 mg total) by mouth every 6 (six) hours as needed for nausea or vomiting.   terbinafine (LAMISIL) 250 MG tablet Take 1 tablet (250 mg total) by mouth daily.   venlafaxine XR (EFFEXOR XR) 75 MG 24 hr capsule Take 1 capsule (75 mg total) by mouth daily with breakfast. Take togther with 150 mg dose (total dose= 225 mg)   venlafaxine XR (EFFEXOR-XR) 150 MG 24 hr capsule Take 1 capsule (150 mg total) by mouth daily with breakfast. Take together with 75 mg dose (  total dose= 225 mg)   No facility-administered encounter medications on file as of 02/27/2023.    Allergies (verified) Penicillins   History: Past Medical History:  Diagnosis Date   Anxiety    Bulimia    CHF (congestive heart failure) (HCC)    Hypertension    Irritable bowel syndrome    SVT (supraventricular tachycardia) (HCC)    Past Surgical History:  Procedure Laterality Date   BREAST BIOPSY Right 08/01/2022   [place] RT BREAST BX W LOC DEV 1ST LESION IMG BX SPEC US GUIDE 08/01/2022 [facility]   CESAREAN SECTION      GUM SURGERY     TUBAL LIGATION     Family History  Problem Relation Age of Onset   Heart disease Mother    COPD Mother    Congestive Heart Failure Mother    Hypertension Mother    Diabetes Mother    Dementia Father    Breast cancer Sister    Colitis Sister    Diabetes Maternal Grandmother    Colon cancer Maternal Grandmother    Dementia Paternal Grandmother    Dementia Paternal Grandfather    Crohn's disease Daughter    Cancer Half-Sister        Breast, brain metastasis   Cancer Half-Sister        Uterine   Colon cancer Other        mat great gm   Esophageal cancer Maternal Aunt    Social History   Socioeconomic History   Marital status: Divorced    Spouse name: Not on file   Number of children: 2   Years of education: Not on file   Highest education level: High school graduate  Occupational History   Occupation: Unemployed  Tobacco Use   Smoking status: Never   Smokeless tobacco: Never  Vaping Use   Vaping status: Never Used  Substance and Sexual Activity   Alcohol use: Never   Drug use: Never   Sexual activity: Not Currently  Other Topics Concern   Not on file  Social History Narrative   Not on file   Social Drivers of Health   Financial Resource Strain: Low Risk  (02/27/2023)   Overall Financial Resource Strain (CARDIA)    Difficulty of Paying Living Expenses: Not hard at all  Food Insecurity: No Food Insecurity (02/27/2023)   Hunger Vital Sign    Worried About Running Out of Food in the Last Year: Never true    Ran Out of Food in the Last Year: Never true  Transportation Needs: No Transportation Needs (02/27/2023)   PRAPARE - Administrator, Civil Service (Medical): No    Lack of Transportation (Non-Medical): No  Physical Activity: Inactive (02/27/2023)   Exercise Vital Sign    Days of Exercise per Week: 0 days    Minutes of Exercise per Session: 0 min  Stress: Stress Concern Present (02/27/2023)   [employer] of Occupational Health  - Occupational Stress Questionnaire    Feeling of Stress : To some extent  Social Connections: Moderately Isolated (02/27/2023)   Social Connection and Isolation Panel [NHANES]    Frequency of Communication with Friends and Family: More than three times a week    Frequency of Social Gatherings with Friends and Family: Twice a week    Attends Religious Services: 1 to 4 times per year    Active Member of [employer] or Organizations: No    Attends Banker Meetings: Never    Marital Status:  Divorced    Tobacco Counseling Counseling given: Not Answered    Clinical Intake:  Pre-visit preparation completed: Yes  Pain : No/denies pain     Nutritional Risks: None Diabetes: No  How often do you need to have someone help you when you read instructions, pamphlets, or other written materials from your doctor or pharmacy?: 1 - Never  Interpreter Needed?: No  Information entered by :: NAllen LPN   Activities of Daily Living     02/27/2023    8:08 AM  In your present state of health, do you have any difficulty performing the following activities:  Hearing? 0  Vision? 0  Difficulty concentrating or making decisions? 1  Walking or climbing stairs? 0  Dressing or bathing? 0  Doing errands, shopping? 0  Preparing Food and eating ? N  Using the Toilet? N  In the past six months, have you accidently leaked urine? N  Do you have problems with loss of bowel control? N  Managing your Medications? N  Managing your Finances? N  Housekeeping or managing your Housekeeping? N    Patient Care Team: Loyola Mast, MD as PCP - General (Family Medicine) Jens Som Madolyn Frieze, MD as Consulting Physician (Cardiology) Freddie Breech, DPM as Consulting Physician (Podiatry)  Indicate any recent Medical Services you may have received from other than [facility] providers in the past year (date may be approximate).     Assessment:   This is a routine wellness examination for  Sienna.  Hearing/Vision screen Hearing Screening - Comments:: Denies hearing issues Vision Screening - Comments:: No regular eye exams   Goals Addressed             This Visit's Progress    Patient Stated       02/27/2023, wants to lose weight       Depression Screen     02/27/2023    8:16 AM 01/12/2023    9:23 AM 10/11/2022    9:26 AM 08/04/2022    9:26 AM 03/21/2022   10:36 AM 02/25/2022    8:18 AM 12/20/2021    2:14 PM  PHQ 2/9 Scores  PHQ - 2 Score 0 0 0 1 4 0 3  PHQ- 9 Score 0 0  9 17  13     Fall Risk     02/27/2023    8:15 AM 01/12/2023    9:23 AM 10/11/2022    9:26 AM 08/04/2022    9:26 AM 02/25/2022    8:17 AM  Fall Risk   Falls in the past year? 0 0 0 0 1  Comment     was getting lightheaded  Number falls in past yr: 0 0 0 0 1  Injury with Fall? 0 0 0 0 1  Comment     headaches since fall  Risk for fall due to : Medication side effect No Fall Risks No Fall Risks No Fall Risks Medication side effect  Follow up Falls prevention discussed;Falls evaluation completed Falls evaluation completed Falls evaluation completed Falls evaluation completed Falls prevention discussed;Education provided;Falls evaluation completed    MEDICARE RISK AT HOME:  Medicare Risk at Home Any stairs in or around the home?: Yes If so, are there any without handrails?: No Home free of loose throw rugs in walkways, pet beds, electrical cords, etc?: Yes Adequate lighting in your home to reduce risk of falls?: Yes Life alert?: No Use of a cane, walker or w/c?: No Grab bars in the bathroom?: No Shower chair or  bench in shower?: No Elevated toilet seat or a handicapped toilet?: Yes  TIMED UP AND GO:  Was the test performed?  No  Cognitive Function: 6CIT completed        02/27/2023    8:17 AM 02/25/2022    8:20 AM  6CIT Screen  What Year? 0 points 0 points  What month? 0 points 0 points  What time? 0 points 0 points  Count back from 20 0 points 0 points  Months in reverse 4 points 4  points  Repeat phrase 0 points 6 points  Total Score 4 points 10 points    Immunizations Immunization History  Administered Date(s) Administered   Influenza,inj,Quad PF,6+ Mos 10/13/2020, 09/27/2021   PFIZER(Purple Top)SARS-COV-2 Vaccination 03/04/2019, 04/02/2019   PNEUMOCOCCAL CONJUGATE-20 01/19/2021   Tdap 01/19/2021   Zoster Recombinant(Shingrix) 10/11/2022, 01/12/2023    Screening Tests Health Maintenance  Topic Date Due   HIV Screening  Never done   Hepatitis C Screening  Never done   COVID-19 Vaccine (3 - Pfizer risk series) 04/30/2019   INFLUENZA VACCINE  04/03/2023 (Originally 08/04/2022)   Fecal DNA (Cologuard)  02/16/2024   Medicare Annual Wellness (AWV)  02/27/2024   MAMMOGRAM  07/17/2024   Cervical Cancer Screening (HPV/Pap Cotest)  04/28/2026   DTaP/Tdap/Td (2 - Td or Tdap) 01/20/2031   Pneumococcal Vaccine 32-48 Years old  Completed   Zoster Vaccines- Shingrix  Completed   HPV VACCINES  Aged Out    Health Maintenance  Health Maintenance Due  Topic Date Due   HIV Screening  Never done   Hepatitis C Screening  Never done   COVID-19 Vaccine (3 - Pfizer risk series) 04/30/2019   Health Maintenance Items Addressed: Hep c  and flu vaccine due  Additional Screening:  Vision Screening: Recommended annual ophthalmology exams for early detection of glaucoma and other disorders of the eye.  Dental Screening: Recommended annual dental exams for proper oral hygiene  Community Resource Referral / Chronic Care Management: CRR required this visit?  No   CCM required this visit?  No     Plan:     I have personally reviewed and noted the following in the patient's chart:   Medical and social history Use of alcohol, tobacco or illicit drugs  Current medications and supplements including opioid prescriptions. Patient is not currently taking opioid prescriptions. Functional ability and status Nutritional status Physical activity Advanced directives List of  other physicians Hospitalizations, surgeries, and ER visits in previous 12 months Vitals Screenings to include cognitive, depression, and falls Referrals and appointments  In addition, I have reviewed and discussed with patient certain preventive protocols, quality metrics, and best practice recommendations. A written personalized care plan for preventive services as well as general preventive health recommendations were provided to patient.     Barb Merino, LPN   9/56/2130   After Visit Summary: (Pick Up) Due to this being a telephonic visit, with patients personalized plan was offered to patient and patient has requested to Pick up at office.  Notes: Nothing significant to report at this time.

## 2023-03-14 ENCOUNTER — Telehealth: Payer: Self-pay | Admitting: Family Medicine

## 2023-03-14 NOTE — Telephone Encounter (Signed)
 Is she able to get this?

## 2023-03-14 NOTE — Telephone Encounter (Signed)
 Copied from CRM [id number]. Topic: Appointments - Scheduling Inquiry for Clinic >> Mar 14, 2023 11:19 AM Katherine Parker wrote: Reason for CRM:   Patient has known exposure to TB and needs TB test scheduled.   CB#  [phone]

## 2023-03-15 NOTE — Telephone Encounter (Signed)
 Spoke to patient and her daughter had a positive TB Gold test.  Advised that she would need to have an appointment with provider.   She has an upcoming appt on 04/12/23. Dm/cma

## 2023-04-12 ENCOUNTER — Encounter: Payer: Self-pay | Admitting: Family Medicine

## 2023-04-12 ENCOUNTER — Ambulatory Visit (INDEPENDENT_AMBULATORY_CARE_PROVIDER_SITE_OTHER): Payer: Medicare Other | Admitting: Family Medicine

## 2023-04-12 VITALS — BP 120/70 | HR 48 | Temp 98.0°F | Ht 66.0 in | Wt 298.2 lb

## 2023-04-12 DIAGNOSIS — J309 Allergic rhinitis, unspecified: Secondary | ICD-10-CM | POA: Insufficient documentation

## 2023-04-12 DIAGNOSIS — Z6841 Body Mass Index (BMI) 40.0 and over, adult: Secondary | ICD-10-CM

## 2023-04-12 DIAGNOSIS — I10 Essential (primary) hypertension: Secondary | ICD-10-CM

## 2023-04-12 DIAGNOSIS — L304 Erythema intertrigo: Secondary | ICD-10-CM | POA: Diagnosis not present

## 2023-04-12 DIAGNOSIS — F418 Other specified anxiety disorders: Secondary | ICD-10-CM

## 2023-04-12 DIAGNOSIS — J301 Allergic rhinitis due to pollen: Secondary | ICD-10-CM | POA: Diagnosis not present

## 2023-04-12 MED ORDER — FLUTICASONE PROPIONATE 50 MCG/ACT NA SUSP
2.0000 | Freq: Every day | NASAL | 6 refills | Status: AC
Start: 2023-04-12 — End: ?

## 2023-04-12 MED ORDER — CLOTRIMAZOLE-BETAMETHASONE 1-0.05 % EX CREA: 1.0000 | TOPICAL_CREAM | Freq: Every day | CUTANEOUS | 0 refills | Status: AC

## 2023-04-12 MED ORDER — BUSPIRONE HCL 10 MG PO TABS: 10.0000 mg | ORAL_TABLET | Freq: Two times a day (BID) | ORAL | 3 refills | Status: DC

## 2023-04-12 NOTE — Patient Instructions (Signed)
 Use olopatadine (Pataday) 1 drop each eye daily for allergy eye symptoms.

## 2023-04-12 NOTE — Progress Notes (Signed)
 [facility] [address] [place] [address] Dept: [phone] Dept Fax: [phone]  Chronic Care Office Visit  Subjective:    Patient ID: Katherine Parker, female    DOB: 1965/02/27, 58 y.o..   MRN: [id number]  Chief Complaint  Patient presents with   Hypertension    3 month f/u HTN.  C/o hair thinning skin issues (belly), and memory.    History of Present Illness:  Patient is in today for reassessment of chronic medical issues.  [name] has a history of hypertension. She has been managed on metoprolol 50 mg 1/2 tab daily for this and for a history of SVT. She had been noted to have some hypertrophic cardiomyopathy with LVH on an echocardiogram in 2022. She is also on amlodipine 5 mg daily. She is not having any current dyspnea or lower leg edema.   [name] has a history of depression. She is managed on venlafaxine XR 225 mg (150 mg + 75 mg) daily and buspirone 5 mg twice a day as needed. [name] is caring for her sister. The stress of this does have some impact on her anxiety. She asks about increasing her medicine dose.   [name] notes she has had increased allergy symptoms. This includes nasal congestion with rhinorrhea. She has also noted her eyes are puffy and itching. She does take a daily cetirizine.  [name]. complains of an area of thinning of the skin within her abdominal folds. This is irritated and burning at times.  [name] has a history of obesity. She continues to lose weight, which she attributes to periods of fasting and prayer.  Past Medical History: Patient Active Problem List   Diagnosis Date Noted   Allergic rhinitis 04/12/2023   Onychomycosis 01/12/2023   Chronic post-traumatic headache, not intractable 08/04/2022   Fibroadenoma of breast determined by biopsy 08/04/2022   Gastroesophageal reflux disease without esophagitis 05/18/2022   Chronic constipation 12/08/2021   History of bulimia  nervosa 12/08/2021   Vitiligo 07/27/2021   Obstructive sleep apnea 03/24/2021   Depression with anxiety 03/24/2021   Stutter 03/24/2021   Paroxysmal SVT (supraventricular tachycardia) (HCC) 11/18/2020   Spondylolisthesis at L5-S1 level 10/20/2020   Prediabetes 10/20/2020   Hypertrophic cardiomyopathy (HCC) 10/13/2020   Morbid obesity with BMI of 45.0-49.9, adult (HCC) 10/13/2020   Essential hypertension 10/13/2020   Chronic right-sided low back pain with right-sided sciatica 10/13/2020   Borderline hyperlipidemia 03/09/2020   Insomnia 03/09/2020   Mild intermittent asthma 03/09/2020   Moderate recurrent major depression (HCC) 03/09/2020   Past Surgical History:  Procedure Laterality Date   BREAST BIOPSY Right 08/01/2022   [place] RT BREAST BX W LOC DEV 1ST LESION IMG BX SPEC US GUIDE 08/01/2022 [facility]   CESAREAN SECTION     GUM SURGERY     TUBAL LIGATION     Family History  Problem Relation Age of Onset   Heart disease Mother    COPD Mother    Congestive Heart Failure Mother    Hypertension Mother    Diabetes Mother    Dementia Father    Breast cancer Sister    Colitis Sister    Diabetes Maternal Grandmother    Colon cancer Maternal Grandmother    Dementia Paternal Grandmother    Dementia Paternal Grandfather    Crohn's disease Daughter    Cancer Half-Sister        Breast, brain metastasis   Cancer Half-Sister  Uterine   Colon cancer Other        mat great gm   Esophageal cancer Maternal Aunt    Outpatient Medications Prior to Visit  Medication Sig Dispense Refill   albuterol (PROVENTIL) (2.5 MG/3ML) 0.083% nebulizer solution Take 3 mLs (2.5 mg total) by nebulization every 6 (six) hours as needed for wheezing or shortness of breath. 75 mL 12   albuterol (VENTOLIN HFA) 108 (90 Base) MCG/ACT inhaler INHALE 1-2 PUFFS BY MOUTH EVERY 6 HOURS AS NEEDED FOR WHEEZE OR SHORTNESS OF BREATH (Patient taking differently: Inhale 1-2 puffs into the lungs every 6 (six)  hours as needed for wheezing or shortness of breath.) 18 each 1   amLODipine (NORVASC) 5 MG tablet Take 1 tablet (5 mg total) by mouth daily. 90 tablet 3   cetirizine (ZYRTEC) 10 MG tablet Take 1 tablet (10 mg total) by mouth daily as needed for allergies or rhinitis. 90 tablet 3   ibuprofen (ADVIL) 200 MG tablet Take 400 mg by mouth every 6 (six) hours as needed for mild pain.     metoprolol succinate (TOPROL XL) 50 MG 24 hr tablet Take 0.5 tablets (25 mg total) by mouth daily. 90 tablet 3   omeprazole (PRILOSEC) 40 MG capsule Take 1 capsule (40 mg total) by mouth daily. 90 capsule 3   prochlorperazine (COMPAZINE) 5 MG tablet Take 1 tablet (5 mg total) by mouth every 6 (six) hours as needed for nausea or vomiting. 30 tablet 0   terbinafine (LAMISIL) 250 MG tablet Take 1 tablet (250 mg total) by mouth daily. 90 tablet 0   venlafaxine XR (EFFEXOR XR) 75 MG 24 hr capsule Take 1 capsule (75 mg total) by mouth daily with breakfast. Take togther with 150 mg dose (total dose= 225 mg) 90 capsule 3   venlafaxine XR (EFFEXOR-XR) 150 MG 24 hr capsule Take 1 capsule (150 mg total) by mouth daily with breakfast. Take together with 75 mg dose (total dose= 225 mg) 90 capsule 3   busPIRone (BUSPAR) 5 MG tablet Take 1 tablet (5 mg total) by mouth 2 (two) times daily. 180 tablet 3   No facility-administered medications prior to visit.   Allergies  Allergen Reactions   Penicillins Other (See Comments)    Patient is unsure if she is still allergic to this in 2024- from childhood    Objective:   Today's Vitals   04/12/23 0907  BP: 120/70  Pulse: (!) 48  Temp: 98 F (36.7 C)  TempSrc: Temporal  SpO2: 100%  Weight: 298 lb 3.2 oz (135.3 kg)  Height: 5\' 6"  (1.676 m)   Body mass index is 48.13 kg/m.   General: Well developed, well nourished. No acute distress. Abdomen: There is an area of linear maceration of the skin within the abdominal fold Back: Straight. No CVA tenderness bilaterally. Extremities:  No edema noted. Psych: Alert and oriented. Normal mood and affect.  Health Maintenance Due  Topic Date Due   HIV Screening  Never done   Hepatitis C Screening  Never done     Assessment & Plan:   Problem List Items Addressed This Visit       Cardiovascular and Mediastinum   Essential hypertension - Primary   BP is at goal. Continue metoprolol succinate 50 mg 1/2 tab daily and amlodipine 5 mg daily.        Respiratory   Allergic rhinitis   Discussed approaches to reducing pollen exposure. Continue cetirizine 10 mg daily. I will add a  daily nasal steroid spray. I recommend she use olopatadine eye drops to manage her allergy eye symptoms.      Relevant Medications   fluticasone (FLONASE) 50 MCG/ACT nasal spray     Other   Depression with anxiety   Relevant Medications   busPIRone (BUSPAR) 10 MG tablet   Morbid obesity with BMI of 45.0-49.9, adult (HCC)   Maximum weight: 380 lbs (02/2022) Current weight: 298 lbs Weight change since last visit: - 26 lbs Total weight loss: -82 lbs (21.6 %)  I encourage Ms. Brackin to continue her efforts. She attributes much of this to fasting at times, which she does more for religious reasons. She is pleased with her progress and is noting improved energy and less dyspnea.      Other Visit Diagnoses       Intertrigo       I will prescribe Lotrisone cream to use bid until resolved.   Relevant Medications   clotrimazole-betamethasone (LOTRISONE) cream       Return in about 3 months (around 07/12/2023) for Reassessment.   Loyola Mast, MD

## 2023-04-12 NOTE — Assessment & Plan Note (Signed)
 Maximum weight: 380 lbs (02/2022) Current weight: 298 lbs Weight change since last visit: - 26 lbs Total weight loss: -82 lbs (21.6 %)  I encourage Katherine Parker to continue her efforts. She attributes much of this to fasting at times, which she does more for religious reasons. She is pleased with her progress and is noting improved energy and less dyspnea.

## 2023-04-12 NOTE — Assessment & Plan Note (Signed)
BP is at goal. Continue metoprolol succinate 50 mg 1/2 tab daily and amlodipine 5 mg daily.

## 2023-04-12 NOTE — Assessment & Plan Note (Signed)
 Discussed approaches to reducing pollen exposure. Continue cetirizine 10 mg daily. I will add a daily nasal steroid spray. I recommend she use olopatadine eye drops to manage her allergy eye symptoms.

## 2023-04-13 ENCOUNTER — Telehealth: Payer: Self-pay | Admitting: Family Medicine

## 2023-04-13 NOTE — Telephone Encounter (Signed)
 Copied from CRM [id number]. Topic: Clinical - Prescription Issue >> Apr 13, 2023 12:08 PM Pascal Lux wrote: Reason for CRM: Patient called stated that she saw Dr. Veto Kemps yesterday and he was supposed to send a prescription for pataday to the pharmacy but she did not receive it yet.

## 2023-04-13 NOTE — Telephone Encounter (Signed)
 Spoke to Pt and inform her that eye drops can be purchased OTC at [facility], [facility], [facility] etc. Pt verbalized understanding.

## 2023-07-12 ENCOUNTER — Ambulatory Visit (INDEPENDENT_AMBULATORY_CARE_PROVIDER_SITE_OTHER): Admitting: Family Medicine

## 2023-07-12 ENCOUNTER — Encounter: Payer: Self-pay | Admitting: Family Medicine

## 2023-07-12 ENCOUNTER — Ambulatory Visit: Payer: Self-pay | Admitting: Family Medicine

## 2023-07-12 VITALS — Temp 97.6°F | Ht 66.0 in | Wt 267.0 lb

## 2023-07-12 DIAGNOSIS — R7303 Prediabetes: Secondary | ICD-10-CM

## 2023-07-12 DIAGNOSIS — I10 Essential (primary) hypertension: Secondary | ICD-10-CM

## 2023-07-12 DIAGNOSIS — Z6841 Body Mass Index (BMI) 40.0 and over, adult: Secondary | ICD-10-CM

## 2023-07-12 DIAGNOSIS — K117 Disturbances of salivary secretion: Secondary | ICD-10-CM | POA: Diagnosis not present

## 2023-07-12 DIAGNOSIS — E785 Hyperlipidemia, unspecified: Secondary | ICD-10-CM | POA: Diagnosis not present

## 2023-07-12 LAB — LIPID PANEL
Cholesterol: 231 mg/dL — ABNORMAL HIGH (ref 0–200)
HDL: 56.9 mg/dL (ref >39.00)
LDL Cholesterol: 160 mg/dL — ABNORMAL HIGH (ref 0–99)
NonHDL: 174.11
Total CHOL/HDL Ratio: 4
Triglycerides: 69 mg/dL (ref 0.0–149.0)
VLDL: 13.8 mg/dL (ref 0.0–40.0)

## 2023-07-12 LAB — COMPREHENSIVE METABOLIC PANEL WITH GFR
ALT: 5 U/L (ref 0–35)
AST: 23 U/L (ref 0–37)
Albumin: 3.8 g/dL (ref 3.5–5.2)
Alkaline Phosphatase: 61 U/L (ref 39–117)
BUN: 22 mg/dL (ref 6–23)
CO2: 34 meq/L — ABNORMAL HIGH (ref 19–32)
Calcium: 8.9 mg/dL (ref 8.4–10.5)
Chloride: 103 meq/L (ref 96–112)
Creatinine, Ser: 0.85 mg/dL (ref 0.40–1.20)
GFR: 74.87 mL/min (ref >60.00)
Glucose, Bld: 98 mg/dL (ref 70–99)
Potassium: 3.8 meq/L (ref 3.5–5.1)
Sodium: 142 meq/L (ref 135–145)
Total Bilirubin: 0.3 mg/dL (ref 0.2–1.2)
Total Protein: 6.6 g/dL (ref 6.0–8.3)

## 2023-07-12 LAB — HEMOGLOBIN A1C: Hgb A1c MFr Bld: 5.6 % (ref 4.6–6.5)

## 2023-07-12 MED ORDER — ONDANSETRON 4 MG PO TBDP: 4.0000 mg | ORAL_TABLET | Freq: Three times a day (TID) | ORAL | 0 refills | Status: AC | PRN

## 2023-07-12 NOTE — Assessment & Plan Note (Addendum)
 Maximum weight: 380 lbs (02/2022) Current weight: 267 lbs Weight change since last visit: - 22 lbs Total weight loss: - 113 lbs (29.7 %)  I encourage Katherine Parker to continue her efforts. She attributes much of this to fasting at times, which she does more for religious reasons. She is pleased with her progress and is noting improved energy and less dyspnea. Looking at her pattern of adiposity currently, I suspect she has a degree of lipedema. Weight loss is an appropriate approach to manage this.

## 2023-07-12 NOTE — Progress Notes (Signed)
 [facility] [address] [place] [address] Dept: [phone] Dept Fax: [phone]  Chronic Care Office Visit  Subjective:    Patient ID: Vera Furniss, female    DOB: 07/24/1965, 58 y.o..   MRN: [id number]  Chief Complaint  Patient presents with   Hypertension    3 month f/u HTN.  C/o drooling all the time x 1 week.    History of Present Illness:  Patient is in today for reassessment of chronic medical issues.  Ms. Rex has a history of hypertension. She has been managed on metoprolol  50 mg 1/2 tab daily for this and for a history of SVT. She had been noted to have some hypertrophic cardiomyopathy with LVH on an echocardiogram in 2022. She is also on amlodipine  5 mg daily. She is not having any current dyspnea, chest pain or lower leg edema.   Ms. Ingram has a history of depression. She is managed on venlafaxine  XR 225 mg (150 mg + 75 mg) daily and buspirone  10 mg twice a day as needed. Ms. Miskell is caring for her sister. She feels her mood is doing better.   Ms. Cavness has a history of obesity. She continues to lose weight, which she attributes to periods of fasting and prayer. She notes she just completed a 2 week fast.  Ms. Bellevue notes in the past week, she has suddenly developed an issue with excessive saliva. She is having to carry a tissue with her to spit every minute or so. She denies any nausea or abdominal pain.  Past Medical History: Patient Active Problem List   Diagnosis Date Noted   Sialorrhea 07/12/2023   Allergic rhinitis 04/12/2023   Onychomycosis 01/12/2023   Chronic post-traumatic headache, not intractable 08/04/2022   Fibroadenoma of breast determined by biopsy 08/04/2022   Gastroesophageal reflux disease without esophagitis 05/18/2022   Chronic constipation 12/08/2021   History of bulimia nervosa 12/08/2021   Vitiligo 07/27/2021   Obstructive sleep apnea 03/24/2021   Depression with anxiety  03/24/2021   Stutter 03/24/2021   Paroxysmal SVT (supraventricular tachycardia) (HCC) 11/18/2020   Spondylolisthesis at L5-S1 level 10/20/2020   Prediabetes 10/20/2020   Hypertrophic cardiomyopathy (HCC) 10/13/2020   Morbid obesity with BMI of 40.0-44.9, adult (HCC) 10/13/2020   Essential hypertension 10/13/2020   Chronic right-sided low back pain with right-sided sciatica 10/13/2020   Borderline hyperlipidemia 03/09/2020   Insomnia 03/09/2020   Mild intermittent asthma 03/09/2020   Moderate recurrent major depression (HCC) 03/09/2020   Past Surgical History:  Procedure Laterality Date   BREAST BIOPSY Right 08/01/2022   US  RT BREAST BX W LOC DEV 1ST LESION IMG BX SPEC US  GUIDE 08/01/2022 [facility] MAMMOGRAPHY   CESAREAN SECTION     GUM SURGERY     TUBAL LIGATION     Family History  Problem Relation Age of Onset   Heart disease Mother    COPD Mother    Congestive Heart Failure Mother    Hypertension Mother    Diabetes Mother    Dementia Father    Breast cancer Sister    Colitis Sister    Diabetes Maternal Grandmother    Colon cancer Maternal Grandmother    Dementia Paternal Grandmother    Dementia Paternal Grandfather    Crohn's disease Daughter    Cancer Half-Sister        Breast, brain metastasis   Cancer Half-Sister        Uterine   Colon cancer Other  mat great gm   Esophageal cancer Maternal Aunt    Outpatient Medications Prior to Visit  Medication Sig Dispense Refill   albuterol  (PROVENTIL ) (2.5 MG/3ML) 0.083% nebulizer solution Take 3 mLs (2.5 mg total) by nebulization every 6 (six) hours as needed for wheezing or shortness of breath. 75 mL 12   albuterol  (VENTOLIN  HFA) 108 (90 Base) MCG/ACT inhaler INHALE 1-2 PUFFS BY MOUTH EVERY 6 HOURS AS NEEDED FOR WHEEZE OR SHORTNESS OF BREATH (Patient taking differently: Inhale 1-2 puffs into the lungs every 6 (six) hours as needed for wheezing or shortness of breath.) 18 each 1   amLODipine  (NORVASC ) 5 MG tablet Take 1  tablet (5 mg total) by mouth daily. 90 tablet 3   busPIRone  (BUSPAR ) 10 MG tablet Take 1 tablet (10 mg total) by mouth 2 (two) times daily. 180 tablet 3   cetirizine  (ZYRTEC ) 10 MG tablet Take 1 tablet (10 mg total) by mouth daily as needed for allergies or rhinitis. 90 tablet 3   clotrimazole -betamethasone  (LOTRISONE ) cream Apply 1 Application topically daily. 30 g 0   fluticasone  (FLONASE ) 50 MCG/ACT nasal spray Place 2 sprays into both nostrils daily. 16 g 6   ibuprofen (ADVIL) 200 MG tablet Take 400 mg by mouth every 6 (six) hours as needed for mild pain.     metoprolol  succinate (TOPROL  XL) 50 MG 24 hr tablet Take 0.5 tablets (25 mg total) by mouth daily. 90 tablet 3   omeprazole  (PRILOSEC) 40 MG capsule Take 1 capsule (40 mg total) by mouth daily. 90 capsule 3   prochlorperazine  (COMPAZINE ) 5 MG tablet Take 1 tablet (5 mg total) by mouth every 6 (six) hours as needed for nausea or vomiting. 30 tablet 0   terbinafine  (LAMISIL ) 250 MG tablet Take 1 tablet (250 mg total) by mouth daily. 90 tablet 0   venlafaxine  XR (EFFEXOR  XR) 75 MG 24 hr capsule Take 1 capsule (75 mg total) by mouth daily with breakfast. Take togther with 150 mg dose (total dose= 225 mg) 90 capsule 3   venlafaxine  XR (EFFEXOR -XR) 150 MG 24 hr capsule Take 1 capsule (150 mg total) by mouth daily with breakfast. Take together with 75 mg dose (total dose= 225 mg) 90 capsule 3   No facility-administered medications prior to visit.   Allergies  Allergen Reactions   Penicillins Other (See Comments)    Patient is unsure if she is still allergic to this in 2024- from childhood    Objective:   Today's Vitals   07/12/23 0847  Temp: 97.6 F (36.4 C)  TempSrc: Temporal  Weight: 267 lb (121.1 kg)  Height: 5' 6 (1.676 m)   Body mass index is 43.09 kg/m.   General: Well developed, well nourished. General shape has been slimming of the upper body with considerable weight   around her hips and legs. No acute distress. CV:  RRR without murmurs or rubs. Pulses 2+ bilaterally. Abdomen: Soft, non-tender. No rebound or guarding. Extremities: No edema noted. Skin: Warm and dry. No rashes. Neuro: CN II-XII intact. Normal sensation and DTR bilaterally. Psych: Alert and oriented. Normal mood and affect.  Health Maintenance Due  Topic Date Due   HIV Screening  Never done   Hepatitis C Screening  Never done   Hepatitis B Vaccines (1 of 3 - 19+ 3-dose series) Never done     Assessment & Plan:   Problem List Items Addressed This Visit       Cardiovascular and Mediastinum   Essential hypertension -  Primary   BP is at goal. Continue metoprolol  succinate 50 mg 1/2 tab daily and amlodipine  5 mg daily.      Relevant Orders   Comprehensive metabolic panel with GFR     Digestive   Sialorrhea   Etiology is unclear. I wonder if this may relate to her periods of fasting. I asked her to resume her cetirizine , as this may help. I will provide some Zofran , as this could be a variant of nausea. If not improving, I would consider glycopyrrolate.       Relevant Medications   ondansetron  (ZOFRAN -ODT) 4 MG disintegrating tablet     Other   Borderline hyperlipidemia   I will check lipids to assess the current status of this.      Relevant Orders   Lipid panel   Morbid obesity with BMI of 40.0-44.9, adult (HCC)   Maximum weight: 380 lbs (02/2022) Current weight: 267 lbs Weight change since last visit: - 22 lbs Total weight loss: - 113 lbs (29.7 %)  I encourage Ms. Shuck to continue her efforts. She attributes much of this to fasting at times, which she does more for religious reasons. She is pleased with her progress and is noting improved energy and less dyspnea. Looking at her pattern of adiposity currently, I suspect she has a degree of lipedema. Weight loss is an appropriate approach to manage this.      Prediabetes   Relevant Orders   Comprehensive metabolic panel with GFR   Hemoglobin A1c    Return in about  3 months (around 10/12/2023) for Reassessment.   Garnette CHRISTELLA Simpler, MD

## 2023-07-12 NOTE — Assessment & Plan Note (Signed)
I will check lipids to assess the current status of this.

## 2023-07-12 NOTE — Assessment & Plan Note (Signed)
BP is at goal. Continue metoprolol succinate 50 mg 1/2 tab daily and amlodipine 5 mg daily.

## 2023-07-12 NOTE — Assessment & Plan Note (Signed)
 Etiology is unclear. I wonder if this may relate to her periods of fasting. I asked her to resume her cetirizine , as this may help. I will provide some Zofran , as this could be a variant of nausea. If not improving, I would consider glycopyrrolate.

## 2023-07-21 ENCOUNTER — Ambulatory Visit: Payer: Self-pay

## 2023-07-21 NOTE — Telephone Encounter (Signed)
 FYI Only or Action Required?: FYI only for provider.  Patient was last seen in primary care on 07/12/2023 by Katherine Garnette HERO, MD.  Called Nurse Triage reporting Leg Swelling.  Symptoms began several days ago.  Interventions attempted: Nothing.  Symptoms are: stable.  Triage Disposition: See Physician Within 24 Hours  Patient/caregiver understands and will follow disposition?: Yes  No appts today, scheduled appt for 07/24/23 with PCP. Advised if swelling/pain gets worse or SOB occurs to go to UC/ED. Pt verbalized understanding.   Copied from CRM [id number]. Topic: Clinical - Red Word Triage >> Jul 21, 2023  1:20 PM Rosina BIRCH wrote: Reason for CRM: legs are swollen for four days and she stated she has been working out but she want to make sure it is not lymphedema Reason for Disposition  [1] MODERATE leg swelling (e.g., swelling extends up to knees) AND [2] new-onset or getting worse  Answer Assessment - Initial Assessment Questions 1. ONSET: When did the swelling start? (e.g., minutes, hours, days)     4 days  2. LOCATION: What part of the leg is swollen?  Are both legs swollen or just one leg?     Both legs ankle up to thigh  3. SEVERITY: How bad is the swelling? (e.g., localized; mild, moderate, severe)     Moderate  4. REDNESS: Is there redness or signs of infection?     Slight redness 5. PAIN: Is the swelling painful to touch? If Yes, ask: How painful is it?   (Scale 1-10; mild, moderate or severe)     Moderate  6. FEVER: Do you have a fever? If Yes, ask: What is it, how was it measured, and when did it start?      No 10. OTHER SYMPTOMS: Do you have any other symptoms? (e.g., chest pain, difficulty breathing)       Tender and soreness to touch  Protocols used: Leg Swelling and Edema-A-AH

## 2023-07-24 ENCOUNTER — Encounter: Payer: Self-pay | Admitting: Family Medicine

## 2023-07-24 ENCOUNTER — Ambulatory Visit: Payer: Self-pay | Admitting: Family Medicine

## 2023-07-24 ENCOUNTER — Ambulatory Visit (INDEPENDENT_AMBULATORY_CARE_PROVIDER_SITE_OTHER): Admitting: Family Medicine

## 2023-07-24 VITALS — BP 132/84 | HR 70 | Temp 96.7°F | Ht 66.0 in | Wt 270.2 lb

## 2023-07-24 DIAGNOSIS — R600 Localized edema: Secondary | ICD-10-CM | POA: Insufficient documentation

## 2023-07-24 LAB — BRAIN NATRIURETIC PEPTIDE: Pro B Natriuretic peptide (BNP): 56 pg/mL (ref 0.0–100.0)

## 2023-07-24 LAB — BASIC METABOLIC PANEL WITH GFR
BUN: 12 mg/dL (ref 6–23)
CO2: 28 meq/L (ref 19–32)
Calcium: 9.3 mg/dL (ref 8.4–10.5)
Chloride: 105 meq/L (ref 96–112)
Creatinine, Ser: 0.65 mg/dL (ref 0.40–1.20)
GFR: 96.19 mL/min (ref >60.00)
Glucose, Bld: 91 mg/dL (ref 70–99)
Potassium: 3.8 meq/L (ref 3.5–5.1)
Sodium: 140 meq/L (ref 135–145)

## 2023-07-24 MED ORDER — FUROSEMIDE 20 MG PO TABS: 20.0000 mg | ORAL_TABLET | Freq: Every day | ORAL | 1 refills | Status: DC | PRN

## 2023-07-24 MED ORDER — POTASSIUM CHLORIDE CRYS ER 20 MEQ PO TBCR: 20.0000 meq | EXTENDED_RELEASE_TABLET | Freq: Every day | ORAL | 1 refills | Status: DC

## 2023-07-24 NOTE — Assessment & Plan Note (Signed)
 Discussed diagnosis of lipedema. Recommend ongoing efforts at weight loss.

## 2023-07-24 NOTE — Patient Instructions (Signed)
 Lipedema Foundation ([email or web address]://www.[email or web address].org)

## 2023-07-24 NOTE — Assessment & Plan Note (Signed)
 Suspect this is due to the hot, humid weather superimposed on her chronic lipedema. I will check a BNP to rule-out HF in light of her hypertensive cardiomyopathy. However, she has no other clinical signs of HF. I will prescribe furosemide  to help relieve her edema. I will provide potassium to help prevent hypokalemia.

## 2023-07-24 NOTE — Progress Notes (Signed)
 [facility] [address] [place] [address] Dept: [phone] Dept Fax: [phone]  Office Visit  Subjective:    Patient ID: Katherine Parker, female    DOB: 1965-01-31, 58 y.o..   MRN: [id number]  Chief Complaint  Patient presents with   Leg Swelling    Bilateral leg swelling with pain for 1-2 weeks   History of Present Illness:  Patient is in today complaining of a 10-day history of swelling in her lower legs and some diffuse pain in both lower extremities. She denies any shortness of breath, orthopnea or PND. Katherine Parker has a history of hypertrophic cardiomyopathy and hypertension.  Past Medical History: Patient Active Problem List   Diagnosis Date Noted   Lipedema of lower extremity 07/24/2023   Sialorrhea 07/12/2023   Allergic rhinitis 04/12/2023   Onychomycosis 01/12/2023   Chronic post-traumatic headache, not intractable 08/04/2022   Fibroadenoma of breast determined by biopsy 08/04/2022   Gastroesophageal reflux disease without esophagitis 05/18/2022   Chronic constipation 12/08/2021   History of bulimia nervosa 12/08/2021   Vitiligo 07/27/2021   Obstructive sleep apnea 03/24/2021   Depression with anxiety 03/24/2021   Stutter 03/24/2021   Paroxysmal SVT (supraventricular tachycardia) (HCC) 11/18/2020   Spondylolisthesis at L5-S1 level 10/20/2020   Prediabetes 10/20/2020   Hypertrophic cardiomyopathy (HCC) 10/13/2020   Morbid obesity with BMI of 40.0-44.9, adult (HCC) 10/13/2020   Essential hypertension 10/13/2020   Chronic right-sided low back pain with right-sided sciatica 10/13/2020   Borderline hyperlipidemia 03/09/2020   Insomnia 03/09/2020   Mild intermittent asthma 03/09/2020   Moderate recurrent major depression (HCC) 03/09/2020   Past Surgical History:  Procedure Laterality Date   BREAST BIOPSY Right 08/01/2022   US  RT BREAST BX W LOC DEV 1ST LESION IMG BX SPEC US  GUIDE 08/01/2022 [facility]  MAMMOGRAPHY   CESAREAN SECTION     GUM SURGERY     TUBAL LIGATION     Family History  Problem Relation Age of Onset   Heart disease Mother    COPD Mother    Congestive Heart Failure Mother    Hypertension Mother    Diabetes Mother    Dementia Father    Breast cancer Sister    Colitis Sister    Diabetes Maternal Grandmother    Colon cancer Maternal Grandmother    Dementia Paternal Grandmother    Dementia Paternal Grandfather    Crohn's disease Daughter    Cancer Half-Sister        Breast, brain metastasis   Cancer Half-Sister        Uterine   Colon cancer Other        mat great gm   Esophageal cancer Maternal Aunt    Outpatient Medications Prior to Visit  Medication Sig Dispense Refill   albuterol  (PROVENTIL ) (2.5 MG/3ML) 0.083% nebulizer solution Take 3 mLs (2.5 mg total) by nebulization every 6 (six) hours as needed for wheezing or shortness of breath. 75 mL 12   albuterol  (VENTOLIN  HFA) 108 (90 Base) MCG/ACT inhaler INHALE 1-2 PUFFS BY MOUTH EVERY 6 HOURS AS NEEDED FOR WHEEZE OR SHORTNESS OF BREATH (Patient taking differently: Inhale 1-2 puffs into the lungs every 6 (six) hours as needed for wheezing or shortness of breath.) 18 each 1   amLODipine  (NORVASC ) 5 MG tablet Take 1 tablet (5 mg total) by mouth daily. 90 tablet 3   busPIRone  (BUSPAR ) 10 MG tablet Take 1 tablet (10 mg total) by mouth 2 (two) times daily. 180 tablet 3  cetirizine  (ZYRTEC ) 10 MG tablet Take 1 tablet (10 mg total) by mouth daily as needed for allergies or rhinitis. 90 tablet 3   clotrimazole -betamethasone  (LOTRISONE ) cream Apply 1 Application topically daily. 30 g 0   fluticasone  (FLONASE ) 50 MCG/ACT nasal spray Place 2 sprays into both nostrils daily. 16 g 6   ibuprofen (ADVIL) 200 MG tablet Take 400 mg by mouth every 6 (six) hours as needed for mild pain.     metoprolol  succinate (TOPROL  XL) 50 MG 24 hr tablet Take 0.5 tablets (25 mg total) by mouth daily. 90 tablet 3   omeprazole  (PRILOSEC) 40 MG  capsule Take 1 capsule (40 mg total) by mouth daily. 90 capsule 3   ondansetron  (ZOFRAN -ODT) 4 MG disintegrating tablet Take 1 tablet (4 mg total) by mouth every 8 (eight) hours as needed. 20 tablet 0   prochlorperazine  (COMPAZINE ) 5 MG tablet Take 1 tablet (5 mg total) by mouth every 6 (six) hours as needed for nausea or vomiting. 30 tablet 0   terbinafine  (LAMISIL ) 250 MG tablet Take 1 tablet (250 mg total) by mouth daily. 90 tablet 0   venlafaxine  XR (EFFEXOR  XR) 75 MG 24 hr capsule Take 1 capsule (75 mg total) by mouth daily with breakfast. Take togther with 150 mg dose (total dose= 225 mg) 90 capsule 3   venlafaxine  XR (EFFEXOR -XR) 150 MG 24 hr capsule Take 1 capsule (150 mg total) by mouth daily with breakfast. Take together with 75 mg dose (total dose= 225 mg) 90 capsule 3   No facility-administered medications prior to visit.   Allergies  Allergen Reactions   Penicillins Other (See Comments)    Patient is unsure if she is still allergic to this in 2024- from childhood      Objective:   Today's Vitals   07/24/23 0841  BP: 132/84  Pulse: 70  Temp: (!) 96.7 F (35.9 C)  SpO2: 99%  Weight: 270 lb 3.2 oz (122.6 kg)  Height: 5' 6 (1.676 m)  PainSc: 5   PainLoc: Leg   Body mass index is 43.61 kg/m.   General: Well developed, well nourished. No acute distress. Lungs: Clear to auscultation bilaterally. No wheezing, rales or rhonchi. CV: RRR without murmurs or rubs. Pulses 2+ bilaterally. Extremities: There is 1+ edema of both lower legs. Katherine Parker has disproportionate adiposity of the legs   bilaterally.  Psych: Alert and oriented. Normal mood and affect.  Health Maintenance Due  Topic Date Due   HIV Screening  Never done   Hepatitis C Screening  Never done   Hepatitis B Vaccines (1 of 3 - 19+ 3-dose series) Never done     Assessment & Plan:   Problem List Items Addressed This Visit       Musculoskeletal and Integument   Lipedema of lower extremity   Discussed  diagnosis of lipedema. Recommend ongoing efforts at weight loss.         Other   Lower leg edema - Primary   Suspect this is due to the hot, humid weather superimposed on her chronic lipedema. I will check a BNP to rule-out HF in light of her hypertensive cardiomyopathy. However, she has no other clinical signs of HF. I will prescribe furosemide  to help relieve her edema. I will provide potassium to help prevent hypokalemia.      Relevant Medications   furosemide  (LASIX ) 20 MG tablet   potassium chloride  SA (KLOR-CON  M) 20 MEQ tablet   Other Relevant Orders   Basic metabolic  panel with GFR   Brain natriuretic peptide    No follow-ups on file.   Garnette CHRISTELLA Simpler, MD

## 2023-08-17 ENCOUNTER — Other Ambulatory Visit: Payer: Self-pay | Admitting: Family Medicine

## 2023-08-17 DIAGNOSIS — R600 Localized edema: Secondary | ICD-10-CM

## 2023-10-12 ENCOUNTER — Ambulatory Visit: Admitting: Family Medicine

## 2023-10-24 ENCOUNTER — Encounter: Payer: Self-pay | Admitting: Family Medicine

## 2023-10-24 ENCOUNTER — Ambulatory Visit (INDEPENDENT_AMBULATORY_CARE_PROVIDER_SITE_OTHER): Admitting: Family Medicine

## 2023-10-24 VITALS — BP 124/80 | HR 76 | Temp 97.6°F | Ht 66.0 in | Wt 245.2 lb

## 2023-10-24 DIAGNOSIS — B351 Tinea unguium: Secondary | ICD-10-CM | POA: Diagnosis not present

## 2023-10-24 DIAGNOSIS — Z6841 Body Mass Index (BMI) 40.0 and over, adult: Secondary | ICD-10-CM

## 2023-10-24 DIAGNOSIS — R600 Localized edema: Secondary | ICD-10-CM | POA: Diagnosis not present

## 2023-10-24 DIAGNOSIS — I471 Supraventricular tachycardia, unspecified: Secondary | ICD-10-CM

## 2023-10-24 DIAGNOSIS — I422 Other hypertrophic cardiomyopathy: Secondary | ICD-10-CM | POA: Diagnosis not present

## 2023-10-24 DIAGNOSIS — I8392 Asymptomatic varicose veins of left lower extremity: Secondary | ICD-10-CM | POA: Insufficient documentation

## 2023-10-24 DIAGNOSIS — F331 Major depressive disorder, recurrent, moderate: Secondary | ICD-10-CM

## 2023-10-24 DIAGNOSIS — I10 Essential (primary) hypertension: Secondary | ICD-10-CM | POA: Diagnosis not present

## 2023-10-24 DIAGNOSIS — Z23 Encounter for immunization: Secondary | ICD-10-CM

## 2023-10-24 MED ORDER — VENLAFAXINE HCL ER 37.5 MG PO CP24: 37.5000 mg | ORAL_CAPSULE | Freq: Every day | ORAL | 3 refills | Status: AC

## 2023-10-24 MED ORDER — TERBINAFINE HCL 250 MG PO TABS: ORAL_TABLET | ORAL | 0 refills | Status: AC

## 2023-10-24 MED ORDER — METOPROLOL SUCCINATE ER 50 MG PO TB24: 25.0000 mg | ORAL_TABLET | Freq: Every day | ORAL | 3 refills | Status: AC

## 2023-10-24 NOTE — Assessment & Plan Note (Signed)
 Katherine Parker had stopped her medicine, has had some recurrence of depressive symptoms. I recommend we restart venlafaxine  37.5 mg daily.

## 2023-10-24 NOTE — Assessment & Plan Note (Signed)
 No current sign of heart failure. I think it will be beneficial for her to stay on metoprolol  succinate 50 mg 1/2 tab (25 mg) daily.

## 2023-10-24 NOTE — Assessment & Plan Note (Signed)
 Maximum weight: 380 lbs (02/2022) Current weight: 245 lbs Weight change since last visit: - 22 lbs Total weight loss: - 135 lbs (35.5 %)  Katherine Parker continues to make great strides in her weight loss. She is regularly exercising and has periods of fasting. She now has some loose skin issues that are causing irritation under the folds. I will refer her to plastic surgery to consider possible skin removal.

## 2023-10-24 NOTE — Assessment & Plan Note (Signed)
 Despite dramatic weight loss, Katherine Parker continues to have large fatty deposits of the hips and lower legs. I will refer her to plastic surgery to consider possible liposuction.

## 2023-10-24 NOTE — Progress Notes (Signed)
 [facility] [address] [place] [address] Dept: [phone] Dept Fax: [phone]  Chronic Care Office Visit  Subjective:    Patient ID: Katherine Parker, female    DOB: 1965-01-17, 58 y.o..   MRN: [id number]  Chief Complaint  Patient presents with   Hypertension    F/u HTN.  C/o vein on LT leg (lower)  and stomach (skin).     History of Present Illness:  Patient is in today for reassessment of chronic medical issues.  Katherine Parker has a history of hypertension. She has been managed on metoprolol  50 mg 1/2 tab (25 mg) daily for this and for a history of SVT. She had been noted to have some hypertrophic cardiomyopathy with LVH on an echocardiogram in 2022. She has also been on amlodipine  5 mg daily. She noes that she stopped takign several of her medicines. Despite this her BP has been normal, likely due to her weight loss. Her tachycardia does give her trouble occasionally due to stressors with her daughter.   Katherine Parker has a history of depression. She had been managed on venlafaxine  XR 225 mg (150 mg + 75 mg) daily and buspirone  10 mg twice a day as needed. She had stopped these medicines. However, she does admit that she is having some of her depressive and anxious feelings resurface.  Katherine Parker has a history of obesity. She continues to lose weight, which she attributes to periods of fasting and prayer. She is startign to have irritaiton under the loose skin folds of her abdomen and has noted varicose veins on her left calf. These have not been painful.  Past Medical History: Patient Active Problem List   Diagnosis Date Noted   Lower leg edema 07/24/2023   Lipedema of lower extremity 07/24/2023   Sialorrhea 07/12/2023   Allergic rhinitis 04/12/2023   Onychomycosis 01/12/2023   Chronic post-traumatic headache, not intractable 08/04/2022   Fibroadenoma of breast determined by biopsy 08/04/2022   Gastroesophageal reflux  disease without esophagitis 05/18/2022   Chronic constipation 12/08/2021   History of bulimia nervosa 12/08/2021   Vitiligo 07/27/2021   Obstructive sleep apnea 03/24/2021   Depression with anxiety 03/24/2021   Stutter 03/24/2021   Paroxysmal SVT (supraventricular tachycardia) 11/18/2020   Spondylolisthesis at L5-S1 level 10/20/2020   Prediabetes 10/20/2020   Hypertrophic cardiomyopathy (HCC) 10/13/2020   Morbid obesity- Max BMI 59.9 10/13/2020   Essential hypertension 10/13/2020   Chronic right-sided low back pain with right-sided sciatica 10/13/2020   Borderline hyperlipidemia 03/09/2020   Insomnia 03/09/2020   Mild intermittent asthma 03/09/2020   Moderate recurrent major depression (HCC) 03/09/2020   Past Surgical History:  Procedure Laterality Date   BREAST BIOPSY Right 08/01/2022   US  RT BREAST BX W LOC DEV 1ST LESION IMG BX SPEC US  GUIDE 08/01/2022 [facility] MAMMOGRAPHY   CESAREAN SECTION     GUM SURGERY     TUBAL LIGATION     Family History  Problem Relation Age of Onset   Heart disease Mother    COPD Mother    Congestive Heart Failure Mother    Hypertension Mother    Diabetes Mother    Dementia Father    Breast cancer Sister    Colitis Sister    Diabetes Maternal Grandmother    Colon cancer Maternal Grandmother    Dementia Paternal Grandmother    Dementia Paternal Grandfather    Crohn's disease Daughter    Cancer Half-Sister  Breast, brain metastasis   Cancer Half-Sister        Uterine   Colon cancer Other        mat great gm   Esophageal cancer Maternal Aunt    Outpatient Medications Prior to Visit  Medication Sig Dispense Refill   albuterol  (PROVENTIL ) (2.5 MG/3ML) 0.083% nebulizer solution Take 3 mLs (2.5 mg total) by nebulization every 6 (six) hours as needed for wheezing or shortness of breath. 75 mL 12   albuterol  (VENTOLIN  HFA) 108 (90 Base) MCG/ACT inhaler INHALE 1-2 PUFFS BY MOUTH EVERY 6 HOURS AS NEEDED FOR WHEEZE OR SHORTNESS OF BREATH 18 each  1   cetirizine  (ZYRTEC ) 10 MG tablet Take 1 tablet (10 mg total) by mouth daily as needed for allergies or rhinitis. 90 tablet 3   clotrimazole -betamethasone  (LOTRISONE ) cream Apply 1 Application topically daily. 30 g 0   fluticasone  (FLONASE ) 50 MCG/ACT nasal spray Place 2 sprays into both nostrils daily. 16 g 6   furosemide  (LASIX ) 20 MG tablet TAKE 1 TABLET (20 MG TOTAL) BY MOUTH DAILY AS NEEDED FOR EDEMA. 90 tablet 1   ibuprofen (ADVIL) 200 MG tablet Take 400 mg by mouth every 6 (six) hours as needed for mild pain.     omeprazole  (PRILOSEC) 40 MG capsule Take 1 capsule (40 mg total) by mouth daily. 90 capsule 3   ondansetron  (ZOFRAN -ODT) 4 MG disintegrating tablet Take 1 tablet (4 mg total) by mouth every 8 (eight) hours as needed. 20 tablet 0   amLODipine  (NORVASC ) 5 MG tablet Take 1 tablet (5 mg total) by mouth daily. (Patient not taking: Reported on 10/24/2023) 90 tablet 3   busPIRone  (BUSPAR ) 10 MG tablet Take 1 tablet (10 mg total) by mouth 2 (two) times daily. (Patient not taking: Reported on 10/24/2023) 180 tablet 3   metoprolol  succinate (TOPROL  XL) 50 MG 24 hr tablet Take 0.5 tablets (25 mg total) by mouth daily. (Patient not taking: Reported on 10/24/2023) 90 tablet 3   potassium chloride  SA (KLOR-CON  M) 20 MEQ tablet TAKE 1 TABLET (20 MEQ TOTAL) BY MOUTH DAILY. TAKE ON DAYS THAT YOU TAKE FUROSEMIDE . (Patient not taking: Reported on 10/24/2023) 90 tablet 1   prochlorperazine  (COMPAZINE ) 5 MG tablet Take 1 tablet (5 mg total) by mouth every 6 (six) hours as needed for nausea or vomiting. (Patient not taking: Reported on 10/24/2023) 30 tablet 0   terbinafine  (LAMISIL ) 250 MG tablet Take 1 tablet (250 mg total) by mouth daily. (Patient not taking: Reported on 10/24/2023) 90 tablet 0   venlafaxine  XR (EFFEXOR  XR) 75 MG 24 hr capsule Take 1 capsule (75 mg total) by mouth daily with breakfast. Take togther with 150 mg dose (total dose= 225 mg) (Patient not taking: Reported on 10/24/2023) 90  capsule 3   venlafaxine  XR (EFFEXOR -XR) 150 MG 24 hr capsule Take 1 capsule (150 mg total) by mouth daily with breakfast. Take together with 75 mg dose (total dose= 225 mg) (Patient not taking: Reported on 10/24/2023) 90 capsule 3   No facility-administered medications prior to visit.   Allergies  Allergen Reactions   Penicillins Other (See Comments)    Patient is unsure if she is still allergic to this in 2024- from childhood    Objective:   Today's Vitals   10/24/23 1303  BP: 124/80  Pulse: 76  Temp: 97.6 F (36.4 C)  TempSrc: Temporal  SpO2: 100%  Weight: 245 lb 3.2 oz (111.2 kg)  Height: 5' 6 (1.676 m)   Body mass  index is 39.58 kg/m.   General: Well developed, well nourished. No acute distress. Ms. Authier has a disproportional distribution of fat on her   body. Her upper body is now slim, but with loose skin on the arms and abdomen. However, her lower body continues to   show significant fat deposition around the nips and lower extremities. Extremities: There is a cluster of ropy superficial veins over the lateral aspect of the left lower leg. No redness or   tenderness. Psych: Alert and oriented. Normal mood and affect.  Health Maintenance Due  Topic Date Due   HIV Screening  Never done   Hepatitis C Screening  Never done   Hepatitis B Vaccines 19-59 Average Risk (1 of 3 - 19+ 3-dose series) Never done   Influenza Vaccine  08/04/2023     Assessment & Plan:   Problem List Items Addressed This Visit       Cardiovascular and Mediastinum   Essential hypertension   BP is at goal. I will have her resume metoprolol  succinate 50 mg 1/2 tab daily. I support her stopping amlodipine . Her weight loss has helped to improve her BP.      Relevant Medications   metoprolol  succinate (TOPROL  XL) 50 MG 24 hr tablet   Hypertrophic cardiomyopathy (HCC)   No current sign of heart failure. I think it will be beneficial for her to stay on metoprolol  succinate 50 mg 1/2 tab (25 mg)  daily.      Relevant Medications   metoprolol  succinate (TOPROL  XL) 50 MG 24 hr tablet   Paroxysmal SVT (supraventricular tachycardia)   Ms. Wilmeth has noted some increased episodes of tachycardia since stopping her metoprolol . I will have her resume this medicine.      Relevant Medications   metoprolol  succinate (TOPROL  XL) 50 MG 24 hr tablet     Musculoskeletal and Integument   Lipedema of lower extremity   Despite dramatic weight loss, Ms. Persing continues to have large fatty deposits of the hips and lower legs. I will refer her to plastic surgery to consider possible liposuction.      Relevant Orders   Ambulatory referral to Plastic Surgery   Onychomycosis   There has been some thinning of the nails, but there remains residual fungus. I will treat her with a 6 month course of terbinafine  as pulsed therapy.      Relevant Medications   terbinafine  (LAMISIL ) 250 MG tablet     Other   Moderate recurrent major depression (HCC)   Ms. Fuelling had stopped her medicine, has had some recurrence of depressive symptoms. I recommend we restart venlafaxine  37.5 mg daily.      Relevant Medications   venlafaxine  XR (EFFEXOR  XR) 37.5 MG 24 hr capsule   Morbid obesity- Max BMI 59.9 - Primary   Maximum weight: 380 lbs (02/2022) Current weight: 245 lbs Weight change since last visit: - 22 lbs Total weight loss: - 135 lbs (35.5 %)  Ms. Boateng continues to make great strides in her weight loss. She is regularly exercising and has periods of fasting. She now has some loose skin issues that are causing irritation under the folds. I will refer her to plastic surgery to consider possible skin removal.      Relevant Orders   Ambulatory referral to Plastic Surgery   Other Visit Diagnoses       Need for immunization against influenza       Relevant Orders   Flu vaccine trivalent PF, 6mos and  older(Flulaval,Afluria,Fluarix,Fluzone) (Completed)       Return in about 3 months (around 01/24/2024)  for Reassessment.   Garnette CHRISTELLA Simpler, MD

## 2023-10-24 NOTE — Assessment & Plan Note (Signed)
 BP is at goal. I will have her resume metoprolol  succinate 50 mg 1/2 tab daily. I support her stopping amlodipine . Her weight loss has helped to improve her BP.

## 2023-10-24 NOTE — Assessment & Plan Note (Signed)
 Katherine Parker has noted some increased episodes of tachycardia since stopping her metoprolol . I will have her resume this medicine.

## 2023-10-24 NOTE — Assessment & Plan Note (Signed)
 There has been some thinning of the nails, but there remains residual fungus. I will treat her with a 6 month course of terbinafine  as pulsed therapy.

## 2024-01-10 ENCOUNTER — Institutional Professional Consult (permissible substitution)

## 2024-01-24 ENCOUNTER — Ambulatory Visit: Payer: Self-pay | Admitting: Family Medicine

## 2024-01-24 ENCOUNTER — Encounter: Payer: Self-pay | Admitting: Family Medicine

## 2024-01-24 ENCOUNTER — Ambulatory Visit: Admitting: Family Medicine

## 2024-01-24 VITALS — BP 124/72 | HR 83 | Temp 98.1°F | Ht 66.0 in | Wt 236.2 lb

## 2024-01-24 DIAGNOSIS — L659 Nonscarring hair loss, unspecified: Secondary | ICD-10-CM | POA: Diagnosis not present

## 2024-01-24 DIAGNOSIS — Z6841 Body Mass Index (BMI) 40.0 and over, adult: Secondary | ICD-10-CM

## 2024-01-24 DIAGNOSIS — R600 Localized edema: Secondary | ICD-10-CM

## 2024-01-24 DIAGNOSIS — I422 Other hypertrophic cardiomyopathy: Secondary | ICD-10-CM

## 2024-01-24 DIAGNOSIS — I10 Essential (primary) hypertension: Secondary | ICD-10-CM | POA: Diagnosis not present

## 2024-01-24 DIAGNOSIS — Z1159 Encounter for screening for other viral diseases: Secondary | ICD-10-CM | POA: Diagnosis not present

## 2024-01-24 DIAGNOSIS — F331 Major depressive disorder, recurrent, moderate: Secondary | ICD-10-CM | POA: Diagnosis not present

## 2024-01-24 LAB — COMPREHENSIVE METABOLIC PANEL WITH GFR
ALT: 8 U/L (ref 3–35)
AST: 20 U/L (ref 5–37)
Albumin: 4.3 g/dL (ref 3.5–5.2)
Alkaline Phosphatase: 75 U/L (ref 39–117)
BUN: 9 mg/dL (ref 6–23)
CO2: 29 meq/L (ref 19–32)
Calcium: 10 mg/dL (ref 8.4–10.5)
Chloride: 104 meq/L (ref 96–112)
Creatinine, Ser: 0.65 mg/dL (ref 0.40–1.20)
GFR: 95.85 mL/min
Glucose, Bld: 86 mg/dL (ref 70–99)
Potassium: 3.7 meq/L (ref 3.5–5.1)
Sodium: 140 meq/L (ref 135–145)
Total Bilirubin: 0.7 mg/dL (ref 0.2–1.2)
Total Protein: 7.4 g/dL (ref 6.0–8.3)

## 2024-01-24 LAB — TSH: TSH: 0.3 u[IU]/mL — ABNORMAL LOW (ref 0.35–5.50)

## 2024-01-24 NOTE — Assessment & Plan Note (Signed)
 Maximum weight: 380 lbs (02/2022) Current weight: 236 lbs Weight change since last visit: - 9 lbs Total weight loss: - 144 lbs (37.9 %)  Katherine Parker continues to make great strides in her weight loss. She is regularly exercising and has periods of fasting. She now has some loose skin issues that are causing irritation under the folds. Considering surgery for skin fold removal and liposuction of lipedema.

## 2024-01-24 NOTE — Assessment & Plan Note (Signed)
 I will check labs for possible contributors. I suspect this may be either female pattern hair loss, but cannot exclude central centrifugal cicatricial alopecia. I do not feel that traction alopecia explains this. I will refer her to dermatology for further evaluation.

## 2024-01-24 NOTE — Assessment & Plan Note (Signed)
 BP is at goal. Continue metoprolol  succinate 50 mg 1/2 tab daily.

## 2024-01-24 NOTE — Assessment & Plan Note (Signed)
 Despite dramatic weight loss, Katherine Parker continues to have large fatty deposits of the hips and lower legs. She continues to consider liposuction. Now that her BMI is < 40, this would reduce potential complications of this procedure.

## 2024-01-24 NOTE — Assessment & Plan Note (Signed)
Stable.  Continue venlafaxine 37.5 mg daily.

## 2024-01-24 NOTE — Assessment & Plan Note (Signed)
 No current sign of heart failure.Continue metoprolol  succinate 50 mg 1/2 tab (25 mg) daily.

## 2024-01-24 NOTE — Progress Notes (Signed)
 " [facility] [address] [place] [address] Dept: [phone] Dept Fax: [phone]  Chronic Care Office Visit  Subjective:    Patient ID: Katherine Parker, female    DOB: 1965/08/12, 59 y.o..   MRN: [id number]  Chief Complaint  Patient presents with   Obesity    3 month f/u Weight.  (Down 9 lbs.) no concerns.     History of Present Illness:  Patient is in today for reassessment of chronic medical conditions.   Katherine Parker has a history of hypertension. She has been managed on metoprolol  50 mg 1/2 tab (25 mg) daily for this and for a history of SVT. She had been noted to have some hypertrophic cardiomyopathy with LVH on an echocardiogram in 2022. She has been able to stop other antihypertensives, as her BP improved with her weight loss.   Katherine Parker has a history of depression. She is managed on venlafaxine  XR 37.5 mg) daily. her mood is doing better overall. She feels her interactions with her daughter are still difficult at times. Through her faith in God, she feels she is managing this better.   Katherine Parker has a history of obesity. She continues to lose weight, which she attributes to periods of fasting and prayer. She has a history of lipidema, esp. involving the lower extremities. I had referred her to plastic surgery to consider liposuction, but she notes she is not quite ready to take this step.  Ms. notes an issue with hair loss/thinning on the apex of her head. She is currently waring braid extensions, but she noted the issue prior to this. She has had other women in her family lose hair with time. She has not noted any specific skin rash associated with this.  Past Medical History: Patient Active Problem List   Diagnosis Date Noted   Varicose veins of left lower extremity 10/24/2023   Lower leg edema 07/24/2023   Lipedema of lower extremity 07/24/2023   Sialorrhea 07/12/2023   Allergic rhinitis 04/12/2023    Onychomycosis 01/12/2023   Chronic post-traumatic headache, not intractable 08/04/2022   Fibroadenoma of breast determined by biopsy 08/04/2022   Gastroesophageal reflux disease without esophagitis 05/18/2022   Chronic constipation 12/08/2021   History of bulimia nervosa 12/08/2021   Vitiligo 07/27/2021   Obstructive sleep apnea 03/24/2021   Stutter 03/24/2021   Paroxysmal SVT (supraventricular tachycardia) 11/18/2020   Spondylolisthesis at L5-S1 level 10/20/2020   Prediabetes 10/20/2020   Hypertrophic cardiomyopathy (HCC) 10/13/2020   Morbid obesity- Max BMI 59.9 10/13/2020   Essential hypertension 10/13/2020   Chronic right-sided low back pain with right-sided sciatica 10/13/2020   Borderline hyperlipidemia 03/09/2020   Insomnia 03/09/2020   Mild intermittent asthma 03/09/2020   Moderate recurrent major depression (HCC) 03/09/2020   Past Surgical History:  Procedure Laterality Date   BREAST BIOPSY Right 08/01/2022   US  RT BREAST BX W LOC DEV 1ST LESION IMG BX SPEC US  GUIDE 08/01/2022 [facility] MAMMOGRAPHY   CESAREAN SECTION     GUM SURGERY     TUBAL LIGATION     Family History  Problem Relation Age of Onset   Heart disease Mother    COPD Mother    Congestive Heart Failure Mother    Hypertension Mother    Diabetes Mother    Dementia Father    Breast cancer Sister    Colitis Sister    Diabetes Maternal Grandmother    Colon cancer Maternal Grandmother    Dementia Paternal Grandmother  Dementia Paternal Grandfather    Crohn's disease Daughter    Cancer Half-Sister        Breast, brain metastasis   Cancer Half-Sister        Uterine   Colon cancer Other        mat great gm   Esophageal cancer Maternal Aunt    Outpatient Medications Prior to Visit  Medication Sig Dispense Refill   albuterol  (PROVENTIL ) (2.5 MG/3ML) 0.083% nebulizer solution Take 3 mLs (2.5 mg total) by nebulization every 6 (six) hours as needed for wheezing or shortness of breath. 75 mL 12   albuterol   (VENTOLIN  HFA) 108 (90 Base) MCG/ACT inhaler INHALE 1-2 PUFFS BY MOUTH EVERY 6 HOURS AS NEEDED FOR WHEEZE OR SHORTNESS OF BREATH 18 each 1   cetirizine  (ZYRTEC ) 10 MG tablet Take 1 tablet (10 mg total) by mouth daily as needed for allergies or rhinitis. 90 tablet 3   clotrimazole -betamethasone  (LOTRISONE ) cream Apply 1 Application topically daily. 30 g 0   fluticasone  (FLONASE ) 50 MCG/ACT nasal spray Place 2 sprays into both nostrils daily. 16 g 6   furosemide  (LASIX ) 20 MG tablet TAKE 1 TABLET (20 MG TOTAL) BY MOUTH DAILY AS NEEDED FOR EDEMA. 90 tablet 1   ibuprofen (ADVIL) 200 MG tablet Take 400 mg by mouth every 6 (six) hours as needed for mild pain.     metoprolol  succinate (TOPROL  XL) 50 MG 24 hr tablet Take 0.5 tablets (25 mg total) by mouth daily. 90 tablet 3   omeprazole  (PRILOSEC) 40 MG capsule Take 1 capsule (40 mg total) by mouth daily. 90 capsule 3   ondansetron  (ZOFRAN -ODT) 4 MG disintegrating tablet Take 1 tablet (4 mg total) by mouth every 8 (eight) hours as needed. 20 tablet 0   terbinafine  (LAMISIL ) 250 MG tablet Take 1 tablet (250 mg total) by mouth daily for 7 days. Repeat every 4 weeks for 6 months. 42 tablet 0   venlafaxine  XR (EFFEXOR  XR) 37.5 MG 24 hr capsule Take 1 capsule (37.5 mg total) by mouth daily with breakfast. 90 capsule 3   No facility-administered medications prior to visit.   Allergies[1]   Objective:   Today's Vitals   01/24/24 0955  BP: 124/72  Pulse: 83  Temp: 98.1 F (36.7 C)  TempSrc: Temporal  SpO2: 98%  Weight: 236 lb 3.2 oz (107.1 kg)  Height: 5' 6 (1.676 m)   Body mass index is 38.12 kg/m.   General: Well developed, well nourished. No acute distress. Psych: Alert and oriented. Normal mood and affect.  Health Maintenance Due  Topic Date Due   HIV Screening  Never done   Hepatitis C Screening  Never done   COVID-19 Vaccine (3 - Pfizer risk series) 04/30/2019   Medicare Annual Wellness (AWV)  02/27/2024     Assessment & Plan:    Problem List Items Addressed This Visit       Cardiovascular and Mediastinum   Essential hypertension - Primary   BP is at goal. Continue metoprolol  succinate 50 mg 1/2 tab daily.      Hypertrophic cardiomyopathy (HCC)   No current sign of heart failure.Continue metoprolol  succinate 50 mg 1/2 tab (25 mg) daily.        Musculoskeletal and Integument   Lipedema of lower extremity   Despite dramatic weight loss, Katherine Parker continues to have large fatty deposits of the hips and lower legs. She continues to consider liposuction. Now that her BMI is < 40, this would reduce potential complications of  this procedure.        Other   Hair loss   I will check labs for possible contributors. I suspect this may be either female pattern hair loss, but cannot exclude central centrifugal cicatricial alopecia. I do not feel that traction alopecia explains this. I will refer her to dermatology for further evaluation.      Relevant Orders   Ambulatory referral to Dermatology   Comprehensive metabolic panel with GFR   TSH   Moderate recurrent major depression (HCC)   Stable. Continue venlafaxine  37.5 mg daily.      Morbid obesity- Max BMI 59.9   Maximum weight: 380 lbs (02/2022) Current weight: 236 lbs Weight change since last visit: - 9 lbs Total weight loss: - 144 lbs (37.9 %)  Katherine Parker continues to make great strides in her weight loss. She is regularly exercising and has periods of fasting. She now has some loose skin issues that are causing irritation under the folds. Considering surgery for skin fold removal and liposuction of lipedema.      Other Visit Diagnoses       Encounter for hepatitis C screening test for low risk patient       Relevant Orders   HCV Ab w Reflex to Quant PCR       Return in about 4 months (around 05/23/2024).   Garnette CHRISTELLA Simpler, MD  I,Emily Lagle,acting as a scribe for Garnette CHRISTELLA Simpler, MD.,have documented all relevant documentation on the behalf of  Garnette CHRISTELLA Simpler, MD.  I, Garnette CHRISTELLA Simpler, MD, have reviewed all documentation for this visit. The documentation on 01/24/2024 for the exam, diagnosis, procedures, and orders are all accurate and complete.      [1]  Allergies Allergen Reactions   Penicillins Other (See Comments)    Patient is unsure if she is still allergic to this in 2024- from childhood    "

## 2024-01-25 LAB — HCV AB W REFLEX TO QUANT PCR: HCV Ab: NONREACTIVE

## 2024-01-25 LAB — HCV INTERPRETATION

## 2024-03-01 ENCOUNTER — Ambulatory Visit: Payer: Medicare Other

## 2024-03-05 ENCOUNTER — Ambulatory Visit

## 2024-05-23 ENCOUNTER — Ambulatory Visit: Admitting: Family Medicine
